# Patient Record
Sex: Female | Born: 1935 | Race: Black or African American | Hispanic: No | Smoking: Never smoker
Health system: Southern US, Community
[De-identification: ages and names within clinical notes are randomized; demographics above are authoritative.]

## PROBLEM LIST (undated history)

## (undated) DIAGNOSIS — N183 Chronic kidney disease, stage 3 unspecified: Secondary | ICD-10-CM

## (undated) DIAGNOSIS — D649 Anemia, unspecified: Secondary | ICD-10-CM

## (undated) DIAGNOSIS — I447 Left bundle-branch block, unspecified: Secondary | ICD-10-CM

## (undated) DIAGNOSIS — I38 Endocarditis, valve unspecified: Secondary | ICD-10-CM

## (undated) DIAGNOSIS — I5032 Chronic diastolic (congestive) heart failure: Secondary | ICD-10-CM

## (undated) DIAGNOSIS — L299 Pruritus, unspecified: Secondary | ICD-10-CM

## (undated) DIAGNOSIS — I10 Essential (primary) hypertension: Secondary | ICD-10-CM

## (undated) DIAGNOSIS — K219 Gastro-esophageal reflux disease without esophagitis: Secondary | ICD-10-CM

## (undated) DIAGNOSIS — E785 Hyperlipidemia, unspecified: Secondary | ICD-10-CM

## (undated) DIAGNOSIS — I639 Cerebral infarction, unspecified: Secondary | ICD-10-CM

---

## 2004-10-17 ENCOUNTER — Emergency Department: Payer: Self-pay | Admitting: General Practice

## 2005-06-06 ENCOUNTER — Ambulatory Visit: Payer: Self-pay | Admitting: Internal Medicine

## 2005-07-19 ENCOUNTER — Ambulatory Visit: Payer: Self-pay | Admitting: Internal Medicine

## 2006-04-09 ENCOUNTER — Emergency Department: Payer: Self-pay | Admitting: Unknown Physician Specialty

## 2011-04-10 ENCOUNTER — Ambulatory Visit: Payer: Self-pay | Admitting: Internal Medicine

## 2016-04-19 ENCOUNTER — Ambulatory Visit
Admission: RE | Admit: 2016-04-19 | Discharge: 2016-04-19 | Disposition: A | Discharge status: Home / Self Care | Payer: Medicare Other | Source: Ambulatory Visit | Attending: Internal Medicine | Admitting: Internal Medicine

## 2016-04-19 ENCOUNTER — Other Ambulatory Visit: Payer: Self-pay | Admitting: Internal Medicine

## 2016-04-19 DIAGNOSIS — R600 Localized edema: Secondary | ICD-10-CM | POA: Insufficient documentation

## 2019-10-29 ENCOUNTER — Emergency Department (HOSPITAL_COMMUNITY): Payer: Medicare Other

## 2019-10-29 ENCOUNTER — Encounter (HOSPITAL_COMMUNITY): Payer: Self-pay | Admitting: Emergency Medicine

## 2019-10-29 ENCOUNTER — Emergency Department (HOSPITAL_COMMUNITY)
Admission: EM | Admit: 2019-10-29 | Discharge: 2019-10-29 | Disposition: A | Discharge status: Other Acute Inpatient Hospital | Payer: Medicare Other | Attending: Emergency Medicine | Admitting: Emergency Medicine

## 2019-10-29 DIAGNOSIS — I639 Cerebral infarction, unspecified: Secondary | ICD-10-CM

## 2019-10-29 DIAGNOSIS — R29726 NIHSS score 26: Secondary | ICD-10-CM | POA: Diagnosis not present

## 2019-10-29 DIAGNOSIS — I10 Essential (primary) hypertension: Secondary | ICD-10-CM | POA: Insufficient documentation

## 2019-10-29 DIAGNOSIS — Z20822 Contact with and (suspected) exposure to covid-19: Secondary | ICD-10-CM | POA: Diagnosis not present

## 2019-10-29 DIAGNOSIS — R531 Weakness: Secondary | ICD-10-CM | POA: Diagnosis present

## 2019-10-29 DIAGNOSIS — R479 Unspecified speech disturbances: Secondary | ICD-10-CM | POA: Insufficient documentation

## 2019-10-29 LAB — CBG MONITORING, ED: Glucose-Capillary: 121 mg/dL — ABNORMAL HIGH (ref 70–99)

## 2019-10-29 LAB — DIFFERENTIAL
Abs Immature Granulocytes: 0.03 10*3/uL (ref 0.00–0.07)
Basophils Absolute: 0.1 10*3/uL (ref 0.0–0.1)
Basophils Relative: 1 %
Eosinophils Absolute: 0.2 10*3/uL (ref 0.0–0.5)
Eosinophils Relative: 2 %
Immature Granulocytes: 0 %
Lymphocytes Relative: 48 %
Lymphs Abs: 4.6 10*3/uL — ABNORMAL HIGH (ref 0.7–4.0)
Monocytes Absolute: 0.7 10*3/uL (ref 0.1–1.0)
Monocytes Relative: 7 %
Neutro Abs: 3.9 10*3/uL (ref 1.7–7.7)
Neutrophils Relative %: 42 %

## 2019-10-29 LAB — CBC
HCT: 36.8 % (ref 36.0–46.0)
Hemoglobin: 11.7 g/dL — ABNORMAL LOW (ref 12.0–15.0)
MCH: 29 pg (ref 26.0–34.0)
MCHC: 31.8 g/dL (ref 30.0–36.0)
MCV: 91.3 fL (ref 80.0–100.0)
Platelets: 337 10*3/uL (ref 150–400)
RBC: 4.03 MIL/uL (ref 3.87–5.11)
RDW: 13.1 % (ref 11.5–15.5)
WBC: 9.4 10*3/uL (ref 4.0–10.5)
nRBC: 0 % (ref 0.0–0.2)

## 2019-10-29 LAB — COMPREHENSIVE METABOLIC PANEL
ALT: 18 U/L (ref 0–44)
AST: 31 U/L (ref 15–41)
Albumin: 4 g/dL (ref 3.5–5.0)
Alkaline Phosphatase: 49 U/L (ref 38–126)
Anion gap: 14 (ref 5–15)
BUN: 25 mg/dL — ABNORMAL HIGH (ref 8–23)
CO2: 22 mmol/L (ref 22–32)
Calcium: 9.3 mg/dL (ref 8.9–10.3)
Chloride: 94 mmol/L — ABNORMAL LOW (ref 98–111)
Creatinine, Ser: 1.37 mg/dL — ABNORMAL HIGH (ref 0.44–1.00)
GFR calc Af Amer: 38 mL/min — ABNORMAL LOW (ref >60)
GFR calc non Af Amer: 33 mL/min — ABNORMAL LOW (ref >60)
Glucose, Bld: 140 mg/dL — ABNORMAL HIGH (ref 70–99)
Potassium: 4.8 mmol/L (ref 3.5–5.1)
Sodium: 130 mmol/L — ABNORMAL LOW (ref 135–145)
Total Bilirubin: 1.8 mg/dL — ABNORMAL HIGH (ref 0.3–1.2)
Total Protein: 7.1 g/dL (ref 6.5–8.1)

## 2019-10-29 LAB — PROTIME-INR
INR: 1.1 (ref 0.8–1.2)
Prothrombin Time: 13.8 seconds (ref 11.4–15.2)

## 2019-10-29 LAB — I-STAT CHEM 8, ED
BUN: 26 mg/dL — ABNORMAL HIGH (ref 8–23)
Calcium, Ion: 1.13 mmol/L — ABNORMAL LOW (ref 1.15–1.40)
Chloride: 98 mmol/L (ref 98–111)
Creatinine, Ser: 1.4 mg/dL — ABNORMAL HIGH (ref 0.44–1.00)
Glucose, Bld: 136 mg/dL — ABNORMAL HIGH (ref 70–99)
HCT: 37 % (ref 36.0–46.0)
Hemoglobin: 12.6 g/dL (ref 12.0–15.0)
Potassium: 4.6 mmol/L (ref 3.5–5.1)
Sodium: 132 mmol/L — ABNORMAL LOW (ref 135–145)
TCO2: 28 mmol/L (ref 22–32)

## 2019-10-29 LAB — APTT: aPTT: 28 seconds (ref 24–36)

## 2019-10-29 LAB — RESPIRATORY PANEL BY RT PCR (FLU A&B, COVID)
Influenza A by PCR: NEGATIVE
Influenza B by PCR: NEGATIVE
SARS Coronavirus 2 by RT PCR: NEGATIVE

## 2019-10-29 LAB — ETHANOL: Alcohol, Ethyl (B): <10 mg/dL (ref <10)

## 2019-10-29 MED ORDER — SODIUM CHLORIDE 0.9 % IV SOLN
50.0000 mL | Freq: Once | INTRAVENOUS | Status: AC
  Administered 2019-10-29: 50 mL via INTRAVENOUS

## 2019-10-29 MED ORDER — IOHEXOL 350 MG/ML SOLN
100.0000 mL | Freq: Once | INTRAVENOUS | Status: AC | PRN
  Administered 2019-10-29: 100 mL via INTRAVENOUS

## 2019-10-29 MED ORDER — ALTEPLASE (STROKE) FULL DOSE INFUSION
0.9000 mg/kg | Freq: Once | INTRAVENOUS | Status: AC
  Administered 2019-10-29: 44.5 mg via INTRAVENOUS
  Filled 2019-10-29: qty 100

## 2019-10-29 NOTE — ED Provider Notes (Addendum)
[facility] EMERGENCY DEPARTMENT Provider Note   CSN: [id number] Arrival date & time: 10/29/19  1017     History stroke  Sarah Delacruz is a 86 y.o. adult.  HPI    Patient presented to the ED for evaluation of acute stroke.  According to the EMS report the patient woke up normally this morning.  She had gotten up and was eating breakfast.  Patient had sudden onset of left-sided weakness as well as speech disturbance.  EMS activated a code stroke.  There was also concern of possible STEMI.  I personally reviewed the EKG this does not appear to be a STEMI.  PMHX . Anemia  . Cardiomyopathy, secondary (CMS-HCC)  . GERD (gastroesophageal reflux disease)  . Heart murmur, unspecified  . Hyperlipidemia  . Hypertension  . Left bundle branch block  . Valvular heart disease   Home meds: Current Outpatient Medications  Medication Sig Dispense Refill  . clotrimazole-betamethasone (LOTRISONE) 1-0.05 % cream Apply topically 2 (two) times daily 15 g 3  . cyanocobalamin (VITAMIN B12) 500 MCG tablet TAKE 1 TABLET BY MOUTH DAILY 30 tablet 5  . hydroCHLOROthiazide (MICROZIDE) 12.5 mg capsule TAKE 1 CAPSULE BY MOUTH DAILY 30 capsule 5  . hydrocortisone 2.5 % ointment Apply topically as needed.   . lovastatin (MEVACOR) 20 MG tablet TAKE 2 TABLETS BY MOUTH EVERY DAY WITH DINNER 60 tablet 5  . metoprolol succinate (TOPROL-XL) 50 MG XL tablet TAKE 1 TABLET BY MOUTH DAILY 90 tablet 1  . omeprazole (PRILOSEC) 20 MG DR capsule TAKE 1 CAPSULE BY MOUTH DAILY 90 capsule 1  . polyethylene glycol (MIRALAX) powder Take 17 g by mouth once daily Mix in 4-8ounces of fluid prior to taking.   Social and Family History  Social History reports that she has never smoked. She has never used smokeless tobacco. She reports that she does not drink alcohol.  Family History Family History  Problem Relation Age of Onset  . Cancer Mother  . Coronary Artery Disease (Blocked arteries around heart) Sister        OB History   No obstetric history on file.     History reviewed. No pertinent family history.  Social History   Tobacco Use  . Smoking status: Not on file  Substance Use Topics  . Alcohol use: Not on file  . Drug use: Not on file    Home Medications Prior to Admission medications   Not on File    Allergies    sulfa  Review of Systems   Review of Systems  All other systems reviewed and are negative.   Physical Exam Updated Vital Signs Wt 55 kg   Physical Exam Vitals and nursing note reviewed.  Constitutional:      Appearance: He is well-developed. He is ill-appearing.  HENT:     Head: Normocephalic and atraumatic.     Right Ear: External ear normal.     Left Ear: External ear normal.     Nose: No congestion or rhinorrhea.  Eyes:     General: No scleral icterus.       Right eye: No discharge.        Left eye: No discharge.     Conjunctiva/sclera: Conjunctivae normal.  Neck:     Trachea: No tracheal deviation.  Cardiovascular:     Rate and Rhythm: Normal rate and regular rhythm.  Pulmonary:     Effort: Pulmonary effort is normal. No respiratory distress.  Abdominal:     General: There  is no distension.     Tenderness: There is no abdominal tenderness.  Musculoskeletal:        General: No signs of injury.     Cervical back: No rigidity.  Skin:    General: Skin is warm and dry.     Findings: No rash.  Neurological:     Mental Status: He is alert.     Cranial Nerves: No cranial nerve deficit ( slurred speech ).     Sensory: Sensory deficit present.     Motor: No abnormal muscle tone or seizure activity.     Coordination: Coordination abnormal.     Comments: Left Sided hemiparesis, neglect, slurred speech     ED Results / Procedures / Treatments   Labs (all labs ordered are listed, but only abnormal results are displayed) Labs Reviewed  CBC - Abnormal; Notable for the following components:      Result Value   Hemoglobin 11.7 (*)    All  other components within normal limits  DIFFERENTIAL - Abnormal; Notable for the following components:   Lymphs Abs 4.6 (*)    All other components within normal limits  I-STAT CHEM 8, ED - Abnormal; Notable for the following components:   Sodium 132 (*)    BUN 26 (*)    Creatinine, Ser 1.40 (*)    Glucose, Bld 136 (*)    Calcium, Ion 1.13 (*)    All other components within normal limits  CBG MONITORING, ED - Abnormal; Notable for the following components:   Glucose-Capillary 121 (*)    All other components within normal limits  ETHANOL  PROTIME-INR  APTT  COMPREHENSIVE METABOLIC PANEL  RAPID URINE DRUG SCREEN, HOSP PERFORMED  URINALYSIS, ROUTINE W REFLEX MICROSCOPIC    EKG EKG Interpretation  Date/Time:  Wednesday October 29 2019 10:51:36 EST Ventricular Rate:  87 PR Interval:    QRS Duration: 130 QT Interval:  421 QTC Calculation: 507 R Axis:   -46 Text Interpretation: Age not entered, assumed to be  86 years old for purpose of ECG interpretation Sinus rhythm Atrial premature complexes Left bundle branch block increased t wave amplitude  since prior tracing Confirmed by Dorie Rank [phone]) on 10/29/2019 10:55:40 AM   Radiology No results found.  Procedures Procedures (including critical care time)  Medications Ordered in ED Medications  alteplase (ACTIVASE) 1 mg/mL infusion 49.5 mg (has no administration in time range)    Followed by  0.9 %  sodium chloride infusion (has no administration in time range)    ED Course  I have reviewed the triage vital signs and the nursing notes.  Pertinent labs & imaging results that were available during my care of the patient were reviewed by me and considered in my medical decision making (see chart for details).  Clinical Course as of Oct 28 1041  Wed Oct 29, 2019  1043 Code STEMI was deactivated.  Discussed with Dr. Burt Knack   [JK]    Clinical Course User Index [JK] Dorie Rank, MD   MDM Rules/Calculators/A&P                       Patient presented to the ED as a code stroke.  She was immediately met by the stroke team, with Dr. Lorraine Lax.  Plan is for IR intervention.  IR team here is caring for another patient.  Dr Aroor has arranged for transfer to [place] [place] for emergent treatment intervention.   Final Clinical Impression(s) / ED  Diagnoses Final diagnoses:  Cerebrovascular accident (CVA), unspecified mechanism ([place])      Dorie Rank, MD 10/29/19 1043      Dorie Rank, MD 10/29/19 1106

## 2019-10-29 NOTE — Consult Note (Addendum)
Requesting Physician: Dr. Lynelle Doctor    Chief Complaint: Left-sided weakness  History obtained from: EMS and Chart    HPI:                                                                                                                                       Sarah Delacruz is a 86 y.o. adult with past medical history of hyperlipidemia, hypertension, unspecified heart murmur, cardiomyopathy, valvular heart disease presents to the emergency department for sudden onset left-sided weakness.  Last known normal was 9 AM this morning.  According to her daughter, she was having her breakfast at 9 AM.  Shortly after when she went to check on her, she seemed to be not acting right and called EMS.  When EMS arrived they noted patient had left-sided weakness, right-sided gaze deviation and brought her to [facility] [address] as a code stroke.   ED course  On arrival.  NIH stroke scale was 24.  Blood pressure was 160 systolic.EKG reviewed by EDP, was abnormal but not concerning for STEMI.  CT head showed aspects of 8.  Patient was given IV TPA after discussion with 2 daughters over the phone.  CT angiogram was subsequently performed showed occlusion of both superior and inferior division M2  right MCA. CT perfusion showed a 14 mm core of penumbra of 79 mL.  Due to in availability of IR due to another case on the table, case was discussed with Dr. Lanna Poche at [facility] and patient was transferred for emergent mechanical thrombectomy.  The patient has baseline, according to family-walks with a cane due to arthritis, cooks, normal conversationd   Date last known well: 10/29/2019 Time last known well: 9 AM tPA Given: Yes NIHSS: 24 Baseline MRS 2   Past medical history -Above  Family history: cancer in mother Social history: Non-smoker does not drink alcohol  Allergies: Not on File  Medications:                                                                                                                         I reviewed home medications   ROS:  14 systems reviewed and negative except above    Examination:                                                                                                      General: Appears well-developed . Psych: Affect appropriate to situation Eyes: No scleral injection HENT: No OP obstrucion Head: Normocephalic.  Cardiovascular: Normal rate and regular rhythm. Respiratory: Effort normal and breath sounds normal to anterior ascultation GI: Soft.  No distension. There is no tenderness.  Skin: WDI    Neurological Examination Mental Status: Alert, not oriented to age or month.  Oriented to person.  Follows simple commands.  Dysarthric. Cranial Nerves: II: Visual fields: Left homonymous hemianopsia III,IV, VI: ptosis not present, right-sided gaze, pupils equal, round, reactive to light and accommodation V,VII: Left facial droop, XII: midline tongue extension Motor: Right : Upper extremity   4/5    Left:     Upper extremity   0/5  Lower extremity   3/5     Lower extremity   0/5 Tone and bulk:normal tone throughout; no atrophy noted Sensory: Withdraws to noxious stimulus on the right side, neglects left side Plantars: Right: downgoing   Left: downgoing Cerebellar: No obvious ataxia on the right side Gait: Unable to walk     Lab Results: Basic Metabolic Panel: Recent Labs  Lab 10/29/19 1023 10/29/19 1027  NA 130* 132*  K 4.8 4.6  CL 94* 98  CO2 22  --   GLUCOSE 140* 136*  BUN 25* 26*  CREATININE 1.37* 1.40*  CALCIUM 9.3  --     CBC: Recent Labs  Lab 10/29/19 1023 10/29/19 1027  WBC 9.4  --   NEUTROABS 3.9  --   HGB 11.7* 12.6  HCT 36.8 37.0  MCV 91.3  --   PLT 337  --     Coagulation Studies: Recent Labs    10/29/19 1023  LABPROT 13.8  INR 1.1    Imaging: CT HEAD CODE STROKE WO  CONTRAST  Result Date: 10/29/2019 CLINICAL DATA:  Code stroke. Left-sided weakness. Last known normal 9am. EXAM: CT HEAD WITHOUT CONTRAST TECHNIQUE: Contiguous axial images were obtained from the base of the skull through the vertex without intravenous contrast. COMPARISON:  No pertinent prior studies available for comparison. FINDINGS: Brain: There is cortical/subcortical hypodensity within the posterosuperior right temporal lobe consistent with acute/early subacute infarction. Small chronic appearing cortically based infarct within the mid right frontal lobe (series 3, image 23). Additional small cortically based infarct within the left parietal lobe (series 3, image 21). Chronic lacunar infarct within the right cerebellum. There is no evidence of acute intracranial hemorrhage. There is no evidence of intracranial mass. No midline shift or extra-axial fluid collection. Moderate/advanced ill-defined hypoattenuation within the cerebral white matter is nonspecific, but consistent with chronic small vessel ischemic disease. Moderate generalized parenchymal atrophy. Vascular: No hyperdense vessel.  Atherosclerotic calcifications. Skull: Normal. Negative for fracture or focal lesion. Sinuses/Orbits: Rightward gaze. Mild paranasal sinus mucosal thickening. No significant mastoid effusion ASPECTS [facility] Stroke Program Early CT Score) - Ganglionic level infarction (caudate,  lentiform nuclei, internal capsule, insula, M1-M3 cortex): 5 (points deducted for involvement of the M2 and M3 regions) - Supraganglionic infarction (M4-M6 cortex): 3 Total score (0-10 with 10 being normal): 8 These results were called by telephone at the time of interpretation on 10/29/2019 at 10:52 am to provider Dr. Laurence Slate, who verbally acknowledged these results. IMPRESSION: Acute/early subacute ischemic infarction within the right MCA vascular territory involving the posterosuperior temporal lobe. ASPECTS 8. Small chronic cortically based infarcts  within the mid right frontal lobe and left parietal lobe. Moderate to advanced chronic small vessel ischemic disease. Chronic appearing lacunar infarct within the right cerebellum. Electronically Signed   By: Jackey Loge DO   On: 10/29/2019 10:52     ASSESSMENT AND PLAN  86 y.o. adult with past medical history of hyperlipidemia, hypertension, unspecified heart murmur, cardiomyopathy, valvular heart disease presents to the emergency department for sudden onset left-sided weakness secondary to right MCA stroke due to large vessel occlusion-CT angiogram demonstrating both superior and inferior division branches of MCA occluded.   Plan Patient received IV TPA.  Due to another case on the table, patient was transferred to [facility] for emergent mechanical thrombectomy.    This patient is neurologically critically ill due right MCA stroke.  She is at risk for significant risk of neurological worsening from cerebral edema,  death from brain herniation, heart failure, hemorrhagic conversion, infection, respiratory failure and seizure. This patient's care requires constant monitoring of vital signs, hemodynamics, respiratory and cardiac monitoring, review of multiple databases, neurological assessment, discussion with family, other specialists and medical decision making of high complexity.  I spent 40  minutes of neurocritical time in the care of this patient.     Sushanth Aroor [facility] Pager Number [phone]

## 2019-10-29 NOTE — Code Documentation (Addendum)
86 yo female coming from home where she was with her family this morning. Pt was reported drinking coffee when she had a sudden onset of left sided weakness and right gaze. EMS called and activated a Code Stroke.   Stroke Team met patient upon arrival to the ED. Labs drawn and EDP cleared. CT Head completed and showed no hemorrhage. Initial NIHSS 26 - See Stroke Timeline for details. Verbal consent received by Aroor over the phone. 2nd IV started and tPA started at 1036. BP prior to tPA: 161/63. See MAR.    CTA/CTP completed and showed superior and inferior Right MCA M2. Pt needed to be transferred to [facility]. MD [facility] called at 1038. Approved transfer. [facility] called at 1042. Reported no trucks available at this time, but one would arrive at 1100. [employer] administrator called at [employer].   [facility] arrived at 28. Assessments and VS completed per protocol with bedside handoff. Post-tPA flush hung and patient transported to [facility]. EMTALA, Facesheet, and ED Record given to [facility]. [facility] left with patient at 1114.   Report given to Hildred Alamin, Therapist, sports at [employer].

## 2019-10-29 NOTE — Progress Notes (Signed)
Pharmacist Code Stroke Response  Notified to mix tPA at 1030 by Dr. Laurence Slate Delivered tPA to RN at 1033  tPA dose = 5mg  bolus over 1 minute followed by 44.5mg  for a total dose of 49.5mg  over 1 hour  Issues/delays encountered (if applicable): none   , PharmD, BCPS Please check AMION for all [facility] contact numbers Clinical Pharmacist 10/29/2019 10:40 AM

## 2019-11-04 ENCOUNTER — Encounter: Payer: Self-pay | Admitting: *Deleted

## 2019-11-04 MED ORDER — PRAVASTATIN SODIUM 40 MG PO TABS
40.00 | ORAL_TABLET | ORAL | Status: DC
Start: 2019-11-04 — End: 2019-11-04

## 2019-11-04 MED ORDER — DOCUSATE SODIUM 150 MG/15ML PO LIQD
100.00 | ORAL | Status: DC
Start: 2019-11-04 — End: 2019-11-04

## 2019-11-04 MED ORDER — ASPIRIN 325 MG PO TABS
325.00 | ORAL_TABLET | ORAL | Status: DC
Start: 2019-11-05 — End: 2019-11-04

## 2019-11-04 MED ORDER — SODIUM CHLORIDE 0.9 % IV SOLN
10.00 | INTRAVENOUS | Status: DC
Start: ? — End: 2019-11-04

## 2019-11-04 MED ORDER — LABETALOL HCL 5 MG/ML IV SOLN
10.00 | INTRAVENOUS | Status: DC
Start: ? — End: 2019-11-04

## 2019-11-04 MED ORDER — HYDRALAZINE HCL 10 MG PO TABS
10.00 | ORAL_TABLET | ORAL | Status: DC
Start: 2019-11-04 — End: 2019-11-04

## 2019-11-04 MED ORDER — METOPROLOL TARTRATE 25 MG PO TABS
12.50 | ORAL_TABLET | ORAL | Status: DC
Start: 2019-11-04 — End: 2019-11-04

## 2019-11-04 MED ORDER — ONDANSETRON HCL 4 MG/2ML IJ SOLN
4.00 | INTRAMUSCULAR | Status: DC
Start: ? — End: 2019-11-04

## 2019-11-04 MED ORDER — SENNOSIDES 8.8 MG/5ML PO SYRP
5.00 | ORAL_SOLUTION | ORAL | Status: DC
Start: 2019-11-04 — End: 2019-11-04

## 2019-11-04 MED ORDER — HYDRALAZINE HCL 20 MG/ML IJ SOLN
10.00 | INTRAMUSCULAR | Status: DC
Start: ? — End: 2019-11-04

## 2019-11-04 MED ORDER — SODIUM CHLORIDE 0.9 % IV SOLN
500.00 | INTRAVENOUS | Status: DC
Start: ? — End: 2019-11-04

## 2019-11-04 MED ORDER — GENERIC EXTERNAL MEDICATION
62.50 | Status: DC
Start: 2019-11-06 — End: 2019-11-04

## 2019-11-04 MED ORDER — POLYETHYLENE GLYCOL 3350 17 GM/SCOOP PO POWD
17.00 | ORAL | Status: DC
Start: 2019-11-05 — End: 2019-11-04

## 2019-11-04 MED ORDER — HEPARIN SODIUM (PORCINE) 5000 UNIT/ML IJ SOLN
5000.00 | INTRAMUSCULAR | Status: DC
Start: 2019-11-05 — End: 2019-11-04

## 2019-11-04 MED ORDER — PAROXETINE HCL 20 MG PO TABS
20.00 | ORAL_TABLET | ORAL | Status: DC
Start: 2019-11-05 — End: 2019-11-04

## 2019-11-04 MED ORDER — SODIUM CHLORIDE 0.9 % IV SOLN
50.00 | INTRAVENOUS | Status: DC
Start: ? — End: 2019-11-04

## 2019-11-04 MED ORDER — GENERIC EXTERNAL MEDICATION
Status: DC
Start: ? — End: 2019-11-04

## 2019-11-04 NOTE — PMR Pre-admission (Signed)
PMR Admission Coordinator Pre-Admission Assessment  Patient: Sarah Delacruz is an 86 y.o., adult MRN: [id number] DOB: [birth year] Height: 5\' 6"  (1.676 m) Weight: 55 kg  Insurance Information HMO:     PPO:      PCP:      IPA:      80/20:      OTHER:  PRIMARY: Medicare a and b      Policy#: [id number]      Subscriber: pt Benefits:  Phone #: passport one online     Name: 3/9 Eff. Date: 05/29/1991     Deduct: $1484      Out of Pocket Max: none      Life Max: none CIR: 100%      SNF: 20 full days Outpatient: 80%     Co-Pay: 20% Home Health: 100%      Co-Pay: none DME: 80%     Co-Pay: 20% Providers: pt choice  SECONDARY: Medicaid [place] Access      Policy#: 07/29/1991 L      Subscriber: pt Benefits:  Phone #: passport one online     Name: 3/9 Eff. Date: active MAAQN       Medicaid Application Date:       Case Manager:  Disability Application Date:       Case Worker:   The "Data Collection Information Summary" for patients in Inpatient Rehabilitation Facilities with attached "Privacy Act Statement-Health Care Records" was provided and verbally reviewed with: Patient and Family  Emergency Contact Information 5/9 " Diane daughter    Home [phone]   Cell [phone]  [phone]    Granddaughter   Cell [phone]  Current Medical History  Patient Admitting Diagnosis: CVA  History of Present Illness: 86 year old female admitted to [facility] ED 10/29/2019 with a right MCA CVA, evaluated by neurologist in ED with TPA given. That morning ate breakfast and about 9 am developed symptoms. In ED after TPA bolus, CT angio demonstrated a distal right M1 occlusion occluding both M2 branches proximally. Transferred to [facility] ED for intervention.  S/p Thrombectomy with Dr. [facility] on 3/3. 24 hr post TPA head CT without hemorrhage. MRI brain showed R MCA inferior division infarct without hemorrhage. Aspirin started 325 mg daily and transition to 81 mg at discharge. Increased Paxil 20 mg  daily.  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 Echo good EF with severe MR and AR. LDL 74 TG 49. Started Pravachol ( on Lovastatin at home).   Dysphagia with tube feeds via NG tube initially for failed MBS on 3/5. Repeat MBS on 3/9. Moderate oropharyngeal dysphagia with overall improvement since MBS 3/5. Recommended pureed diet with honey thick liquids. Meds crushed with puree.   Chronic iron panel Iron 23.8, Iron sat 11 % and Ferrlecit 62.5 mg IV for 5 doses  VTE prophylaxis with sequential compression device and heparin sq.   Patient's medical record from [facility] has been reviewed by the rehabilitation admission coordinator and physician.  Past Medical History  Hypertension Hyperlipidemia anemia  Family History   family history is not on file.  Prior Rehab/Hospitalizations Has the patient had prior rehab or hospitalizations prior to admission? Yes  Has the patient had major surgery during 100 days prior to admission? Yes   Current Medications Asa  Colace heparin sq Mupirocin Paroxetine Pravastatin sodium  Patients Current Diet:   MBS repeated 3/9 pureed diet with honey thick liquids and meds crushed with  puree. NGT removed on 3/9  Precautions / Restrictions Fall and swallow precautions  Has the patient had 2 or more falls or a fall with injury in the past year? No  Prior Activity Level Mod I with cane; Independent adls; did not drive  Prior Functional Level Self Care: Did the patient need help bathing, dressing, using the toilet or eating? Independent  Indoor Mobility: Did the patient need assistance with walking from room to room (with or without device)? Independent  Stairs: Did the patient need assistance with internal or external stairs (with or without device)? Independent  Functional Cognition: Did the patient need help planning regular tasks such as shopping or remembering to take  medications? Independent  Home Assistive Devices / Equipment    Prior Device Use: Indicate devices/aids used by the patient prior to current illness, exacerbation or injury? cane   Prior Functional Level Current Functional Level  Bed Mobility   Independent Right gaze deviation Supine to sit with max assist, extra time, cues for BLE sequence, max assist with trunk elevation. Sit to supine with +2 max assist with +2 dependent   Transfers   Independent  Sit to stand 2 trials with verbal /manual cues for right hand and feet placements.    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 Left neglect/inattention; unable to take steps  Upper Body Dressing   Independent  max   Lower Body Dressing   Independent  total   Grooming   Independent  mod   Eating/Drinking   Independent  mod   Toilet Transfer   Independent  max   Bladder Continence   continent continent   Bowel Management  continent  Continent LBM 3/9   Stair Climbing   Independent  Not attempted  Communication   Independent   Dysarthria   Memory   Intact; I with med management 75 % intelligible with max verbal cues. Independently oriented to person and place.   Driving   no     Special needs/care consideration BiPAP/CPAP  CPM  Continuous Drip IV  Dialysis         Life Vest  Oxygen  Special Bed  Trach Size  Wound Vac  Skin                               Bowel mgmt: continent LBM 3/9 Bladder mgmt: continent Diabetic mgmt: Hgb A1c 5.9 Behavioral consideration  Chemo/radiation  Designated visitor is Cecethia " Diane"   Previous Home Environment  She and daughter Cecethia "Diane" live together. 2 level home with patient's bed and bath downstairs. Back door entry is 3 step with rail on the right. Front entry 2 steps without rail. Diane and her two sisters can provide 24/7 min assist level. Granddaughter, Floydene Flock assists in coordinating care with her 3 aunts for her grandmother. Anjail has applied for PCS services on the waiting list and has spoken with [facility]. Anjail has Home Health back ground.  Discharge Living Setting Same as pta  Social/Family/Support Systems Patient and her daughter Ronney Asters "Diane" live together. Other daughter  Peter Congo and Vicente Males also involved. Granddaughter, Floydene Flock with [place] [facility] and assist in planning. Patient was Mod I with Cane pta and active  Goals/Additional Needs Min PT and OT, supervision SLP ELOS 2 to 3 weeks  Decrease burden of  Care through IP rehab admission: n/a  Possible need for SNF placement upon discharge: not anticipated  Patient Condition: I have reviewed medical records from [facility] , spoken with CM, and patient, daughter and family member. I discussed via phone for inpatient rehabilitation assessment.  Patient will benefit from ongoing PT, OT and SLP, can actively participate in 3 hours of therapy a day 5 days of the week, and can make measurable gains during the admission.  Patient will also benefit from the coordinated team approach during an Inpatient Acute Rehabilitation admission.  The patient will receive intensive therapy as well as Rehabilitation physician, nursing, social worker, and care management interventions.  Due to bladder management, bowel management, safety, skin/wound care, disease management, medication administration, pain management and patient education the patient requires 24 hour a day rehabilitation nursing.  The patient is currently max assist with mobility and basic ADLs.  Discharge setting and therapy post discharge at home with home health is anticipated.  Patient has agreed to participate in the Acute Inpatient Rehabilitation Program and will admit today.  Preadmission Screen Completed By:  Clois Dupes, 11/05/2019 10:04  AM ______________________________________________________________________   Discussed status with Dr. Allena Katz  on  11/05/2019 at  1000 and received approval for admission today.  Admission Coordinator:  Clois Dupes, RN, time  1000 Date  11/05/2019   Assessment/Plan: Diagnosis: Right MCA CVA  1. Does the need for close, 24 hr/day Medical supervision in concert with the patient's rehab needs make it unreasonable for this patient to be served in a less intensive setting? Yes  2. Co-Morbidities requiring supervision/potential complications: HTN (monitor and provide prns in accordance with increased physical exertion and pain), Hyperlipidemia, anemia (repeat labs, consider transfusion if necessary to ensure appropriate perfusion for increased activity tolerance 3. Due to bladder management, bowel management, safety, disease management and patient education, does the patient require 24 hr/day rehab nursing? Yes 4. Does the patient require coordinated care of a physician, rehab nurse, PT, OT, and SLP to address physical and functional deficits in the context of the above medical diagnosis(es)? Yes Addressing deficits in the following areas: balance, endurance, locomotion, strength, transferring, bathing, dressing, toileting, speech, swallowing and psychosocial support 5. Can the patient actively participate in an intensive therapy program of at least 3 hrs of therapy 5 days a week? Yes 6. The potential for patient to make measurable gains while on inpatient rehab is excellent 7. Anticipated functional outcomes upon discharge from inpatient rehab: min assist PT, min assist and mod assist OT, min assist SLP 8. Estimated rehab length of stay to reach the above functional goals is: 20-24 days. 9. Anticipated discharge destination: Home 10. Overall Rehab/Functional Prognosis: good  MD Signature Maryla Morrow, MD, ABPMR

## 2019-11-05 ENCOUNTER — Inpatient Hospital Stay (HOSPITAL_COMMUNITY)
Admission: RE | Admit: 2019-11-05 | Discharge: 2019-12-05 | DRG: 057 | Disposition: A | Discharge status: Home Health | Payer: Medicare Other | Source: Other Acute Inpatient Hospital | Attending: Physical Medicine & Rehabilitation | Admitting: Physical Medicine & Rehabilitation

## 2019-11-05 ENCOUNTER — Other Ambulatory Visit: Payer: Self-pay

## 2019-11-05 ENCOUNTER — Encounter (HOSPITAL_COMMUNITY): Payer: Self-pay | Admitting: Physical Medicine & Rehabilitation

## 2019-11-05 DIAGNOSIS — I5032 Chronic diastolic (congestive) heart failure: Secondary | ICD-10-CM | POA: Diagnosis not present

## 2019-11-05 DIAGNOSIS — R011 Cardiac murmur, unspecified: Secondary | ICD-10-CM | POA: Diagnosis present

## 2019-11-05 DIAGNOSIS — I639 Cerebral infarction, unspecified: Secondary | ICD-10-CM

## 2019-11-05 DIAGNOSIS — R0989 Other specified symptoms and signs involving the circulatory and respiratory systems: Secondary | ICD-10-CM

## 2019-11-05 DIAGNOSIS — Z882 Allergy status to sulfonamides status: Secondary | ICD-10-CM | POA: Diagnosis not present

## 2019-11-05 DIAGNOSIS — I69354 Hemiplegia and hemiparesis following cerebral infarction affecting left non-dominant side: Principal | ICD-10-CM

## 2019-11-05 DIAGNOSIS — R1312 Dysphagia, oropharyngeal phase: Secondary | ICD-10-CM | POA: Diagnosis present

## 2019-11-05 DIAGNOSIS — I63511 Cerebral infarction due to unspecified occlusion or stenosis of right middle cerebral artery: Secondary | ICD-10-CM | POA: Diagnosis present

## 2019-11-05 DIAGNOSIS — I69391 Dysphagia following cerebral infarction: Secondary | ICD-10-CM | POA: Diagnosis not present

## 2019-11-05 DIAGNOSIS — D631 Anemia in chronic kidney disease: Secondary | ICD-10-CM | POA: Diagnosis present

## 2019-11-05 DIAGNOSIS — I447 Left bundle-branch block, unspecified: Secondary | ICD-10-CM | POA: Diagnosis present

## 2019-11-05 DIAGNOSIS — N183 Chronic kidney disease, stage 3 unspecified: Secondary | ICD-10-CM | POA: Diagnosis present

## 2019-11-05 DIAGNOSIS — I130 Hypertensive heart and chronic kidney disease with heart failure and stage 1 through stage 4 chronic kidney disease, or unspecified chronic kidney disease: Secondary | ICD-10-CM | POA: Diagnosis present

## 2019-11-05 DIAGNOSIS — R339 Retention of urine, unspecified: Secondary | ICD-10-CM | POA: Diagnosis present

## 2019-11-05 DIAGNOSIS — I10 Essential (primary) hypertension: Secondary | ICD-10-CM

## 2019-11-05 DIAGNOSIS — Z8249 Family history of ischemic heart disease and other diseases of the circulatory system: Secondary | ICD-10-CM

## 2019-11-05 DIAGNOSIS — Z7982 Long term (current) use of aspirin: Secondary | ICD-10-CM

## 2019-11-05 DIAGNOSIS — I959 Hypotension, unspecified: Secondary | ICD-10-CM | POA: Diagnosis not present

## 2019-11-05 DIAGNOSIS — G8194 Hemiplegia, unspecified affecting left nondominant side: Secondary | ICD-10-CM

## 2019-11-05 DIAGNOSIS — E785 Hyperlipidemia, unspecified: Secondary | ICD-10-CM | POA: Diagnosis present

## 2019-11-05 DIAGNOSIS — I69322 Dysarthria following cerebral infarction: Secondary | ICD-10-CM | POA: Diagnosis not present

## 2019-11-05 DIAGNOSIS — Z809 Family history of malignant neoplasm, unspecified: Secondary | ICD-10-CM | POA: Diagnosis not present

## 2019-11-05 DIAGNOSIS — H518 Other specified disorders of binocular movement: Secondary | ICD-10-CM | POA: Diagnosis present

## 2019-11-05 DIAGNOSIS — N1831 Chronic kidney disease, stage 3a: Secondary | ICD-10-CM | POA: Diagnosis not present

## 2019-11-05 DIAGNOSIS — K5909 Other constipation: Secondary | ICD-10-CM | POA: Diagnosis not present

## 2019-11-05 DIAGNOSIS — K219 Gastro-esophageal reflux disease without esophagitis: Secondary | ICD-10-CM | POA: Diagnosis present

## 2019-11-05 HISTORY — DX: Chronic diastolic (congestive) heart failure: I50.32

## 2019-11-05 HISTORY — DX: Left bundle-branch block, unspecified: I44.7

## 2019-11-05 HISTORY — DX: Hyperlipidemia, unspecified: E78.5

## 2019-11-05 HISTORY — DX: Anemia, unspecified: D64.9

## 2019-11-05 HISTORY — DX: Gastro-esophageal reflux disease without esophagitis: K21.9

## 2019-11-05 HISTORY — DX: Essential (primary) hypertension: I10

## 2019-11-05 HISTORY — DX: Pruritus, unspecified: L29.9

## 2019-11-05 HISTORY — DX: Chronic kidney disease, stage 3 unspecified: N18.30

## 2019-11-05 HISTORY — DX: Endocarditis, valve unspecified: I38

## 2019-11-05 MED ORDER — POLYETHYLENE GLYCOL 3350 17 G PO PACK
17.0000 g | PACK | Freq: Every day | ORAL | Status: DC | PRN
  Administered 2019-11-29 – 2019-12-03 (×2): 17 g via ORAL
  Filled 2019-11-05 (×2): qty 1

## 2019-11-05 MED ORDER — METOPROLOL TARTRATE 12.5 MG HALF TABLET
12.5000 mg | ORAL_TABLET | Freq: Two times a day (BID) | ORAL | Status: DC
  Administered 2019-11-05 – 2019-11-18 (×26): 12.5 mg via ORAL
  Filled 2019-11-05 (×26): qty 1

## 2019-11-05 MED ORDER — DIPHENHYDRAMINE HCL 12.5 MG/5ML PO ELIX: 12.5000 mg | ORAL_SOLUTION | Freq: Four times a day (QID) | ORAL | Status: DC | PRN

## 2019-11-05 MED ORDER — PROCHLORPERAZINE 25 MG RE SUPP: 12.5000 mg | Freq: Four times a day (QID) | RECTAL | Status: DC | PRN

## 2019-11-05 MED ORDER — PROCHLORPERAZINE MALEATE 5 MG PO TABS
5.0000 mg | ORAL_TABLET | Freq: Four times a day (QID) | ORAL | Status: DC | PRN
  Administered 2019-11-21: 5 mg via ORAL
  Filled 2019-11-05: qty 1

## 2019-11-05 MED ORDER — LIDOCAINE HCL URETHRAL/MUCOSAL 2 % EX GEL: CUTANEOUS | Status: DC | PRN

## 2019-11-05 MED ORDER — DOCUSATE SODIUM 150 MG/15ML PO LIQD
100.00 | ORAL | Status: DC
Start: 2019-11-05 — End: 2019-11-05

## 2019-11-05 MED ORDER — SENNOSIDES 8.8 MG/5ML PO SYRP
5.00 | ORAL_SOLUTION | ORAL | Status: DC
Start: 2019-11-05 — End: 2019-11-05

## 2019-11-05 MED ORDER — GENERIC EXTERNAL MEDICATION
Status: DC
Start: ? — End: 2019-11-05

## 2019-11-05 MED ORDER — CHOLECALCIFEROL 25 MCG (1000 UT) PO TABS
1000.00 | ORAL_TABLET | ORAL | Status: DC
Start: 2019-11-06 — End: 2019-11-05

## 2019-11-05 MED ORDER — BISACODYL 10 MG RE SUPP
10.0000 mg | Freq: Every day | RECTAL | Status: DC | PRN
  Administered 2019-11-17 – 2019-11-29 (×2): 10 mg via RECTAL
  Filled 2019-11-05 (×2): qty 1

## 2019-11-05 MED ORDER — TRAZODONE HCL 50 MG PO TABS: 25.0000 mg | ORAL_TABLET | Freq: Every evening | ORAL | Status: DC | PRN

## 2019-11-05 MED ORDER — GERHARDT'S BUTT CREAM
1.0000 "application " | TOPICAL_CREAM | Freq: Two times a day (BID) | CUTANEOUS | Status: DC
  Administered 2019-11-05 – 2019-12-05 (×57): 1 via TOPICAL
  Filled 2019-11-05 (×3): qty 1

## 2019-11-05 MED ORDER — ENOXAPARIN SODIUM 40 MG/0.4ML ~~LOC~~ SOLN: 40.0000 mg | SUBCUTANEOUS | Status: DC

## 2019-11-05 MED ORDER — ASPIRIN 81 MG PO CHEW
324.0000 mg | CHEWABLE_TABLET | Freq: Every day | ORAL | Status: DC
  Administered 2019-11-06 – 2019-12-05 (×30): 324 mg via ORAL
  Filled 2019-11-05 (×31): qty 4

## 2019-11-05 MED ORDER — PROCHLORPERAZINE EDISYLATE 10 MG/2ML IJ SOLN: 5.0000 mg | Freq: Four times a day (QID) | INTRAMUSCULAR | Status: DC | PRN

## 2019-11-05 MED ORDER — FLEET ENEMA 7-19 GM/118ML RE ENEM
1.0000 | ENEMA | Freq: Once | RECTAL | Status: AC | PRN
  Administered 2019-11-21: 1 via RECTAL
  Filled 2019-11-05: qty 1

## 2019-11-05 MED ORDER — ENOXAPARIN SODIUM 30 MG/0.3ML ~~LOC~~ SOLN
30.0000 mg | SUBCUTANEOUS | Status: DC
  Administered 2019-11-05 – 2019-12-04 (×30): 30 mg via SUBCUTANEOUS
  Filled 2019-11-05 (×30): qty 0.3

## 2019-11-05 MED ORDER — HYDRALAZINE HCL 10 MG PO TABS
10.0000 mg | ORAL_TABLET | Freq: Three times a day (TID) | ORAL | Status: DC
  Administered 2019-11-05 – 2019-11-10 (×14): 10 mg via ORAL
  Filled 2019-11-05 (×14): qty 1

## 2019-11-05 MED ORDER — PAROXETINE HCL 20 MG PO TABS
30.00 | ORAL_TABLET | ORAL | Status: DC
Start: 2019-11-06 — End: 2019-11-05

## 2019-11-05 MED ORDER — MEGESTROL ACETATE 40 MG/ML PO SUSP
400.00 | ORAL | Status: DC
Start: 2019-11-05 — End: 2019-11-05

## 2019-11-05 MED ORDER — GUAIFENESIN-DM 100-10 MG/5ML PO SYRP
5.0000 mL | ORAL_SOLUTION | Freq: Four times a day (QID) | ORAL | Status: DC | PRN
  Administered 2019-11-15: 10 mL via ORAL
  Filled 2019-11-05: qty 10

## 2019-11-05 MED ORDER — ASPIRIN 325 MG PO TABS
325.00 | ORAL_TABLET | ORAL | Status: DC
Start: 2019-11-06 — End: 2019-11-05

## 2019-11-05 MED ORDER — PRAVASTATIN SODIUM 40 MG PO TABS
40.0000 mg | ORAL_TABLET | Freq: Every day | ORAL | Status: DC
  Administered 2019-11-05 – 2019-12-04 (×30): 40 mg via ORAL
  Filled 2019-11-05 (×30): qty 1

## 2019-11-05 MED ORDER — PRAVASTATIN SODIUM 40 MG PO TABS
40.00 | ORAL_TABLET | ORAL | Status: DC
Start: 2019-11-05 — End: 2019-11-05

## 2019-11-05 MED ORDER — PAROXETINE HCL 20 MG PO TABS
20.0000 mg | ORAL_TABLET | Freq: Every day | ORAL | Status: DC
  Administered 2019-11-06 – 2019-12-05 (×30): 20 mg via ORAL
  Filled 2019-11-05 (×31): qty 1

## 2019-11-05 MED ORDER — POLYETHYLENE GLYCOL 3350 17 GM/SCOOP PO POWD
17.00 | ORAL | Status: DC
Start: 2019-11-06 — End: 2019-11-05

## 2019-11-05 MED ORDER — HYDROCHLOROTHIAZIDE 12.5 MG PO TABS
12.50 | ORAL_TABLET | ORAL | Status: DC
Start: 2019-11-06 — End: 2019-11-05

## 2019-11-05 MED ORDER — ALUM & MAG HYDROXIDE-SIMETH 200-200-20 MG/5ML PO SUSP: 30.0000 mL | ORAL | Status: DC | PRN

## 2019-11-05 MED ORDER — HYDRALAZINE HCL 25 MG PO TABS
25.00 | ORAL_TABLET | ORAL | Status: DC
Start: 2019-11-05 — End: 2019-11-05

## 2019-11-05 MED ORDER — METOPROLOL TARTRATE 25 MG PO TABS
25.00 | ORAL_TABLET | ORAL | Status: DC
Start: 2019-11-05 — End: 2019-11-05

## 2019-11-05 MED ORDER — SENNA 8.6 MG PO TABS
2.0000 | ORAL_TABLET | Freq: Every day | ORAL | Status: DC
  Administered 2019-11-05 – 2019-12-04 (×27): 17.2 mg via ORAL
  Filled 2019-11-05 (×28): qty 2

## 2019-11-05 MED ORDER — ACETAMINOPHEN 325 MG PO TABS
325.0000 mg | ORAL_TABLET | ORAL | Status: DC | PRN
  Administered 2019-11-12 – 2019-11-19 (×5): 650 mg via ORAL
  Filled 2019-11-05 (×5): qty 2

## 2019-11-05 NOTE — Progress Notes (Signed)
Standley Brooking, RN  Rehab Admission Coordinator  Physical Medicine and Rehabilitation  PMR Pre-admission  Signed  Encounter Date:  11/04/2019      Related encounter: Documentation from 11/04/2019 in [facility] MEDICINE AND REHABILITATION      Signed        Show:Clear all [x] Manual[x] Template[] Copied  Added by: [x] , RN[x] , MD  [] Hover for details PMR Admission Coordinator Pre-Admission Assessment   Patient: Sarah Delacruz is an 86 y.o., adult MRN: DOB: [birth year] Height: 5\' 6"  (1.676 m) Weight: 55 kg   Insurance Information HMO:     PPO:      PCP:      IPA:      80/20:      OTHER:  PRIMARY: Medicare a and b      Policy#: [id number]      Subscriber: pt Benefits:  Phone #: passport one online     Name: 3/9 Eff. Date: 05/29/1991     Deduct: $1484      Out of Pocket Max: none      Life Max: none CIR: 100%      SNF: 20 full days Outpatient: 80%     Co-Pay: 20% Home Health: 100%      Co-Pay: none DME: 80%     Co-Pay: 20% Providers: pt choice  SECONDARY: Medicaid [place] Access      Policy#: L      Subscriber: pt Benefits:  Phone #: passport one online     Name: 3/9 Eff. Date: active MAAQN        Medicaid Application Date:       Case Manager:  Disability Application Date:       Case Worker:    The "Data Collection Information Summary" for patients in Inpatient Rehabilitation Facilities with attached "Privacy Act Statement-Health Care Records" was provided and verbally reviewed with: Patient and Family   Emergency Contact Information 5/9 " Diane daughter    Home [phone]   Cell [phone]   5/9    Granddaughter   Cell [phone]   Current Medical History  Patient Admitting Diagnosis: CVA   History of Present Illness: 86 year old female admitted to [facility] ED 10/29/2019 with a right MCA CVA, evaluated by neurologist in ED with TPA given. That morning ate breakfast and about 9 am developed  symptoms. In ED after TPA bolus, CT angio demonstrated a distal right M1 occlusion occluding both M2 branches proximally. Transferred to [facility] ED for intervention.   S/p Thrombectomy with Dr. 84 on 3/3. 24 hr post TPA head CT without hemorrhage. MRI brain showed R MCA inferior division infarct without hemorrhage. Aspirin started 325 mg daily and transition to 81 mg at discharge. Increased Paxil 20 mg daily.   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 Echo good EF with severe MR and AR. LDL 74 TG 49. Started Pravachol ( on Lovastatin at home).    Dysphagia with tube feeds via NG tube initially for failed MBS on 3/5. Repeat MBS on 3/9. Moderate oropharyngeal dysphagia with overall improvement since MBS 3/5. Recommended pureed diet with honey thick liquids. Meds crushed with puree.    Chronic iron panel Iron 23.8, Iron sat 11 % and Ferrlecit 62.5 mg IV for 5 doses   VTE prophylaxis with sequential compression device and heparin sq.     Patient's medical record from [facility] has been reviewed by the rehabilitation admission  coordinator and physician.   Past Medical History  Hypertension Hyperlipidemia anemia   Family History   family history is not on file.   Prior Rehab/Hospitalizations Has the patient had prior rehab or hospitalizations prior to admission? Yes   Has the patient had major surgery during 100 days prior to admission? Yes              Current Medications Asa  Colace heparin sq Mupirocin Paroxetine Pravastatin sodium   Patients Current Diet:    MBS repeated 3/9 pureed diet with honey thick liquids and meds crushed with puree. NGT removed on 3/9   Precautions / Restrictions Fall and swallow precautions   Has the patient had 2 or more falls or a fall with injury in the past year? No   Prior Activity Level Mod I with cane; Independent adls; did not drive   Prior Functional Level Self Care: Did the  patient need help bathing, dressing, using the toilet or eating? Independent   Indoor Mobility: Did the patient need assistance with walking from room to room (with or without device)? Independent   Stairs: Did the patient need assistance with internal or external stairs (with or without device)? Independent   Functional Cognition: Did the patient need help planning regular tasks such as shopping or remembering to take medications? Independent   Home Assistive Devices / Equipment   Prior Device Use: Indicate devices/aids used by the patient prior to current illness, exacerbation or injury? cane     Prior Functional Level Current Functional Level  Bed Mobility    Independent Right gaze deviation Supine to sit with max assist, extra time, cues for BLE sequence, max assist with trunk elevation. Sit to supine with +2 max assist with +2 dependent    Transfers    Independent   Sit to stand 2 trials with verbal /manual cues for right hand and feet placements.     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 Left neglect/inattention; unable to take steps  Upper Body Dressing    Independent   max    Lower Body Dressing    Independent   total    Grooming    Independent   mod    Eating/Drinking    Independent   mod    Toilet Transfer    Independent   max    Bladder Continence    continent continent    Bowel Management   continent   Continent LBM 3/9    Stair Climbing    Independent   Not attempted  Communication    Independent    Dysarthria    Memory    Intact; I with med management 75 % intelligible with max verbal cues. Independently oriented to person and place.    Driving    no        Special needs/care consideration BiPAP/CPAP  CPM  Continuous Drip IV  Dialysis         Life Vest  Oxygen  Special Bed  Trach Size  Wound Vac  Skin                                 Bowel mgmt: continent LBM 3/9 Bladder mgmt: continent Diabetic mgmt: Hgb A1c 5.9 Behavioral consideration  Chemo/radiation  Designated visitor is Cecethia " Diane"  Previous Home Environment  She and daughter Lamount Cohen "Diane" live together. 2 level home with patient's bed and bath downstairs. Back door entry is 3 step with rail on the right. Front entry 2 steps without rail. Diane and her two sisters can provide 24/7 min assist level. Granddaughter, Georgiana Spinner assists in coordinating care with her 3 aunts for her grandmother. Anjail has applied for PCS services on the waiting list and has spoken with [facility]. Anjail has Home Health back ground.   Discharge Living Setting Same as pta   Social/Family/Support Systems Patient and her daughter Leigh Aurora "Diane" live together. Other daughter  Malachi Bonds and Tobi Bastos also involved. Granddaughter, Georgiana Spinner with Home Health Background and assist in planning. Patient was Mod I with Cane pta and active   Goals/Additional Needs Min PT and OT, supervision SLP ELOS 2 to 3 weeks   Decrease burden of Care through IP rehab admission: n/a   Possible need for SNF placement upon discharge: not anticipated   Patient Condition: I have reviewed medical records from [facility] , spoken with CM, and patient, daughter and family member. I discussed via phone for inpatient rehabilitation assessment.  Patient will benefit from ongoing PT, OT and SLP, can actively participate in 3 hours of therapy a day 5 days of the week, and can make measurable gains during the admission.  Patient will also benefit from the coordinated team approach during an Inpatient Acute Rehabilitation admission.  The patient will receive intensive therapy as well as Rehabilitation physician, nursing, social worker, and care management interventions.  Due to bladder management, bowel management, safety, skin/wound care, disease management, medication administration, pain  management and patient education the patient requires 24 hour a day rehabilitation nursing.  The patient is currently max assist with mobility and basic ADLs.  Discharge setting and therapy post discharge at home with home health is anticipated.  Patient has agreed to participate in the Acute Inpatient Rehabilitation Program and will admit today.   Preadmission Screen Completed By:  Clois Dupes, 11/05/2019 10:04 AM ______________________________________________________________________   Discussed status with Dr. Allena Katz  on  11/05/2019 at  1000 and received approval for admission today.   Admission Coordinator:  Clois Dupes, RN, time  1000 Date  11/05/2019    Assessment/Plan: Diagnosis: Right MCA CVA   1. Does the need for close, 24 hr/day Medical supervision in concert with the patient's rehab needs make it unreasonable for this patient to be served in a less intensive setting? Yes  2. Co-Morbidities requiring supervision/potential complications: HTN (monitor and provide prns in accordance with increased physical exertion and pain), Hyperlipidemia, anemia (repeat labs, consider transfusion if necessary to ensure appropriate perfusion for increased activity tolerance 3. Due to bladder management, bowel management, safety, disease management and patient education, does the patient require 24 hr/day rehab nursing? Yes 4. Does the patient require coordinated care of a physician, rehab nurse, PT, OT, and SLP to address physical and functional deficits in the context of the above medical diagnosis(es)? Yes Addressing deficits in the following areas: balance, endurance, locomotion, strength, transferring, bathing, dressing, toileting, speech, swallowing and psychosocial support 5. Can the patient actively participate in an intensive therapy program of at least 3 hrs of therapy 5 days a week? Yes 6. The potential for patient to make measurable gains while on inpatient rehab is  excellent 7. Anticipated functional outcomes upon discharge from inpatient rehab: min assist PT, min assist and mod assist OT, min assist SLP 8. Estimated  rehab length of stay to reach the above functional goals is: 20-24 days. 9. Anticipated discharge destination: Home 10. Overall Rehab/Functional Prognosis: good   MD Signature Maryla Morrow, MD, ABPMR        Cosigned by: Marcello Fennel, MD at 11/05/2019 10:52 AM  Revision History

## 2019-11-05 NOTE — H&P (Addendum)
Physical Medicine and Rehabilitation Admission H&P    OX:BDZHGD with functional deficits   HPI: Sarah Delacruz is a 86 year old female with history of HTN, anemia, heart murmur who was seen in ED on 10/29/2019 with left hemiparesis, right gaze deviation.  CT head showed acute/early subacute infarct in R-MCA territory involving posterosuperior temporal lobe.  History taken from chart review and daughter due to cognition/dysarthria.  She received tPA and CTA head/neck showed 14 ml core infarct with perfusion deficits in R-MCA territory and occlusion of proximal superior and inferior division of right M2 MCA branches. She was transferred to [facility] for emergent thrombectomy  due to availability of IR there. She underwent thrombectomy by Dr. Lanna Poche and follow up MRI brain showed R-MCA inferior division infarct without hemorrhage. Cardiac echo with ejection fraction of 60 to 65% with moderate to severe MVR and moderate AVR.   She was started on ASA for secondary stroke prevention and NGT placed for nutritional  support as failed MBS. Repeat MBS done 03/09 revealing moderate oral residue and vallecular stasis with penetration during and after swallow--placed on purees with honey liquids. Paxil added for neuroactivation.  Hospital course further complicated by dysphagia. Patient with resultant left sided weakness with sensory deficits, L-HH with right inattention, pusher tendencies to the left/back, severe dysarthria.  Patient was transferred to [facility] for CIR.  Please see preadmission assessment from earlier today as well.   Review of Systems  Constitutional: Negative for chills and fever.  HENT: Negative for hearing loss and tinnitus.   Eyes: Double vision: question double vision.  Respiratory: Negative for cough and shortness of breath.   Cardiovascular: Negative for chest pain and palpitations.  Gastrointestinal: Negative for heartburn and nausea.  Genitourinary: Negative for dysuria.   Musculoskeletal: Negative for back pain and myalgias.  Skin: Negative for rash.  Neurological: Positive for sensory change, speech change, focal weakness and weakness.  Psychiatric/Behavioral: The patient is not nervous/anxious and does not have insomnia.       Past Medical History:  Diagnosis Date  . Anemia   . Bundle branch block, left   . Chronic diastolic CHF (congestive heart failure) (HCC)   . CKD (chronic kidney disease), stage III   . GERD (gastroesophageal reflux disease)   . Hyperlipidemia   . Hypertension   . Pruritus   . Valvular heart disease      History reviewed. No pertinent surgical history.    Family History  Problem Relation Age of Onset  . Cancer Mother   . Heart disease Sister      Social History:  Lives in [place] lives with her. 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  She was inependent with cane PTA.  She does not use any tobacco, smokeless tobacco, alcohol or illicit drugs.     Allergies  Allergen Reactions  . Sulfa Antibiotics      Medications Prior to Admission  Medication Sig Dispense Refill  . acetaminophen (TYLENOL) 325 MG tablet Take 650 mg by mouth every 6 (six) hours as needed for fever.    Marland Kitchen aspirin 325 MG tablet Take 325 mg by mouth daily.    . calcium-vitamin D (OSCAL WITH D) 500-200 MG-UNIT tablet Take 1 tablet by mouth.    . clotrimazole-betamethasone (LOTRISONE) cream Apply 1 application topically 2 (two) times daily.    Marland Kitchen docusate (COLACE) 50 MG/5ML liquid Take 100 mg by mouth 2 (two) times daily.    . ferrous sulfate 325 (65 FE) MG tablet Take 325  mg by mouth 2 (two) times daily with a meal.    . hydrALAZINE (APRESOLINE) 25 MG tablet Take 25 mg by mouth 3 (three) times daily.    . hydrochlorothiazide (MICROZIDE) 12.5 MG capsule Take 12.5 mg by mouth daily.    Marland Kitchen lovastatin (MEVACOR) 20 MG tablet Take 40 mg by mouth daily.    . megestrol (MEGACE) 400 MG/10ML suspension Take 400 mg by mouth 2 (two) times daily.    . metoprolol  tartrate (LOPRESSOR) 25 MG tablet Take 25 mg by mouth 2 (two) times daily.    Marland Kitchen omeprazole (PRILOSEC) 20 MG capsule Take 20 mg by mouth daily.    Marland Kitchen PARoxetine (PAXIL) 30 MG tablet Take 30 mg by mouth daily.    . polyethylene glycol (MIRALAX / GLYCOLAX) 17 g packet Take 17 g by mouth daily.    Marland Kitchen senna (SENOKOT) 176 MG/5ML SYRP Take 5 mLs by mouth at bedtime as needed for mild constipation.    . vitamin B-12 (CYANOCOBALAMIN) 500 MCG tablet Take 500 mcg by mouth daily.    . metoprolol succinate (TOPROL-XL) 50 MG 24 hr tablet Take 50 mg by mouth daily.      Drug Regimen Review  Drug regimen was reviewed and remains appropriate with no significant issues identified  Home: Lives in multilevel home with 3 STE--lives on main level   Functional History: Independent with cane PTA.  Sponge bathes.   Functional Status:  Mobility:          ADL:    Cognition:      Physical Exam: Blood pressure (!) 166/75, pulse 79, temperature 98.3 F (36.8 C), temperature source Axillary, resp. rate (!) 21. Physical Exam  Nursing note and vitals reviewed. Constitutional: She is oriented to person, place, and time. She appears well-developed and well-nourished.  Frail  HENT:  Head: Normocephalic and atraumatic.  Eyes: EOM are normal. Right eye exhibits no discharge. Left eye exhibits no discharge.  Neck: No tracheal deviation present. No thyromegaly present.  Cardiovascular: Normal rate.  Occasional extrasystoles are present.  Murmur heard. Respiratory: Effort normal. No respiratory distress.  GI: She exhibits no distension.  Musculoskeletal:     Comments: No edema or tenderness in extremities  Neurological: She is alert and oriented to person, place, and time.  Left facial weakness  Dysarthria Right gaze preference Left inattention Left visual field deficits--question diplopia.  RUE/RLE: 4 -/5 proximal distal LUE: 0/5 LLE: 3+-4 -/5 proximal to distal  Skin: Skin is warm and dry. No rash  noted. No erythema.  Psychiatric: Her speech is slurred. She is slowed.   No results found for this or any previous visit (from the past 48 hour(s)). No results found.     Medical Problem List and Plan: 1.  Left hemiparesis with sensory deficits, L-HH with right inattention, pusher tendencies to the left/back, severe dysarthria secondary to right MCA infarct.  -patient may shower  -ELOS/Goals: 18-23 days/min/mod A.  Admit to CIR 2.  Antithrombotics: -DVT/anticoagulation:  Pharmaceutical: Lovenox  -antiplatelet therapy: ASA daily.  3. Pain Management: Tylenol prn.  4. Mood: LCSW to follow for evaluation and support.   -antipsychotic agents: N/A 5. Neuropsych: This patient is capable of making decisions on her own behalf. 6. Skin/Wound Care: Routine pressure relief measure.  7. Fluids/Electrolytes/Nutrition: Monitor I/O.  CMP ordered for tomorrow a.m.--may need IVF at nights for hydration.  8. HTN: Monitor BP tid--continue hydralazine and BB  Monitor with increased mobility 9. Chronic diastolic CHF/Valvular disease: Monitor weights daily  with I/O. Continue hydralazine, metoprolol, pravastatin and ASA. No ARB/ACE due to CKD.   Daily weights 10. Dyslipidemia: On pravastatin.  11. GERD: resume PPI.  12. Chronic constipation: Continue Senna.  Adjust bowel meds as necessary. 13. CKD III: Monitor renal status with serial checks.  CMP ordered. 14. GU: Has been in since admission at [facility] per family.  Will DC foley in am and start bladder training.  Will order UA/Ucx. 15.  Post stroke dysphagia  Pure with honey thick liquids, advance diet as tolerated  Bary Leriche, PA-C 11/05/2019  I have personally performed a face to face diagnostic evaluation, including, but not limited to relevant history and physical exam findings, of this patient and developed relevant assessment and plan.  Additionally, I have reviewed and concur with the physician assistant's documentation above.  Delice Lesch, MD, ABPMR

## 2019-11-05 NOTE — Progress Notes (Signed)
Patient ID: Sarah Delacruz, female   DOB: Apr 10, 1924, 86 y.o.   MRN: [id number] Patient was admitted to 4W12B via stretcher, escorted by transport staff.  Patient verbalized understanding of rehab process including fall prevention policy, personal belongings policy, and visitation policy.  Patient appears to be in no immediate distress at this time.  Small area to coccyx appears to be from a shearing injury.  Also has some dry keratotic lesions to bilateral lower extremities (ankles).    Dani Gobble, RN

## 2019-11-06 ENCOUNTER — Inpatient Hospital Stay (HOSPITAL_COMMUNITY): Payer: Medicare Other | Admitting: Speech Pathology

## 2019-11-06 ENCOUNTER — Inpatient Hospital Stay (HOSPITAL_COMMUNITY): Payer: Medicare Other | Admitting: Physical Therapy

## 2019-11-06 ENCOUNTER — Inpatient Hospital Stay (HOSPITAL_COMMUNITY): Payer: Medicare Other | Admitting: Occupational Therapy

## 2019-11-06 DIAGNOSIS — I63511 Cerebral infarction due to unspecified occlusion or stenosis of right middle cerebral artery: Secondary | ICD-10-CM

## 2019-11-06 LAB — URINALYSIS, ROUTINE W REFLEX MICROSCOPIC
Bilirubin Urine: NEGATIVE
Glucose, UA: NEGATIVE mg/dL
Hgb urine dipstick: NEGATIVE
Ketones, ur: NEGATIVE mg/dL
Nitrite: NEGATIVE
Protein, ur: 30 mg/dL — AB
Specific Gravity, Urine: 1.018 (ref 1.005–1.030)
WBC, UA: >50 WBC/hpf — ABNORMAL HIGH (ref 0–5)
pH: 6 (ref 5.0–8.0)

## 2019-11-06 LAB — CBC WITH DIFFERENTIAL/PLATELET
Abs Immature Granulocytes: 0.45 10*3/uL — ABNORMAL HIGH (ref 0.00–0.07)
Basophils Absolute: 0.1 10*3/uL (ref 0.0–0.1)
Basophils Relative: 1 %
Eosinophils Absolute: 0.3 10*3/uL (ref 0.0–0.5)
Eosinophils Relative: 3 %
HCT: 35.9 % — ABNORMAL LOW (ref 36.0–46.0)
Hemoglobin: 11.4 g/dL — ABNORMAL LOW (ref 12.0–15.0)
Immature Granulocytes: 4 %
Lymphocytes Relative: 31 %
Lymphs Abs: 3.3 10*3/uL (ref 0.7–4.0)
MCH: 29.1 pg (ref 26.0–34.0)
MCHC: 31.8 g/dL (ref 30.0–36.0)
MCV: 91.6 fL (ref 80.0–100.0)
Monocytes Absolute: 1.1 10*3/uL — ABNORMAL HIGH (ref 0.1–1.0)
Monocytes Relative: 11 %
Neutro Abs: 5.4 10*3/uL (ref 1.7–7.7)
Neutrophils Relative %: 50 %
Platelets: 451 10*3/uL — ABNORMAL HIGH (ref 150–400)
RBC: 3.92 MIL/uL (ref 3.87–5.11)
RDW: 13.6 % (ref 11.5–15.5)
WBC: 10.6 10*3/uL — ABNORMAL HIGH (ref 4.0–10.5)
nRBC: 0 % (ref 0.0–0.2)

## 2019-11-06 LAB — COMPREHENSIVE METABOLIC PANEL
ALT: 45 U/L — ABNORMAL HIGH (ref 0–44)
AST: 42 U/L — ABNORMAL HIGH (ref 15–41)
Albumin: 2.9 g/dL — ABNORMAL LOW (ref 3.5–5.0)
Alkaline Phosphatase: 54 U/L (ref 38–126)
Anion gap: 12 (ref 5–15)
BUN: 36 mg/dL — ABNORMAL HIGH (ref 8–23)
CO2: 24 mmol/L (ref 22–32)
Calcium: 9 mg/dL (ref 8.9–10.3)
Chloride: 102 mmol/L (ref 98–111)
Creatinine, Ser: 1.31 mg/dL — ABNORMAL HIGH (ref 0.44–1.00)
GFR calc Af Amer: 41 mL/min — ABNORMAL LOW (ref >60)
GFR calc non Af Amer: 35 mL/min — ABNORMAL LOW (ref >60)
Glucose, Bld: 105 mg/dL — ABNORMAL HIGH (ref 70–99)
Potassium: 4.8 mmol/L (ref 3.5–5.1)
Sodium: 138 mmol/L (ref 135–145)
Total Bilirubin: 0.8 mg/dL (ref 0.3–1.2)
Total Protein: 6.7 g/dL (ref 6.5–8.1)

## 2019-11-06 LAB — GLUCOSE, CAPILLARY: Glucose-Capillary: 100 mg/dL — ABNORMAL HIGH (ref 70–99)

## 2019-11-06 MED ORDER — SODIUM CHLORIDE 0.9 % IV SOLN: INTRAVENOUS | Status: DC

## 2019-11-06 MED ORDER — GENERIC EXTERNAL MEDICATION
Status: DC
Start: ? — End: 2019-11-06

## 2019-11-06 MED ORDER — SODIUM CHLORIDE 0.9 % IV SOLN
INTRAVENOUS | Status: DC
  Filled 2019-11-06: qty 1000

## 2019-11-06 NOTE — Evaluation (Addendum)
Occupational Therapy Assessment and Plan  Patient Details  Name: Sarah Delacruz MRN: [id number] Date of Birth: February 14, 1924  OT Diagnosis: acute pain, ataxia, cognitive deficits, disturbance of vision, flaccid hemiplegia and hemiparesis, hemiplegia affecting non-dominant side and muscle weakness (generalized) Rehab Potential: Rehab Potential (ACUTE ONLY): Fair ELOS: 19-21 days   Today's Date: 11/06/2019 OT Individual Time: 1009-1110 OT Individual Time Calculation (min): 61 min     Problem List:  Patient Active Problem List   Diagnosis Date Noted  . Acute right MCA stroke ([place]) 11/05/2019  . Acute cerebral infarction ([place])   . Stage 3 chronic kidney disease   . Chronic constipation   . Chronic diastolic congestive heart failure ([place])   . Essential hypertension   . Dysphagia, post-stroke     Past Medical History:  Past Medical History:  Diagnosis Date  . Anemia   . Bundle branch block, left   . Chronic diastolic CHF (congestive heart failure) ([place])   . CKD (chronic kidney disease), stage III   . GERD (gastroesophageal reflux disease)   . Hyperlipidemia   . Hypertension   . Pruritus   . Valvular heart disease    Past Surgical History: History reviewed. No pertinent surgical history.  Assessment & Plan Clinical Impression: HPI: Sarah Delacruz is a 86 year old female with history of HTN, anemia, heart murmur who was seen in ED on 10/29/2019 with left hemiparesis, right gaze deviation.  CT head showed acute/early subacute infarct in R-MCA territory involving posterosuperior temporal lobe.  History taken from chart review and daughter due to cognition/dysarthria.  She received tPA and CTA head/neck showed 14 ml core infarct with perfusion deficits in R-MCA territory and occlusion of proximal superior and inferior division of right M2 MCA branches. She was transferred to [facility] for emergent thrombectomy  due to availability of IR there. She underwent thrombectomy by Dr. Arizona Constable and  follow up MRI brain showed R-MCA inferior division infarct without hemorrhage. Cardiac echo with ejection fraction of 60 to 65% with moderate to severe MVR and moderate AVR.    She was started on ASA for secondary stroke prevention and NGT placed for nutritional  support as failed MBS. Repeat MBS done 03/09 revealing moderate oral residue and vallecular stasis with penetration during and after swallow--placed on purees with honey liquids. Paxil added for neuroactivation.  Hospital course further complicated by dysphagia. Patient with resultant left sided weakness with sensory deficits, L-HH with right inattention, pusher tendencies to the left/back, severe dysarthria.  Patient was transferred to [facility] for CIR.  Please see preadmission assessment from earlier today as well.  Patient transferred to CIR on 11/05/2019.    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 Prior to hospitalization, patient could complete all ADLs, functional transfers, and mobility with Mod I and use of SPC.   Patient will benefit from skilled intervention to decrease level of assist with basic self-care skills and increase independence with basic self-care skills prior to discharge home with care partner.  Anticipate patient will require 24 hour supervision and follow up home health.  OT - End of Session Activity Tolerance: Tolerates 10 - 20 min activity with multiple  rests Endurance Deficit: Yes OT Assessment Rehab Potential (ACUTE ONLY): Fair OT Patient demonstrates  impairments in the following area(s): Balance;Cognition;Endurance;Motor;Perception;Safety;Vision;Pain OT Basic ADL's Functional Problem(s): Eating;Grooming;Bathing;Dressing;Toileting OT Transfers Functional Problem(s): Toilet;Tub/Shower OT Additional Impairment(s): Fuctional Use of Upper Extremity OT Plan OT Intensity: Minimum of 1-2 x/day, 45 to 90 minutes OT Frequency: 5 out of 7 days OT Duration/Estimated Length of Stay: 19-21 days OT Treatment/Interventions: Balance/vestibular training;Cognitive remediation/compensation;Community reintegration;Discharge planning;DME/adaptive equipment instruction;Functional electrical stimulation;Functional mobility training;Neuromuscular re-education;Pain management;Patient/family education;Self Care/advanced ADL retraining;Splinting/orthotics;Therapeutic Activities;Therapeutic Exercise;UE/LE Strength taining/ROM;UE/LE Coordination activities;Visual/perceptual remediation/compensation;Wheelchair propulsion/positioning OT Self Feeding Anticipated Outcome(s): Set-up OT Basic Self-Care Anticipated Outcome(s): Mod A OT Toileting Anticipated Outcome(s): Mod A OT Bathroom Transfers Anticipated Outcome(s): Mod A OT Recommendation Recommendations for Other Services: Speech consult;Therapeutic Recreation consult Patient destination: Home Follow Up Recommendations: Home health OT Equipment Details: TBD   Skilled Therapeutic Intervention Patient met lying supine in bed in agreement with OT eval/treatment session. Session focused on self-care retraining, functional use of LUE, improved sit<>stand, attention to L visual field, and initiation of ADL tasks. Patient able to follow 1-step verbal commands and answer orientation questions although difficult to understand secondary to dysarthria. Patient demonstrates poor static/dynamic sitting balance during UB/LB bathing/dressing seated EOB demonstrating pusher syndrome, requiring +2 assist for LB to wash perineum in  standing and Max A for UB with cueing for attention/sequencing. Total A required for threading BLE through pants. Sit <> stand from EOB with +2 assist. Total A for seated scoot toward [facility] with Bobath technique and total assist to return to supine. +2 assist required for rolling R and L in supine. Pt left lying supine in bed with bed alarm on, call bell in reach and needs met.   OT Evaluation Precautions/Restrictions  Precautions Precautions: Fall;Other (comment)(Swallow precautions.) Precaution Comments: L neglect/inattention, likely HH, impulsive, L hemi, pusher Restrictions Weight Bearing Restrictions: No General Chart Reviewed: Yes Family/Caregiver Present: No Vital Signs Therapy Vitals Temp: 98.2 F (36.8 C) Temp Source: Oral Pulse Rate: 89 Resp: 17 BP: (!) 125/52 Patient Position (if appropriate): Lying Oxygen Therapy SpO2: 98 % O2 Device: Room Air Pain Pain Assessment Pain Scale: Faces Pain Score: 0-No pain Faces Pain Scale: Hurts little more Pain Type: Acute pain;Chronic pain Pain Location: Knee Pain Orientation: Left Pain Descriptors / Indicators: Other (Comment)(Patient unable to verbalize.) Pain Onset: Unable to tell Pain Intervention(s): Repositioned;Emotional support;Elevated extremity;Rest Home Living/Prior Functioning Home Living Family/patient expects to be discharged to:: Private residence Living Arrangements: Children Available Help at Discharge: Family, Available 24 hours/day Type of Home: House Home Access: Stairs to enter [employer] of Steps: 3 steps with rail on right at back and  2 steps in front without rail Entrance Stairs-Rails: Right Home Layout: Two level, Full bath on main level, Able to live on main level with bedroom/bathroom Bathroom Shower/Tub: Tub/shower unit, Curtain(Usually sponge bathes. Could stand in shower.) Bathroom Toilet: Standard Bathroom Accessibility: Yes  Lives With: Daughter IADL History Mode of  Transportation: Family Occupation: Retired Prior Function Level of Independence: Requires assistive device for independence, Independent with basic ADLs  Able to [place]?: Yes Driving: No Vocation: Retired ADL ADL Grooming: Dependent(Honey thick liquids) Upper Body Bathing: Maximal cueing, Maximal assistance(Multimodal cueing for focused attention and sustained attention.) Where Assessed-Upper Body Bathing: Edge of bed Lower Body Bathing: Maximal cueing, Dependent(+2 helpers) Where Assessed-Lower Body Bathing: Edge of bed Upper Body Dressing: Maximal cueing, Maximal assistance Where Assessed-Upper Body Dressing: Edge of bed Lower Body Dressing: Maximal cueing, Dependent(+2 helpers) Where Assessed-Lower Body Dressing: Edge of bed  Toileting: Dependent(Incontinent brief) Toilet Transfer: Unable to assess Toilet Transfer Method: Unable to assess Tub/Shower Transfer: Unable to assess ADL Comments: Patient with poor static/dynamic sitting balance, L inattention/pusher syndrome, and need for maximal cueing for focused and sustained attention. Vision Baseline Vision/History: No visual deficits Patient Visual Report: No change from baseline(L visual field cut in RUQ and RLQ. Patient able to correctly identify number of therapists fingers presented in central vision.) Vision Assessment?: Vision impaired- to be further tested in functional context Perception  Perception: Impaired Inattention/Neglect: Does not attend to left visual field;Impaired-to be further tested in functional context Praxis Praxis: Impaired Praxis Impairment Details: Motor planning;Perseveration;Initiation Cognition Overall Cognitive Status: Impaired/Different from baseline Arousal/Alertness: Awake/alert Orientation Level: Person;Place;Situation Person: Oriented Place: Oriented Situation: Oriented Year: 2021 Month: March Day of Week: Incorrect Memory: Impaired Memory Impairment: Decreased recall of new  information;Decreased short term memory Decreased Short Term Memory: Verbal basic;Functional basic Immediate Memory Recall: Sock;Blue;Bed Memory Recall Sock: Without Cue Memory Recall Blue: Without Cue Memory Recall Bed: Without Cue Attention: Focused;Sustained Focused Attention: Impaired Sustained Attention: Impaired Sustained Attention Impairment: Verbal basic;Functional basic Selective Attention: Impaired Selective Attention Impairment: Verbal basic;Functional basic Awareness: Impaired Awareness Impairment: Intellectual impairment Problem Solving: Impaired Problem Solving Impairment: Functional basic;Verbal basic Executive Function: Sequencing;Initiating Sequencing: Impaired Sequencing Impairment: Verbal basic;Functional basic Initiating: Impaired Initiating Impairment: Verbal basic;Functional basic Behaviors: Impulsive Safety/Judgment: Impaired Sensation Sensation Light Touch: Appears Intact Coordination Gross Motor Movements are Fluid and Coordinated: No Fine Motor Movements are Fluid and Coordinated: No Coordination and Movement Description: LUE hemiparesis Finger Nose Finger Test: L UE hemiplegia, unable to follow cues for test R UE Heel Shin Test: unable to follow cues for testing Motor  Motor Motor: Hemiplegia;Abnormal tone;Abnormal postural alignment and control Motor - Skilled Clinical Observations: Increased tone in LUE at elbow. Mobility  Bed Mobility Bed Mobility: Rolling Right;Rolling Left;Supine to Sit;Sit to Supine Rolling Right: Total Assistance - Patient < 25% Rolling Left: Total Assistance - Patient < 25% Supine to Sit: Total Assistance - Patient < 25% Sit to Supine: 2 Helpers  Trunk/Postural Assessment  Cervical Assessment Cervical Assessment: Exceptions to WFL(Forward head.) Thoracic Assessment Thoracic Assessment: (Kyphotic) Lumbar Assessment Lumbar Assessment: Exceptions to WFL(Posterior pelvic tilt.) Postural Control Postural Control:  Deficits on evaluation Trunk Control: Patient with poor static/dynamic sitting balance requiring multimodal cueing as detailed in trunk/postural control section.  Balance Balance Balance Assessed: Yes Static Sitting Balance Static Sitting - Balance Support: Feet supported Static Sitting - Level of Assistance: 2: Max assist Static Sitting - Comment/# of Minutes: For completion of bathing/dressing seated EOB. Dynamic Sitting Balance Dynamic Sitting - Balance Support: Feet supported;During functional activity Dynamic Sitting - Level of Assistance: 2: Max assist Static Standing Balance Static Standing - Balance Support: During functional activity Static Standing - Level of Assistance: 1: +2 Total assist Dynamic Standing Balance Dynamic Standing - Level of Assistance: 1: +2 Total assist Extremity/Trunk Assessment RUE Assessment RUE Assessment: Within Functional Limits Passive Range of Motion (PROM) Comments: WFL Active Range of Motion (AROM) Comments: WFL at shoulder, elbow, wrist and digtis. General Strength Comments: Patient unable to follow verbal commands necessary for formal assessment but able to maintain position of RUE in space against gravity. LUE Assessment LUE Assessment: Exceptions to [facility] Passive Range of Motion (PROM) Comments: WFL; 0-90 degrees shoulder flexion with increased tone in L elbow and arthritic hands evident by Bouchard's nodes. Active Range of Motion (AROM) Comments: Not tested secondary to L hemiparesis. No active movement at shoulder, elbow, wrist, or digits.  Refer to Care Plan for Long Term Goals  Recommendations for other services: None    Discharge Criteria: Patient will be discharged from OT if patient refuses treatment 3 consecutive times without medical reason, if treatment goals not met, if there is a change in medical status, if patient makes no progress towards goals or if patient is discharged from hospital.  The above assessment, treatment  plan, treatment alternatives and goals were discussed and mutually agreed upon: by patient  Debany Vantol R Howerton-Davis 11/06/2019, 5:35 PM

## 2019-11-06 NOTE — Care Management (Signed)
[facility] Individual Statement of Services  Patient Name:  Sarah Delacruz  Date:  11/06/2019  Welcome to the [facility]patient [facility].  Our goal is to provide you with an individualized program based on your diagnosis and situation, designed to meet your specific needs.  With this comprehensive rehabilitation program, you will be expected to participate in at least 3 hours of rehabilitation therapies Monday-Friday, with modified therapy programming on the weekends.  Your rehabilitation program will include the following services:  Physical Therapy (PT), Occupational Therapy (OT), Speech Therapy (ST), 24 hour per day rehabilitation nursing, Therapeutic Recreaction (TR), Psychology, Neuropsychology, Case Management (Social Worker), Rehabilitation Medicine, Nutrition Services and Pharmacy Services  Weekly team conferences will be held on Wednesdays to discuss your progress.  Your Social Automotive engineer will talk with you frequently to get your input and to update you on team discussions.  Team conferences with you and your family in attendance may also be held.  Expected length of stay: 2-3 weeks Overall anticipated outcome: Minimal assistance  Depending on your progress and recovery, your program may change. Your Social Automotive engineer will coordinate services and will keep you informed of any changes. Your Social Worker's/Care Manager's name and contact numbers are listed  below.  The following services may also be recommended but are not provided by the Inpatient [facility]:    Home Health Rehabiltiation Services  Outpatient Rehabilitation Services   Arrangements will be made to provide these services after discharge if needed.  Arrangements include referral to agencies that provide these services.  Your insurance has been verified to be:  Medicare /Medicaid Your primary doctor is:  Barbette Reichmann, MD  Pertinent information will be shared  with your doctor and your insurance company.  Care Manager: Chana Bode, RN  [phone] or (C) [phone] Social Worker:  Cecile Sheerer, SW [phone] or [phone]  Information discussed with and copy given to patient by: Pamelia Hoit, 11/06/2019, 4:29 PM

## 2019-11-06 NOTE — Progress Notes (Signed)
Physical Therapy Session Note  Patient Details  Name: Sarah Delacruz MRN: [id number] Date of Birth: [birth year]  Today's Date: 11/06/2019 PT Individual Time: 5053-9767 PT Individual Time Calculation (min): 30 min   Short Term Goals: Week 1:  PT Short Term Goal 1 (Week 1): Will be able to perform bed mobility with ModA and HOB elevated PT Short Term Goal 2 (Week 1): Will be able to maintain dynamic sittting balance with min guard for 7 minutes PT Short Term Goal 3 (Week 1): Will be able to transfer with ModAx2  Skilled Therapeutic Interventions/Progress Updates:  Ambulation/gait training;DME/adaptive equipment instruction;Psychosocial support;UE/LE Strength taining/ROM;Balance/vestibular training;Functional electrical stimulation;Skin care/wound management;UE/LE Coordination activities;Cognitive remediation/compensation;Functional mobility training;Splinting/orthotics;Visual/perceptual remediation/compensation;Community reintegration;Stair training;Neuromuscular re-education;Wheelchair propulsion/positioning;Discharge planning;Pain management;Therapeutic Activities;Disease management/prevention;Patient/family education;Therapeutic Exercise  Pt received supine in bed, asleep but easily aroused and agreeable to therapy session. Pt reports she needs to use bathroom. Assisted with B LE management to place in hooklying and pt able to bridge and lift hips minimally to allow therapist to doff pants total assist. Pt noted to have been incontinent of bowels already. Rolling L in bed with mod assist and max multimodal cuing for sequencing and use of bedrails to assist. Therapist performed total assist brief management and peri-care requiring increased time for cleanliness. Again assisted pt with BLE management to place in hooklying and able to bridge sightly but not enough to pull pants up therefore rolled L with mod assist and R with max/total assist with max multimodal cuing for sequencing to motor plan. R  sidelying>sititng EOB with max assist for B LE management and trunk upright. Pt tolerated sitting EOB ~57minutes with initially mod assist due to posterior lean progressed to close supervision and intermittent min assist with therapist facilitating anterior trunk weight shift by having pt place her hand forward onto chair. Then progressed to dynamic sitting balance of cross body reaching (minimal challenge) with R UE while therapist facilitated L UE weightbearing and min assist for balance. Pt could have tolerated sitting EOB longer but due to time constraints returned to supine via reverse logroll technique for increased pt independence with mod assist for trunk descent and B LE management. Pt left supine in bed, HOB elevated, with needs in reach and bed alarm on. Therapist reviewed with pt how to use call bell for assistance.  Therapy Documentation Precautions:  Precautions Precautions: Fall, Other (comment)(Swallow precautions.) Precaution Comments: L neglect/inattention, likely HH, impulsive, L hemi, pusher Restrictions Weight Bearing Restrictions: No  Pain: No indications of pain throughout session.  Therapy/Group: Individual Therapy  Ginny Forth, PT, DPT 11/06/2019, 3:59 PM

## 2019-11-06 NOTE — Progress Notes (Signed)
Inpatient Rehabilitation  Patient information reviewed and entered into eRehab system by Travious Vanover M. Bransen Fassnacht, M.A., CCC/SLP, PPS Coordinator.  Information including medical coding, functional ability and quality indicators will be reviewed and updated through discharge.    

## 2019-11-06 NOTE — Evaluation (Signed)
Speech Language Pathology Assessment and Plan  Patient Details  Name: Sarah Delacruz MRN: [id number] Date of Birth: 04-30-1924  SLP Diagnosis: Dysarthria;Cognitive Impairments;Dysphagia  Rehab Potential: Lorin Picket:      Today's Date: 11/06/2019 SLP Individual Time: 6160-7371 SLP Individual Time Calculation (min): 60 min   Problem List:  Patient Active Problem List   Diagnosis Date Noted  . Acute right MCA stroke ([place]) 11/05/2019  . Acute cerebral infarction ([place])   . Stage 3 chronic kidney disease   . Chronic constipation   . Chronic diastolic congestive heart failure ([place])   . Essential hypertension   . Dysphagia, post-stroke    Past Medical History:  Past Medical History:  Diagnosis Date  . Anemia   . Bundle branch block, left   . Chronic diastolic CHF (congestive heart failure) ([place])   . CKD (chronic kidney disease), stage III   . GERD (gastroesophageal reflux disease)   . Hyperlipidemia   . Hypertension   . Pruritus   . Valvular heart disease    Past Surgical History: History reviewed. No pertinent surgical history.  Assessment / Plan / Recommendation Clinical Impression   HPI: Sarah Delacruz is a 86 year old female with history of HTN, anemia, heart murmur who was seen in ED on 10/29/2019 with left hemiparesis, right gaze deviation.  CT head showed acute/early subacute infarct in R-MCA territory involving posterosuperior temporal lobe.  History taken from chart review and daughter due to cognition/dysarthria.  She received tPA and CTA head/neck showed 14 ml core infarct with perfusion deficits in R-MCA territory and occlusion of proximal superior and inferior division of right M2 MCA branches. She was transferred to [facility] for emergent thrombectomy  due to availability of IR there. She underwent thrombectomy by Dr. Arizona Constable and follow up MRI brain showed R-MCA inferior division infarct without hemorrhage. Cardiac echo with ejection fraction of 60 to 65% with moderate to  severe MVR and moderate AVR.  She was started on ASA for secondary stroke prevention and NGT placed for nutritional  support as failed MBS. Repeat MBS done 03/09 revealing moderate oral residue and vallecular stasis with penetration during and after swallow--placed on purees with honey liquids. Paxil added for neuroactivation.  Hospital course further complicated by dysphagia. Patient with resultant left sided weakness with sensory deficits, L-HH with right inattention, pusher tendencies to the left/back, severe dysarthria.  Patient was transferred to [facility] for CIR - admitted 11/05/19 and SLP evaluations were completed 11/06/19 with results as follows:  Bedside swallow evaluation:  Pt presents with moderately severe oropharyngeal dysphagia. Overall very weak oral manipulation and delayed transit of both pureed solids and honey thick liquid boluses noted. Left anterior loss of liquids and solids. Moderate left buccal pocketing of puree required Max A cueing from SLP to clear. Pt also exhibited intermittent throat clearing following cup sips of honey thick liquids. Given restults of MBSS 3/9, no thin liquids were trialed at bedside today due to aspiration risk. Recommend continue current Dys 1 (puree), honey thick liquid diet with full supervision to assist with self feeding as needed and ensure use of swallow strategies.   Cognitive-Linguistic Evaluation: Pt presents with moderately severe cognitive-linguistic impairments. Pt's dysarthria is characterized by imprecise consonants and incomplete contact of articulators during speech tasks. Speech intelligibility is reduced to ~50% at the phrase level. Pt's expressive and receptive language appears [facility], although delayed process and initiation noted. Pt's cognitive deficits are marked primarly by lack of intellectual awareness (unable to identify any  cognitive deficits and unaware of left sided weakness), decreased problem solving, selective attention, and  short term memory. Pt's scores on the associated subtests of the Cognistat fell within the moderate to severe range. Functional problem solving deficits also noted during breakfast consumption. Pt required Max A for visual scanning and locating items on left side of body and tray.   Recommend pt receive skilled ST services while inpatient to address dysarthria, cognition, and dysphagia as described above in order to ensure diet safety and efficiency and maximize dunctional communication and functional independence at discharge. Anticipate pt will need 24/7 supervision and care and follow up ST at next venue of care.    Skilled Therapeutic Interventions          Bedside swallow and cognitive-linguistic evaluations were administered (See above for details) and results were shared with pt.    SLP Assessment  Patient will need skilled Speech Lanaguage Pathology Services during CIR admission    Recommendations  SLP Diet Recommendations: Dysphagia 1 (Puree);Honey Liquid Administration via: Cup Medication Administration: Crushed with puree Supervision: Staff to assist with self feeding;Full supervision/cueing for compensatory strategies Compensations: Minimize environmental distractions;Slow rate;Small sips/bites;Monitor for anterior loss;Lingual sweep for clearance of pocketing(may required suctioning or swab to help clear pocketing left side) Postural Changes and/or Swallow Maneuvers: Seated upright 90 degrees;Upright 30-60 min after meal Oral Care Recommendations: Oral care BID Patient destination: Home Follow up Recommendations: Home Health SLP;24 hour supervision/assistance Equipment Recommended: None recommended by SLP    SLP Frequency 3 to 5 out of 7 days   SLP Duration  SLP Intensity  SLP Treatment/Interventions 3-4 weeks  Minumum of 1-2 x/day, 30 to 90 minutes  Cueing hierarchy;Cognitive remediation/compensation;Environmental controls;Internal/external aids;Speech/Language  facilitation;Functional tasks;Dysphagia/aspiration precaution training;Patient/family education;Therapeutic Activities    Pain Pain Assessment Pain Scale: 0-10 Pain Score: 0-No pain      SLP Evaluation Cognition Overall Cognitive Status: Impaired/Different from baseline Orientation Level: Oriented to place;Oriented to person;Oriented to situation;Disoriented to time Attention: Sustained Sustained Attention: Impaired Sustained Attention Impairment: Verbal basic;Functional basic Memory: Impaired Memory Impairment: Decreased recall of new information;Decreased short term memory Decreased Short Term Memory: Verbal basic;Functional basic Awareness: Impaired Awareness Impairment: Intellectual impairment Problem Solving: Impaired Problem Solving Impairment: Functional basic;Verbal basic Executive Function: (all impaired due to lower level deficits) Safety/Judgment: Impaired  Comprehension Auditory Comprehension Overall Auditory Comprehension: Appears within functional limits for tasks assessed Yes/No Questions: Within Functional Limits Commands: Within Functional Limits Conversation: Simple Visual Recognition/Discrimination Discrimination: Not tested Reading Comprehension Reading Status: Not tested Expression Expression Primary Mode of Expression: Verbal Verbal Expression Overall Verbal Expression: Appears within functional limits for tasks assessed Initiation: No impairment Repetition: No impairment Naming: No impairment Pragmatics: No impairment Non-Verbal Means of Communication: Not applicable Written Expression Written Expression: Not tested Oral Motor Oral Motor/Sensory Function Overall Oral Motor/Sensory Function: Severe impairment Facial Symmetry: Abnormal symmetry left Facial Strength: Reduced left Facial Sensation: Within Functional Limits Lingual ROM: Within Functional Limits Lingual Symmetry: Abnormal symmetry left Velum: Within Functional Limits Mandible:  Within Functional Limits Motor Speech Overall Motor Speech: Impaired Respiration: Within functional limits Phonation: Normal Resonance: Within functional limits Articulation: Impaired Level of Impairment: Word Intelligibility: Intelligibility reduced Word: 25-49% accurate Phrase: 25-49% accurate Sentence: 0-24% accurate Conversation: 0-24% accurate Motor Planning: Witnin functional limits Motor Speech Errors: Not applicable   Intelligibility: Intelligibility reduced Word: 25-49% accurate Phrase: 25-49% accurate Sentence: 0-24% accurate Conversation: 0-24% accurate  Bedside Swallowing Assessment General Previous Swallow Assessment: 11/04/19 MBS Diet Prior to this Study: Dysphagia 1 (puree);Honey-thick liquids Temperature  Spikes Noted: N/A Respiratory Status: Room air History of Recent Intubation: No Behavior/Cognition: Cooperative;Alert Oral Cavity - Dentition: Adequate natural dentition Self-Feeding Abilities: Needs assist Patient Positioning: Upright in bed Baseline Vocal Quality: Normal Volitional Cough: Strong Volitional Swallow: Able to elicit  Oral Care Assessment Does patient have any of the following "high(er) risk" factors?: None of the above Does patient have any of the following "at risk" factors?: Diet - patient on thickened liquids Patient is AT RISK: Order set for Adult Oral Care Protocol initiated -  "At Risk Patients" option selected (see row information) Ice Chips Ice chips: Not tested Thin Liquid Thin Liquid: Not tested Nectar Thick Nectar Thick Liquid: Not tested Honey Thick Honey Thick Liquid: Impaired Presentation: Cup Oral Phase Impairments: Reduced labial seal Oral Phase Functional Implications: Left anterior spillage;Prolonged oral transit Pharyngeal Phase Impairments: Throat Clearing - Immediate Puree Puree: Impaired Presentation: Spoon Oral Phase Impairments: Reduced labial seal;Reduced lingual movement/coordination Oral Phase Functional  Implications: Left anterior spillage;Left lateral sulci pocketing Solid Solid: Not tested BSE Assessment Risk for Aspiration Impact on safety and function: Moderate aspiration risk Other Related Risk Factors: Cognitive impairment  Short Term Goals: Week 1: SLP Short Term Goal 1 (Week 1): Pt will consume current diet minimal overt s/sx aspiration and efficient mastication and oral clearance of solids with Max A multimodal cues for use of swallow strategies. SLP Short Term Goal 2 (Week 1): Pt will sustain attention to tasks for 15 minute intervals with Mod A cues for redirection. SLP Short Term Goal 3 (Week 1): Pt will demonstrate ability to recall new and daily information with Max A multimodal cues for compensatory strategies. SLP Short Term Goal 4 (Week 1): Pt will demonstrate ability to problem solve during basic familiar tasks with Max A verbal/visual cues. SLP Short Term Goal 5 (Week 1): Pt will scan left visual field and locate items on left side of environment in 50% opportunities with Max A multimodal cues. SLP Short Term Goal 6 (Week 1): Pt will communicate at the word and phrase level with Max A cues for use of speech intelligibility strategies to achieve ~60% intelligibility.  Refer to Care Plan for Long Term Goals  Recommendations for other services: None   Discharge Criteria: Patient will be discharged from SLP if patient refuses treatment 3 consecutive times without medical reason, if treatment goals not met, if there is a change in medical status, if patient makes no progress towards goals or if patient is discharged from hospital.  The above assessment, treatment plan, treatment alternatives and goals were discussed and mutually agreed upon: No family available/patient unable  Arbutus Leas 11/06/2019, 12:30 PM

## 2019-11-06 NOTE — Progress Notes (Signed)
PHYSICAL MEDICINE & REHABILITATION PROGRESS NOTE   Subjective/Complaints:  No issues overnite , working with SLP   ROS- neg CP, SOB, N/V/D  Objective:   No results found. Recent Labs    11/06/19 0537  WBC 10.6*  HGB 11.4*  HCT 35.9*  PLT 451*   Recent Labs    11/06/19 0537  NA 138  K 4.8  CL 102  CO2 24  GLUCOSE 105*  BUN 36*  CREATININE 1.31*  CALCIUM 9.0    Intake/Output Summary (Last 24 hours) at 11/06/2019 0827 Last data filed at 11/06/2019 0500 Gross per 24 hour  Intake 50 ml  Output 1000 ml  Net -950 ml     Physical Exam: Vital Signs Blood pressure (!) 122/53, pulse 85, temperature 98.9 F (37.2 C), resp. rate (!) 21, height 5\' 6"  (1.676 m), weight 55 kg, SpO2 99 %.   General: No acute distress Mood and affect are appropriate Heart: Regular rate and rhythm no rubs murmurs or extra sounds Lungs: Clear to auscultation, breathing unlabored, no rales or wheezes Abdomen: Positive bowel sounds, soft nontender to palpation, nondistended Extremities: No clubbing, cyanosis, or edema Skin: No evidence of breakdown, no evidence of rash Neurologic: Cranial nerves II through XII intact, motor strength is 4/5 in RIght 0/5 left  deltoid, bicep, tricep, grip, 4/5 RIght , 3- Left hip flexor, knee extensors,4/5 RIght and 0/5 left  ankle dorsiflexor and plantar flexor Sensory exam normal sensation to light touch and proprioception in bilateral upper and lower extremities Cerebellar exam normal finger to nose to finger as well as heel to shin in bilateral upper and lower extremities Musculoskeletal: Full range of motion in all 4 extremities. No joint swelling    Assessment/Plan: 1. Functional deficits secondary to Right MCA infarct  which require 3+ hours per day of interdisciplinary therapy in a comprehensive inpatient rehab setting.  Physiatrist is providing close team supervision and 24 hour management of active medical problems listed  below.  Physiatrist and rehab team continue to assess barriers to discharge/monitor patient progress toward functional and medical goals  Care Tool:  Bathing              Bathing assist       Upper Body Dressing/Undressing Upper body dressing        Upper body assist      Lower Body Dressing/Undressing Lower body dressing            Lower body assist       Toileting Toileting    Toileting assist       Transfers Chair/bed transfer  Transfers assist           Locomotion Ambulation   Ambulation assist              Walk 10 feet activity   Assist           Walk 50 feet activity   Assist           Walk 150 feet activity   Assist           Walk 10 feet on uneven surface  activity   Assist           Wheelchair     Assist               Wheelchair 50 feet with 2 turns activity    Assist            Wheelchair 150 feet activity  Assist          Blood pressure (!) 122/53, pulse 85, temperature 98.9 F (37.2 C), resp. rate (!) 21, height 5\' 6"  (1.676 m), weight 55 kg, SpO2 99 %.   Medical Problem List and Plan: 1.  Left hemiparesis with sensory deficits, L-HH with right inattention, pusher tendencies to the left/back, severe dysarthria secondary to right MCA infarct.             -patient may shower             -ELOS/Goals: 18-23 days/min/mod A.             CIR evals PT, OT  2.  Antithrombotics: -DVT/anticoagulation:  Pharmaceutical: Lovenox             -antiplatelet therapy: ASA daily.  3. Pain Management: Tylenol prn.  4. Mood: LCSW to follow for evaluation and support.              -antipsychotic agents: N/A 5. Neuropsych: This patient is capable of making decisions on her own behalf. 6. Skin/Wound Care: Routine pressure relief measure.  7. Fluids/Electrolytes/Nutrition: Monitor I/O.  CMP ordered for tomorrow a.m.--may need IVF at nights for hydration.  8. HTN: Monitor BP  tid--continue hydralazine and BB             Monitor with increased mobility 9. Chronic diastolic CHF/Valvular disease: Monitor weights daily with I/O. Continue hydralazine, metoprolol, pravastatin and ASA. No ARB/ACE due to CKD.              Daily weights 10. Dyslipidemia: On pravastatin.  11. GERD: resume PPI.  12. Chronic constipation: Continue Senna.  Adjust bowel meds as necessary. 13. CKD III: Monitor renal status with serial checks.  CMP ordered. 14. GU: urinary retention  DC foley in am and start bladder training.  Will order UA/Ucx. 15.  Post stroke dysphagia- given  degree of dysarthria expect severe oral phase issues as well              Pure with honey thick liquids, advance diet as tolerated    LOS: 1 days A FACE TO FACE EVALUATION WAS PERFORMED  11/06/2019, 8:27 AM

## 2019-11-06 NOTE — Evaluation (Signed)
Physical Therapy Assessment and Plan  Patient Details  Name: Carlinda Ohlson MRN: [id number] Date of Birth: 1924-02-04  PT Diagnosis: Abnormal posture, Abnormality of gait, Cognitive deficits, Coordination disorder, Difficulty walking, Hemiplegia non-dominant, Impaired cognition and Muscle weakness Rehab Potential: Fair ELOS: 21-24 days   Today's Date: 11/06/2019 PT Individual Time: 1300-1400 PT Individual Time Calculation (min): 60 min    Problem List:  Patient Active Problem List   Diagnosis Date Noted  . Acute right MCA stroke ([place]) 11/05/2019  . Acute cerebral infarction ([place])   . Stage 3 chronic kidney disease   . Chronic constipation   . Chronic diastolic congestive heart failure ([place])   . Essential hypertension   . Dysphagia, post-stroke     Past Medical History:  Past Medical History:  Diagnosis Date  . Anemia   . Bundle branch block, left   . Chronic diastolic CHF (congestive heart failure) ([place])   . CKD (chronic kidney disease), stage III   . GERD (gastroesophageal reflux disease)   . Hyperlipidemia   . Hypertension   . Pruritus   . Valvular heart disease    Past Surgical History: History reviewed. No pertinent surgical history.  Assessment & Plan Clinical Impression: Babetta Paterson is a 86 year old female with history of HTN, anemia, heart murmur who was seen in ED on 10/29/2019 with left hemiparesis, right gaze deviation.  CT head showed acute/early subacute infarct in R-MCA territory involving posterosuperior temporal lobe.  History taken from chart review and daughter due to cognition/dysarthria.  She received tPA and CTA head/neck showed 14 ml core infarct with perfusion deficits in R-MCA territory and occlusion of proximal superior and inferior division of right M2 MCA branches. She was transferred to [facility] for emergent thrombectomy  due to availability of IR there. She underwent thrombectomy by Dr. Arizona Constable and follow up MRI brain showed R-MCA inferior  division infarct without hemorrhage. Cardiac echo with ejection fraction of 60 to 65% with moderate to severe MVR and moderate AVR.   She was started on ASA for secondary stroke prevention and NGT placed for nutritional  support as failed MBS. Repeat MBS done 03/09 revealing moderate oral residue and vallecular stasis with penetration during and after swallow--placed on purees with honey liquids. Paxil added for neuroactivation.  Hospital course further complicated by dysphagia. Patient with resultant left sided weakness with sensory deficits, L-HH with right inattention, pusher tendencies to the left/back, severe dysarthria.  Patient was transferred to [facility] for CIR.  Please see preadmission assessment from earlier today as well. Patient transferred to CIR on 11/05/2019 .   Patient currently requires total with mobility secondary to muscle weakness, abnormal tone, decreased coordination and decreased motor planning, decreased visual perceptual skills, field cut and hemianopsia, decreased midline orientation, decreased attention to left, left side neglect and decreased motor planning, decreased initiation, decreased attention, decreased awareness, decreased problem solving, decreased safety awareness, decreased memory and delayed processing and decreased sitting balance, decreased standing balance, decreased postural control, hemiplegia and decreased balance strategies.  Prior to hospitalization, patient was modified independent  with mobility and lived with Daughter in a House home.  Home access is 3 steps with rail on right at back and  2 steps in front without railStairs to enter.  Patient will benefit from skilled PT intervention to maximize safe functional mobility, minimize fall risk and decrease caregiver burden for planned discharge home with 24 hour assist.  Anticipate patient will benefit from follow up [facility] at discharge.  PT -  End of Session Activity Tolerance: Decreased this  session Endurance Deficit: Yes PT Assessment Rehab Potential (ACUTE/IP ONLY): Fair PT Barriers to Discharge: Decreased caregiver support;Home environment access/layout;Behavior PT Barriers to Discharge Comments: per notes, family can provide care at [facility] level, goals currently set for [place]; STE at her home; impulsive and with processing and attention deficits PT Patient demonstrates impairments in the following area(s): Balance;Behavior;Endurance;Motor;Perception;Safety;Sensory PT Transfers Functional Problem(s): Bed Mobility;Bed to Chair;Car;Furniture;Floor PT Locomotion Functional Problem(s): Ambulation;Wheelchair Mobility;Stairs PT Plan PT Intensity: Minimum of 1-2 x/day ,45 to 90 minutes PT Frequency: 5 out of 7 days PT Duration Estimated Length of Stay: 21-24 days PT Treatment/Interventions: Ambulation/gait training;DME/adaptive equipment instruction;Psychosocial support;UE/LE Strength taining/ROM;Balance/vestibular training;Functional electrical stimulation;Skin care/wound management;UE/LE Coordination activities;Cognitive remediation/compensation;Functional mobility training;Splinting/orthotics;Visual/perceptual remediation/compensation;Community reintegration;Stair training;Neuromuscular re-education;Wheelchair propulsion/positioning;Discharge planning;Pain management;Therapeutic Activities;Disease management/prevention;Patient/family education;Therapeutic Exercise PT Transfers Anticipated Outcome(s): Mod assist, LRAD PT Locomotion Anticipated Outcome(s): Mod assist household distances, primarily mobility possibly in [facility] PT Recommendation Follow Up Recommendations: Home health PT;24 hour supervision/assistance Patient destination: Home Equipment Recommended: 3 in 1 bedside comode;Tub/shower seat;To be determined;Wheelchair (measurements);Wheelchair cushion (measurements) Equipment Details: further equipment needs TBD  Skilled Therapeutic Intervention   Patient received in bed, very  pleasant and willing to work with therapy, but perseverating on which side of the bed she is getting up on. She requires heavy levels of physical assistance including totalAx1 for rolling to supine to sit; needed MinA and max cues just to maintain sitting static balance and to reduce impulsivity. Note definite L inattention bordering on full on L neglect as well as poor orientation to midline, also moderate extensor tone noted L LE. Unable to follow cues for accurate assessment of vision, sensation, RAM, or MMT at EOB. Attempted squat-pivot with totalAx1, unable and needed MaxAx2 for safe squat-pivot to WC today. From there attempted [facility] navigation in the hallway, able to move WC approximately 5-51f with R UE/LE however with definite neglect of L side and ended up almost driving straight into the wall. Spent the rest of the session working on visually attending to her left side, and needed Max cues for head turning and visual scanning today. May need upgrade to tilt in space due to extensor tone possibly putting her at risk of sliding out of current chair, will continue to evaluate and update if necessary moving forward. Returned to bed with Max-totalAx2, left in bed with all needs met and bed alarm active.   PT Evaluation Precautions/Restrictions Precautions Precautions: Fall;Other (comment)(Swallow precautions.) Precaution Comments: L neglect/inattention, likely HH, impulsive, L hemi, pusher Restrictions Weight Bearing Restrictions: No General Chart Reviewed: Yes Family/Caregiver Present: No Vital SignsTherapy Vitals Temp: 98.2 F (36.8 C) Temp Source: Oral Pulse Rate: 89 Resp: 17 BP: (!) 125/52 Patient Position (if appropriate): Lying Oxygen Therapy SpO2: 98 % O2 Device: Room Air Pain Pain Assessment Pain Scale: Faces Pain Score: 0-No pain Faces Pain Scale: Hurts little more Pain Type: Acute pain;Chronic pain Pain Location: Knee Pain Orientation: Left Pain Descriptors / Indicators:  Other (Comment)(Patient unable to verbalize.) Pain Onset: Unable to tell Pain Intervention(s): Repositioned;Emotional support;Elevated extremity;Rest Home Living/Prior Functioning Home Living Available Help at Discharge: Family;Available 24 hours/day Type of Home: House Home Access: Stairs to enter [employer] Steps: 3 steps with rail on right at back and  2 steps in front without rail Entrance Stairs-Rails: Right Home Layout: Two level;Full bath on main level;Able to live on main level with bedroom/bathroom Bathroom Shower/Tub: Tub/shower unit;Curtain(Usually sponge bathes. Could stand in shower.) Bathroom Toilet: Standard Bathroom Accessibility: Yes  Lives  With: Daughter Prior Function Level of Independence: Requires assistive device for independence;Independent with basic ADLs  Able to Take Stairs?: Yes Driving: No Vocation: Retired Art gallery manager: Impaired Inattention/Neglect: Does not attend to left visual field;Does not attend to left side of body Praxis Praxis: Impaired Praxis Impairment Details: Motor planning;Perseveration;Initiation  Cognition Overall Cognitive Status: Impaired/Different from baseline Arousal/Alertness: Awake/alert Orientation Level: Oriented to place;Oriented to person;Oriented to situation;Disoriented to time Attention: Sustained Sustained Attention: Impaired Sustained Attention Impairment: Verbal basic;Functional basic Selective Attention: Impaired Selective Attention Impairment: Verbal basic;Functional basic Memory: Impaired Memory Impairment: Decreased recall of new information;Decreased short term memory Decreased Short Term Memory: Verbal basic;Functional basic Awareness: Impaired Awareness Impairment: Intellectual impairment Problem Solving: Impaired Problem Solving Impairment: Functional basic;Verbal basic Executive Function: (all impaired due to lower level deficits) Safety/Judgment:  Impaired Sensation Sensation Light Touch: Not tested(unable to assess- unable to follow cues) Coordination Gross Motor Movements are Fluid and Coordinated: Not tested(unable to follow cues for task) Fine Motor Movements are Fluid and Coordinated: Not tested(unable to follow cues for task) Finger Nose Finger Test: L UE hemiplegia, unable to follow cues for test R UE Heel Shin Test: unable to follow cues for testing Motor  Motor Motor: Hemiplegia;Abnormal tone;Abnormal postural alignment and control Motor - Skilled Clinical Observations: definite tone L LE- possible quad extensor tone; hemiparetic L LE and hemiplegic L UE  Mobility Bed Mobility Bed Mobility: Rolling Right;Rolling Left;Supine to Sit;Sit to Supine Rolling Right: Total Assistance - Patient < 25% Rolling Left: Total Assistance - Patient < 25% Supine to Sit: Total Assistance - Patient < 25% Sit to Supine: 2 Helpers Transfers Transfers: Corporate treasurer Transfers: 2 Helpers Transfer (Assistive device): 2 person hand held assist Locomotion  Gait Ambulation: No Gait Gait: No Stairs / Additional Locomotion Stairs: No Wheelchair Mobility Wheelchair Mobility: Yes Wheelchair Assistance: Total Assistance - Patient <25% Wheelchair Propulsion: Right lower extremity;Right upper extremity Wheelchair Parts Management: Needs assistance Distance: 37f  Trunk/Postural Assessment  Cervical Assessment Cervical Assessment: Exceptions to WFL(forward head) Thoracic Assessment Thoracic Assessment: (kyphotic) Lumbar Assessment Lumbar Assessment: Within Functional Limits Postural Control Postural Control: Deficits on evaluation  Balance Balance Balance Assessed: Yes Static Sitting Balance Static Sitting - Level of Assistance: 4: Min assist Dynamic Sitting Balance Dynamic Sitting - Level of Assistance: 2: Max assist Static Standing Balance Static Standing - Level of Assistance: 1: +2 Total assist Dynamic  Standing Balance Dynamic Standing - Level of Assistance: 1: +2 Total assist Extremity Assessment  RUE Assessment RUE Assessment: Not tested LUE Assessment LUE Assessment: Not tested RLE Assessment Active Range of Motion (AROM) Comments: appears WNL functionally, did not follow cues consistently enough for formal assessment General Strength Comments: did not follow cues consistently enough for formal assessment- estimate 4/5 grossly LLE Assessment LLE Assessment: Exceptions to WE[facility] CenterActive Range of Motion (AROM) Comments: limited by extensor tone, difficulty with functional knee flexion due to tone General Strength Comments: did not follow cues consistently enough to assess- estimate 3+/5 to 4-/5    Refer to Care Plan for Long Term Goals  Recommendations for other services: None   Discharge Criteria: Patient will be discharged from PT if patient refuses treatment 3 consecutive times without medical reason, if treatment goals not met, if there is a change in medical status, if patient makes no progress towards goals or if patient is discharged from hospital.  The above assessment, treatment plan, treatment alternatives and goals were discussed and mutually agreed upon: No family available/patient unable  KJordan  Atha Starks, DPT, PN1   Supplemental Physical Therapist [place]    Pager [phone] Acute Rehab Office [phone]

## 2019-11-07 ENCOUNTER — Inpatient Hospital Stay (HOSPITAL_COMMUNITY): Payer: Medicare Other

## 2019-11-07 ENCOUNTER — Inpatient Hospital Stay (HOSPITAL_COMMUNITY): Payer: Medicare Other | Admitting: Physical Therapy

## 2019-11-07 ENCOUNTER — Inpatient Hospital Stay (HOSPITAL_COMMUNITY): Payer: Medicare Other | Admitting: Speech Pathology

## 2019-11-07 MED ORDER — GENERIC EXTERNAL MEDICATION
Status: DC
Start: ? — End: 2019-11-07

## 2019-11-07 MED ORDER — CEPHALEXIN 250 MG PO CAPS
250.0000 mg | ORAL_CAPSULE | Freq: Three times a day (TID) | ORAL | Status: DC
  Administered 2019-11-07 – 2019-11-08 (×4): 250 mg via ORAL
  Filled 2019-11-07 (×4): qty 1

## 2019-11-07 NOTE — Progress Notes (Signed)
Physical Therapy Session Note  Patient Details  Name: Sarah Delacruz MRN: [id number] Date of Birth: [birth year]  Today's Date: 11/07/2019 PT Individual Time: 7096-2836 PT Individual Time Calculation (min): 32 min   Short Term Goals: Week 1:  PT Short Term Goal 1 (Week 1): Will be able to perform bed mobility with ModA and HOB elevated PT Short Term Goal 2 (Week 1): Will be able to maintain dynamic sittting balance with min guard for 7 minutes PT Short Term Goal 3 (Week 1): Will be able to transfer with ModAx2  Skilled Therapeutic Interventions/Progress Updates:   Pt received supine in bed with her daughter, Sedalia Muta, present and Gavin Pound, SW, exiting upon therapist's arrival. Pt agreeable to therapy session requesting to use bathroom. Therapist facilitating B LEs into hooklying positioning and pt able to lift hips enough for therapist to pull pants down total assist. Pt noted to be incontinent of bowels. Rolling L with min assist and max cuing for sequencing and use of RUE support on bedrail - therapist completed total assist peri-care and placed pt on bedpan as she reports needing to urinate. Despite increased time on bedpan pt unable to void. Performed total assist peri-care to ensure cleanliness. Performed rolling L with min assist and R with total assist while therapist pulled pants over hips total assist. Pt left supine in bed with needs in reach and bed alarm on.  Therapy Documentation Precautions:  Precautions Precautions: Fall, Other (comment)(Swallow precautions.) Precaution Comments: L neglect/inattention, likely HH, impulsive, L hemi, pusher Restrictions Weight Bearing Restrictions: No  Pain: No indications of pain during session.   Therapy/Group: Individual Therapy  Ginny Forth, PT, DPT 11/07/2019, 4:05 PM

## 2019-11-07 NOTE — Progress Notes (Signed)
Occupational Therapy Session Note  Patient Details  Name: Sarah Delacruz MRN: [id number] Date of Birth: [birth year]  Today's Date: 11/07/2019 OT Individual Time: 0904-1000 OT Individual Time Calculation (min): 56 min    Short Term Goals: Week 1:  OT Short Term Goal 1 (Week 1): Patient will complete UB dressing with Mod A in supported sitting with no more than Mod multimodal cues. OT Short Term Goal 2 (Week 1): Patient will demonstrate dynamic sitting balance unsupported with Mod A in prep for UB bathing/dressing tasks. OT Short Term Goal 3 (Week 1): Patient will perform bed mobility with 1 person assist in prep for ADL tasks. OT Short Term Goal 4 (Week 1): Patient will complete sit > stand transfers with Mod A +2 helpers.  Skilled Therapeutic Interventions/Progress Updates:  Patient met lying supine in bed and  in agreement with OT treatment session. 0/10 pain reported at rest and with activity. Patient able to follow verbal/tactile commands for supine to EOB transfer with increased time for sequencing and L attention. Patient able to advance RLE toward EOB with assist required for LLE. With [facility] elevated and use of bedrail, patient able to assist therapist with elevating trunk to seated position. UB bath in unsupported sitting with +2 helpers. Patient able to attend to L side with multimodal cueing. LB bathing/dressing attempted seated/standing at EOB but patient became incontinent of bowel. Hygiene for toileting in supine with +2 helpers with patient able to roll R <> L with Max A +2. Once seated EOB, patient completed sit > stand with Max A, hand over hand for placement of LUE on [place], and vc's for midline orientation of head/trunk. Patient left lying supine in bed with call bell within reach, bed alarm activated and all needs met.   Therapy Documentation Precautions:  Precautions Precautions: Fall, Other (comment)(Swallow precautions.) Precaution Comments: L neglect/inattention, likely HH,  impulsive, L hemi, pusher Restrictions Weight Bearing Restrictions: No  Therapy/Group: Individual Therapy  [name] 11/07/2019, 7:39 AM

## 2019-11-07 NOTE — Progress Notes (Signed)
Speech Language Pathology Daily Session Note  Patient Details  Name: Zahlia Deshazer MRN: [id number] Date of Birth: 29-Apr-1924  Today's Date: 11/07/2019 SLP Individual Time: 0175-1025 SLP Individual Time Calculation (min): 30 min  Short Term Goals: Week 1: SLP Short Term Goal 1 (Week 1): Pt will consume current diet minimal overt s/sx aspiration and efficient mastication and oral clearance of solids with Max A multimodal cues for use of swallow strategies. SLP Short Term Goal 2 (Week 1): Pt will sustain attention to tasks for 15 minute intervals with Mod A cues for redirection. SLP Short Term Goal 3 (Week 1): Pt will demonstrate ability to recall new and daily information with Max A multimodal cues for compensatory strategies. SLP Short Term Goal 4 (Week 1): Pt will demonstrate ability to problem solve during basic familiar tasks with Max A verbal/visual cues. SLP Short Term Goal 5 (Week 1): Pt will scan left visual field and locate items on left side of environment in 50% opportunities with Max A multimodal cues. SLP Short Term Goal 6 (Week 1): Pt will communicate at the word and phrase level with Max A cues for use of speech intelligibility strategies to achieve ~60% intelligibility.  Skilled Therapeutic Interventions:  Skilled treatment session targeted cognition goals. Pt was asleep when SLP entered room and pt required Max multimodal cues and more than a reasonable amount of time to arouse. SLP further facilitated session by providing Max A cues to sustain attention to basic task in 3 minute intervals, Max A multimodal cues to scan to left x1 to locate basic object and Max A cues to locate information on basic calendar. Pt missed 15 minutes of skilled ST d/t fatigue and inability to stay awake.      Pain Pain Assessment Pain Scale: Faces Pain Score: 0-No pain Faces Pain Scale: No hurt  Therapy/Group: Individual Therapy  Jeris Roser 11/07/2019, 4:23 PM

## 2019-11-07 NOTE — Progress Notes (Signed)
[facility] PHYSICAL MEDICINE & REHABILITATION PROGRESS NOTE   Subjective/Complaints:  Appears brighter today, voice is stronger   ROS- neg CP, SOB, N/V/D  Objective:   No results found. Recent Labs    11/06/19 0537  WBC 10.6*  HGB 11.4*  HCT 35.9*  PLT 451*   Recent Labs    11/06/19 0537  NA 138  K 4.8  CL 102  CO2 24  GLUCOSE 105*  BUN 36*  CREATININE 1.31*  CALCIUM 9.0    Intake/Output Summary (Last 24 hours) at 11/07/2019 0838 Last data filed at 11/07/2019 0553 Gross per 24 hour  Intake 1242.07 ml  Output 902 ml  Net 340.07 ml     Physical Exam: Vital Signs Blood pressure (!) 128/50, pulse 78, temperature 98.2 F (36.8 C), temperature source Oral, resp. rate 17, height 5\' 6"  (1.676 m), weight 55 kg, SpO2 99 %.   General: No acute distress Mood and affect are appropriate Heart: Regular rate and rhythm no rubs murmurs or extra sounds Lungs: Clear to auscultation, breathing unlabored, no rales or wheezes Abdomen: Positive bowel sounds, soft nontender to palpation, nondistended Extremities: No clubbing, cyanosis, or edema Skin: No evidence of breakdown, no evidence of rash Neurologic: Cranial nerves II through XII intact, motor strength is 4/5 in RIght 0/5 left  deltoid, bicep, tricep, grip, 4/5 RIght , 3- Left hip flexor, knee extensors,4/5 RIght and 0/5 left  ankle dorsiflexor and plantar flexor Sensory exam normal sensation to light touch and proprioception in bilateral upper and lower extremities Cerebellar exam normal finger to nose to finger as well as heel to shin in bilateral upper and lower extremities Musculoskeletal: Full range of motion in all 4 extremities. No joint swelling    Assessment/Plan: 1. Functional deficits secondary to Right MCA infarct  which require 3+ hours per day of interdisciplinary therapy in a comprehensive inpatient rehab setting.  Physiatrist is providing close team supervision and 24 hour management of active medical  problems listed below.  Physiatrist and rehab team continue to assess barriers to discharge/monitor patient progress toward functional and medical goals  Care Tool:  Bathing              Bathing assist       Upper Body Dressing/Undressing Upper body dressing        Upper body assist      Lower Body Dressing/Undressing Lower body dressing            Lower body assist       Toileting Toileting    Toileting assist Assist for toileting: Dependent - Patient 0%     Transfers Chair/bed transfer  Transfers assist     Chair/bed transfer assist level: 2 Helpers     Locomotion Ambulation   Ambulation assist   Ambulation activity did not occur: Safety/medical concerns(poor balance/high fall risk)          Walk 10 feet activity   Assist  Walk 10 feet activity did not occur: Safety/medical concerns(poor balance/high fall risk)        Walk 50 feet activity   Assist Walk 50 feet with 2 turns activity did not occur: Safety/medical concerns(poor balance/high fall risk)         Walk 150 feet activity   Assist Walk 150 feet activity did not occur: Safety/medical concerns(poor balance/high fall risk)         Walk 10 feet on uneven surface  activity   Assist Walk 10 feet on uneven surfaces activity did  not occur: Safety/medical concerns(poor balance/high fall risk)         Wheelchair     Assist Will patient use wheelchair at discharge?: Yes Type of Wheelchair: Manual    Wheelchair assist level: Total Assistance - Patient < 25% Max wheelchair distance: 8ft    Wheelchair 50 feet with 2 turns activity    Assist        Assist Level: Total Assistance - Patient < 25%   Wheelchair 150 feet activity     Assist      Assist Level: Total Assistance - Patient < 25%   Blood pressure (!) 128/50, pulse 78, temperature 98.2 F (36.8 C), temperature source Oral, resp. rate 17, height 5\' 6"  (1.676 m), weight 55 kg, SpO2 99  %.   Medical Problem List and Plan: 1.  Left hemiparesis with sensory deficits, L-HH with right inattention, pusher tendencies to the left/back, severe dysarthria secondary to right MCA infarct.             -patient may shower             -ELOS/Goals: 18-23 days/min/mod A.             CIR evals PT, OT . SLP 2.  Antithrombotics: -DVT/anticoagulation:  Pharmaceutical: Lovenox             -antiplatelet therapy: ASA daily.  3. Pain Management: Tylenol prn.  4. Mood: LCSW to follow for evaluation and support.              -antipsychotic agents: N/A 5. Neuropsych: This patient is capable of making decisions on her own behalf. 6. Skin/Wound Care: Routine pressure relief measure.  7. Fluids/Electrolytes/Nutrition: Monitor I/O.  CMP ordered for tomorrow a.m.--may need IVF at nights for hydration.  8. HTN: Monitor BP tid--continue hydralazine and BB             Vitals:   11/06/19 1507 11/07/19 0443  BP: (!) 125/52 (!) 128/50  Pulse: 89 78  Resp: 17   Temp: 98.2 F (36.8 C) 98.2 F (36.8 C)  SpO2: 98% 99%   9. Chronic diastolic CHF/Valvular disease: Monitor weights daily with I/O. Continue hydralazine, metoprolol, pravastatin and ASA. No ARB/ACE due to CKD.              Daily weights 10. Dyslipidemia: On pravastatin.  11. GERD: resume PPI.  12. Chronic constipation: Continue Senna.  Adjust bowel meds as necessary. 13. CKD III: Monitor renal status with serial checks.  CMP ordered. 14. GU: urinary retention  DC foley in am and start bladder training.   UA +, few bacteria, nitrite neg, afeb but Ucx 100K GNR, start keflex (allergy to sulfa)  15.  Post stroke dysphagia- given  degree of dysarthria expect severe oral phase issues as well              Pure with honey thick liquids, advance diet as tolerated    LOS: 2 days A FACE TO FACE EVALUATION WAS PERFORMED  01/07/20 11/07/2019, 8:38 AM

## 2019-11-07 NOTE — Progress Notes (Signed)
Physical Therapy Session Note  Patient Details  Name: Sarah Delacruz MRN: [id number] Date of Birth: 22-Jan-1924  Today's Date: 11/07/2019 PT Individual Time: 6190-1222 PT Individual Time Calculation (min): 58 min   Short Term Goals: Week 1:  PT Short Term Goal 1 (Week 1): Will be able to perform bed mobility with ModA and HOB elevated PT Short Term Goal 2 (Week 1): Will be able to maintain dynamic sittting balance with min guard for 7 minutes PT Short Term Goal 3 (Week 1): Will be able to transfer with ModAx2  Skilled Therapeutic Interventions/Progress Updates:    Patient received in bed, Sarah Delacruz present and observed part of session today. Needed MaxAx1 for all bed mobility today, however continues to require maxAx2 assist for squat pivot transfers to chair. Continued working on attending to L side, continues to need Max cues for head turns and bringing eyes to appropriate point to attend to and interact with objects on the left. Tolerated standing in standing frame for approximately 5-7 minutes with max cues to interact and attend to stimulus on left side. Followed this with gait in the hallway with R hand on railing and MaxAx1/close WC follow by second person, needed max cues and max facilitation to take steps but able to perform ambulation 1f then 52fat railing today! Very fatigued after gait. Returned to bed with 2 person maxA squat pivot, then worked on static sitting balance at EOB with Mod visual and verbal cues to find and keep midline static sitting position. She stated "I am tired, I need to lay down" and we did so with maxAx1. Finished session working on tone reduction techniques in bed. She was left in bed with Sarah Delacruz present, all needs otherwise met and bed alarm active this afternoon.   Therapy Documentation Precautions:  Precautions Precautions: Fall, Other (comment)(Swallow precautions.) Precaution Comments: L neglect/inattention, likely HH, impulsive, L hemi,  pusher Restrictions Weight Bearing Restrictions: No Pain: Pain Assessment Pain Scale: Faces Pain Score: 0-No pain Faces Pain Scale: No hurt    Therapy/Group: Individual Therapy   KrWindell NorfolkDPT, PN1   Supplemental Physical Therapist [place]  Pager [phone] Office [phone]  11/07/2019, 3:50 PM

## 2019-11-07 NOTE — IPOC Note (Signed)
Overall Plan of Care [facility]) Patient Details Name: Sarah Delacruz MRN: [id number] DOB: 04-27-1924  Admitting Diagnosis: R MCA infarct  Hospital Problems: Active Problems:   Acute cerebral infarction [facility])   Stage 3 chronic kidney disease   Chronic constipation   Chronic diastolic congestive heart failure (HCC)   Essential hypertension   Acute right MCA stroke (HCC)   Dysphagia, post-stroke     Functional Problem List: Nursing Bladder, Bowel, Endurance, Medication Management, Motor, Skin Integrity, Safety, Nutrition  PT Balance, Behavior, Endurance, Motor, Perception, Safety, Sensory  OT Balance, Cognition, Endurance, Motor, Perception, Safety, Vision, Pain  SLP Cognition, Linguistic, Nutrition  TR         Basic ADL's: OT Eating, Grooming, Bathing, Dressing, Toileting     Advanced  ADL's: OT       Transfers: PT Bed Mobility, Bed to Chair, Car, Furniture, Civil Service fast streamer, Research scientist (life sciences): PT Ambulation, Psychologist, prison and probation services, Stairs     Additional Impairments: OT Fuctional Use of Upper Extremity  SLP Swallowing, Communication, Social Cognition expression Problem Solving, Memory, Awareness, Attention  TR      Anticipated Outcomes Item Anticipated Outcome  Self Feeding Set-up  Swallowing  Min A   Basic self-care  Mod A  Toileting  Mod A   Bathroom Transfers Mod A  Bowel/Bladder  Min/mod assist  Transfers  Mod assist, LRAD  Locomotion  Mod assist household distances, primarily mobility possibly in [employer]  Communication  Min A  Cognition  Min A  Pain  < 3  Safety/Judgment  Supervision   Therapy Plan: PT Intensity: Minimum of 1-2 x/day ,45 to 90 minutes PT Frequency: 5 out of 7 days PT Duration Estimated Length of Stay: 21-24 days OT Intensity: Minimum of 1-2 x/day, 45 to 90 minutes OT Frequency: 5 out of 7 days OT Duration/Estimated Length of Stay: 19-21 days SLP Intensity: Minumum of 1-2 x/day, 30 to 90 minutes SLP Frequency: 3 to 5 out of  7 days SLP Duration/Estimated Length of Stay: 3-4 weeks   Due to the current state of emergency, patients may not be receiving their 3-hours of Medicare-mandated therapy.   Team Interventions: Nursing Interventions Patient/Family Education, Bladder Management, Bowel Management, Medication Management, Disease Management/Prevention, Skin Care/Wound Management, Discharge Planning, Dysphagia/Aspiration Precaution Training, Psychosocial Support  PT interventions Ambulation/gait training, DME/adaptive equipment instruction, Psychosocial support, UE/LE Strength taining/ROM, Balance/vestibular training, Functional electrical stimulation, Skin care/wound management, UE/LE Coordination activities, Cognitive remediation/compensation, Functional mobility training, Splinting/orthotics, Visual/perceptual remediation/compensation, Community reintegration, Museum/gallery curator, Chief of Staff, Wheelchair propulsion/positioning, Discharge planning, Pain management, Therapeutic Activities, Disease management/prevention, Equities trader education, Therapeutic Exercise  OT Interventions Warden/ranger, Cognitive remediation/compensation, Community reintegration, Discharge planning, DME/adaptive equipment instruction, Functional electrical stimulation, Functional mobility training, Neuromuscular re-education, Pain management, Patient/family education, Self Care/advanced ADL retraining, Splinting/orthotics, Therapeutic Activities, Therapeutic Exercise, UE/LE Strength taining/ROM, UE/LE Coordination activities, Visual/perceptual remediation/compensation, Wheelchair propulsion/positioning  SLP Interventions Cueing hierarchy, Cognitive remediation/compensation, Environmental controls, Internal/external aids, Speech/Language facilitation, Functional tasks, Dysphagia/aspiration precaution training, Patient/family education, Therapeutic Activities  TR Interventions    SW/CM Interventions Discharge Planning, Disease  Management/Prevention, Psychosocial Support, Patient/Family Education   Barriers to Discharge MD  Medical stability  Nursing      PT Decreased caregiver support, Home environment access/layout, Behavior per notes, family can provide care at [facility] level, goals currently set for ModA; STE at her home; impulsive and with processing and attention deficits  OT      SLP      SW       Team Discharge  Planning: Destination: PT-Home ,OT- Home , SLP-Home Projected Follow-up: PT-Home health PT, 24 hour supervision/assistance, OT-  Home health OT, SLP-Home Health SLP, 24 hour supervision/assistance Projected Equipment Needs: PT-3 in 1 bedside comode, Tub/shower seat, To be determined, Wheelchair (measurements), Wheelchair cushion (measurements), OT-  , SLP-None recommended by SLP Equipment Details: PT-further equipment needs TBD, OT-TBD Patient/family involved in discharge planning: PT- Patient unable/family or caregiver not available,  OT-Patient, SLP-Patient unable/family or caregive not available  MD ELOS: 18-21d Medical Rehab Prognosis:  Fair Assessment:  86 year old female with history of HTN, anemia, heart murmur who was seen in ED on 10/29/2019 with left hemiparesis, right gaze deviation.  CT head showed acute/early subacute infarct in R-MCA territory involving posterosuperior temporal lobe.  History taken from chart review and daughter due to cognition/dysarthria.  She received tPA and CTA head/neck showed 14 ml core infarct with perfusion deficits in R-MCA territory and occlusion of proximal superior and inferior division of right M2 MCA branches. She was transferred to [facility] for emergent thrombectomy  due to availability of IR there. She underwent thrombectomy by Dr. Arizona Constable and follow up MRI brain showed R-MCA inferior division infarct without hemorrhage. Cardiac echo with ejection fraction of 60 to 65% with moderate to severe MVR and moderate AVR.   She was started on ASA for secondary  stroke prevention and NGT placed for nutritional  support as failed MBS. Repeat MBS done 03/09 revealing moderate oral residue and vallecular stasis with penetration during and after swallow--placed on purees with honey liquids. Paxil added for neuroactivation.  Hospital course further complicated by dysphagia. Patient with resultant left sided weakness with sensory deficits, L-HH with right inattention, pusher tendencies to the left/back, severe dysarthria   See Team Conference Notes for weekly updates to the plan of care  Now requiring 24/7 Rehab RN,MD, as well as CIR level PT, OT and SLP.  Treatment team will focus on ADLs and mobility with goals set at [facility] A

## 2019-11-08 ENCOUNTER — Inpatient Hospital Stay (HOSPITAL_COMMUNITY): Payer: Medicare Other

## 2019-11-08 ENCOUNTER — Inpatient Hospital Stay (HOSPITAL_COMMUNITY): Payer: Medicare Other | Admitting: Physical Therapy

## 2019-11-08 ENCOUNTER — Inpatient Hospital Stay (HOSPITAL_COMMUNITY): Payer: Medicare Other | Admitting: Speech Pathology

## 2019-11-08 DIAGNOSIS — N1831 Chronic kidney disease, stage 3a: Secondary | ICD-10-CM

## 2019-11-08 LAB — URINE CULTURE: Culture: >=100000 — AB

## 2019-11-08 MED ORDER — SODIUM CHLORIDE 0.9 % IV SOLN
3.0000 g | Freq: Two times a day (BID) | INTRAVENOUS | Status: DC
  Administered 2019-11-08 – 2019-11-12 (×8): 3 g via INTRAVENOUS
  Filled 2019-11-08: qty 3
  Filled 2019-11-08: qty 8
  Filled 2019-11-08 (×2): qty 3
  Filled 2019-11-08 (×2): qty 8
  Filled 2019-11-08: qty 3
  Filled 2019-11-08: qty 8
  Filled 2019-11-08: qty 3
  Filled 2019-11-08: qty 8

## 2019-11-08 MED ORDER — GENERIC EXTERNAL MEDICATION
Status: DC
Start: ? — End: 2019-11-08

## 2019-11-08 NOTE — Progress Notes (Signed)
Occupational Therapy Session Note  Patient Details  Name: Sarah Delacruz MRN: [id number] Date of Birth: 1923-11-02  Today's Date: 11/08/2019 OT Individual Time: 1100-1157 OT Individual Time Calculation (min): 57 min    Short Term Goals: Week 1:  OT Short Term Goal 1 (Week 1): Patient will complete UB dressing with Mod A in supported sitting with no more than Mod multimodal cues. OT Short Term Goal 2 (Week 1): Patient will demonstrate dynamic sitting balance unsupported with Mod A in prep for UB bathing/dressing tasks. OT Short Term Goal 3 (Week 1): Patient will perform bed mobility with 1 person assist in prep for ADL tasks. OT Short Term Goal 4 (Week 1): Patient will complete sit > stand transfers with Mod A +2 helpers.  Skilled Therapeutic Interventions/Progress Updates:    1:1. Pt received in bed agreeable to OT with no pain reported. Pt completes supine>sitting EOB with MAX A and A to bring LLE to EOB. Pt with retropulsion seated EOB when attempting to reciprocally scoot to EOB requiring max faciliation to upright. Pt requries MAX +2 to transfer to weak L side and MAX A to R with bed rail at end of session. Pt requires ultimately total A to don/doff nightgown>pullover shirt/jacket with VC for L attention and hemi dressing techniques. Pt completes WB activity in standing frame reaching L for WB through elbow on table with mod VC for L attention and throws bean bag to target on table. Pt able to tolerate 7-10 min in standing frame. Pt completes 2x27min dynavision up to 17 seconds and mod VC for locating stimuli on L and 5 seconds on R demoing poor initiation and L attention. Exited sesion with pt seated in bed, exit alarm onand call light tin reach  Therapy Documentation Precautions:  Precautions Precautions: Fall, Other (comment)(Swallow precautions.) Precaution Comments: L neglect/inattention, likely HH, impulsive, L hemi, pusher Restrictions Weight Bearing Restrictions: No General:    Vital Signs:  Pain: Pain Assessment Pain Scale: 0-10 Pain Score: 0-No pain ADL: ADL Grooming: Dependent(Honey thick liquids) Upper Body Bathing: Maximal cueing, Maximal assistance(Multimodal cueing for focused attention and sustained attention.) Where Assessed-Upper Body Bathing: Edge of bed Lower Body Bathing: Maximal cueing, Dependent(+2 helpers) Where Assessed-Lower Body Bathing: Edge of bed Upper Body Dressing: Maximal cueing, Maximal assistance Where Assessed-Upper Body Dressing: Edge of bed Lower Body Dressing: Maximal cueing, Dependent(+2 helpers) Where Assessed-Lower Body Dressing: Edge of bed Toileting: Dependent(Incontinent brief) Toilet Transfer: Unable to assess Toilet Transfer Method: Unable to assess Tub/Shower Transfer: Unable to assess ADL Comments: Patient with poor static/dynamic sitting balance, L inattention/pusher syndrome, and need for maximal cueing for focused and sustained attention. Vision   Perception    Praxis   Exercises:   Other Treatments:     Therapy/Group: Individual Therapy  Shon Hale 11/08/2019, 12:07 PM

## 2019-11-08 NOTE — Progress Notes (Signed)
[place] PHYSICAL MEDICINE & REHABILITATION PROGRESS NOTE   Subjective/Complaints:  Pt reports she needs me to put socks on her feet- they are cold- also dysarthric, however reported she's worried might "poop" in bed if she eats-  Also wanted shoes on, but since staying in bed- only helped put on covers and socks.    ROS-  Pt denies SOB, abd pain, CP, N/V/C/D, and vision changes   Objective:   No results found. Recent Labs    11/06/19 0537  WBC 10.6*  HGB 11.4*  HCT 35.9*  PLT 451*   Recent Labs    11/06/19 0537  NA 138  K 4.8  CL 102  CO2 24  GLUCOSE 105*  BUN 36*  CREATININE 1.31*  CALCIUM 9.0    Intake/Output Summary (Last 24 hours) at 11/08/2019 1546 Last data filed at 11/08/2019 1300 Gross per 24 hour  Intake 460 ml  Output 1375 ml  Net -915 ml     Physical Exam: Vital Signs Blood pressure (!) 152/63, pulse 74, temperature 98.2 F (36.8 C), resp. rate 16, height 5\' 6"  (1.676 m), weight 55 kg, SpO2 98 %.   General: No acute distress; sitting up in bed; shivering a little, nursing at bedside, NAD- assisted her to get yellow socks in place Psychiatric- appropriate, but hard to understand due to dysarthria Heart: RRR Lungs: CTA B/L- good air movement Abdomen: Psoft, NT, ND, (+)BS hypoactive Extremities: No clubbing, cyanosis, or edema Skin: No evidence of breakdown, no evidence of rash Neurologic: severe L facial droop- makes her dysarthric , motor strength is 4/5 in RIght 0/5 left  deltoid, bicep, tricep, grip, 4/5 RIght , 3- Left hip flexor, knee extensors,4/5 RIght and 0/5 left  ankle dorsiflexor and plantar flexor Sensory exam normal sensation to light touch and proprioception in bilateral upper and lower extremities Cerebellar exam normal finger to nose to finger as well as heel to shin in bilateral upper and lower extremities Musculoskeletal: Full range of motion in all 4 extremities. No joint swelling    Assessment/Plan: 1. Functional  deficits secondary to Right MCA infarct  which require 3+ hours per day of interdisciplinary therapy in a comprehensive inpatient rehab setting.  Physiatrist is providing close team supervision and 24 hour management of active medical problems listed below.  Physiatrist and rehab team continue to assess barriers to discharge/monitor patient progress toward functional and medical goals  Care Tool:  Bathing    Body parts bathed by patient: Left arm, Chest, Abdomen, Right upper leg, Left upper leg, Face   Body parts bathed by helper: Right arm, Front perineal area, Buttocks, Right lower leg, Left lower leg     Bathing assist Assist Level: 2 Helpers     Upper Body Dressing/Undressing Upper body dressing   What is the patient wearing?: (Zip up jacket)    Upper body assist Assist Level: Total Assistance - Patient < 25%    Lower Body Dressing/Undressing Lower body dressing      What is the patient wearing?: Incontinence brief     Lower body assist Assist for lower body dressing: 2 Helpers     Toileting Toileting    Toileting assist Assist for toileting: Dependent - Patient 0%     Transfers Chair/bed transfer  Transfers assist     Chair/bed transfer assist level: 2 Helpers     Locomotion Ambulation   Ambulation assist   Ambulation activity did not occur: Safety/medical concerns(poor balance/high fall risk)  Assist level: 2 helpers Assistive  device: Other (comment)(railing in the hallway) Max distance: 63ft, 24ft   Walk 10 feet activity   Assist  Walk 10 feet activity did not occur: Safety/medical concerns(poor balance/high fall risk)        Walk 50 feet activity   Assist Walk 50 feet with 2 turns activity did not occur: Safety/medical concerns(poor balance/high fall risk)         Walk 150 feet activity   Assist Walk 150 feet activity did not occur: Safety/medical concerns(poor balance/high fall risk)         Walk 10 feet on uneven surface   activity   Assist Walk 10 feet on uneven surfaces activity did not occur: Safety/medical concerns(poor balance/high fall risk)         Wheelchair     Assist Will patient use wheelchair at discharge?: Yes Type of Wheelchair: Manual    Wheelchair assist level: Total Assistance - Patient < 25% Max wheelchair distance: 74ft    Wheelchair 50 feet with 2 turns activity    Assist        Assist Level: Total Assistance - Patient < 25%   Wheelchair 150 feet activity     Assist      Assist Level: Total Assistance - Patient < 25%   Blood pressure (!) 152/63, pulse 74, temperature 98.2 F (36.8 C), resp. rate 16, height 5\' 6"  (1.676 m), weight 55 kg, SpO2 98 %.   Medical Problem List and Plan: 1.  Left hemiparesis with sensory deficits, L-HH with right inattention, pusher tendencies to the left/back, severe dysarthria secondary to right MCA infarct.             -patient may shower             -ELOS/Goals: 18-23 days/min/mod A.             CIR evals PT, OT . SLP 2.  Antithrombotics: -DVT/anticoagulation:  Pharmaceutical: Lovenox             -antiplatelet therapy: ASA daily.  3. Pain Management: Tylenol prn.  4. Mood: LCSW to follow for evaluation and support.              -antipsychotic agents: N/A 5. Neuropsych: This patient is? capable of making decisions on her own behalf. 6. Skin/Wound Care: Routine pressure relief measure.  7. Fluids/Electrolytes/Nutrition: Monitor I/O.  CMP ordered for tomorrow a.m.--may need IVF at nights for hydration.  8. HTN: Monitor BP tid--continue hydralazine and BB  3/13- BP 150s/50s-60s- is getting better controlled- con't BP meds             Vitals:   11/08/19 0432 11/08/19 0747  BP: (!) 152/52 (!) 152/63  Pulse: 78 74  Resp: 18 16  Temp: 98.2 F (36.8 C)   SpO2: 99% 98%   9. Chronic diastolic CHF/Valvular disease: Monitor weights daily with I/O. Continue hydralazine, metoprolol, pravastatin and ASA. No ARB/ACE due to CKD.               Daily weights Filed Weights   11/05/19 2111  Weight: 55 kg    3/13- no weight since 3/10- will verify it's daily weights 10. Dyslipidemia: On pravastatin.  11. GERD: resume PPI.  12. Chronic constipation: Continue Senna.  Adjust bowel meds as necessary. 13. CKD III: Monitor renal status with serial checks.  CMP ordered. 14. GU: urinary retention  DC foley in am and start bladder training.   UA +, few bacteria, nitrite neg, afeb but Ucx 100K GNR,  start keflex (allergy to sulfa)   3/13- Pharmacy called- resistant to Keflex- changed ot Unasyn since is sensitive- for an additional 5 days since hasn't been treated so far.  15.  Post stroke dysphagia- given  degree of dysarthria expect severe oral phase issues as well              Pure with honey thick liquids, advance diet as tolerated   3/13- at risk for dehydration even though on IVFs 12 hours/day- will recheck BMP. Esp since on IV ABX.   LOS: 3 days A FACE TO FACE EVALUATION WAS PERFORMED  Chaise Passarella 11/08/2019, 3:46 PM

## 2019-11-08 NOTE — Plan of Care (Signed)
  Problem: Consults Goal: RH STROKE PATIENT EDUCATION Description: See Patient Education module for education specifics  Outcome: Progressing   Problem: RH BOWEL ELIMINATION Goal: RH STG MANAGE BOWEL WITH ASSISTANCE Description: STG Manage Bowel with min Assistance. Outcome: Progressing Goal: RH STG MANAGE BOWEL W/MEDICATION W/ASSISTANCE Description: STG Manage Bowel with Medication with min Assistance. Outcome: Progressing   Problem: RH BLADDER ELIMINATION Goal: RH STG MANAGE BLADDER WITH ASSISTANCE Description: STG Manage Bladder With min Assistance Outcome: Progressing   Problem: RH SKIN INTEGRITY Goal: RH STG SKIN FREE OF INFECTION/BREAKDOWN Description: Patients skin will remain free from further infection or breakdown with min assist. Outcome: Progressing Goal: RH STG MAINTAIN SKIN INTEGRITY WITH ASSISTANCE Description: STG Maintain Skin Integrity With min Assistance. Outcome: Progressing Goal: RH STG ABLE TO PERFORM INCISION/WOUND CARE W/ASSISTANCE Description: STG Able To Perform Incision/Wound Care With min Assistance. Outcome: Progressing   Problem: RH SAFETY Goal: RH STG ADHERE TO SAFETY PRECAUTIONS W/ASSISTANCE/DEVICE Description: STG Adhere to Safety Precautions With supervision Assistance/Device. Outcome: Progressing   Problem: RH KNOWLEDGE DEFICIT Goal: RH STG INCREASE KNOWLEDGE OF HYPERTENSION Description: Patient/caregiver will verbalize understanding of management of HTN including diet, exercise, medications, monitoring, and follow up care with min assist. Outcome: Progressing Goal: RH STG INCREASE KNOWLEDGE OF DYSPHAGIA/FLUID INTAKE Description: Patient/caregiver will verbalize understanding of management of dysphagia as guided by SLP. Outcome: Progressing Goal: RH STG INCREASE KNOWLEGDE OF HYPERLIPIDEMIA Description: Patient/caregiver will verbalize understanding of management of HLD including diet, exercise, medications, monitoring, and follow up care  with min assist. Outcome: Progressing   

## 2019-11-08 NOTE — Progress Notes (Signed)
Speech Language Pathology Daily Session Note  Patient Details  Name: Sarah Delacruz MRN: [id number] Date of Birth: Feb 14, 1924  Today's Date: 11/08/2019 SLP Individual Time: 5035-4656 SLP Individual Time Calculation (min): 56 min  Short Term Goals: Week 1: SLP Short Term Goal 1 (Week 1): Pt will consume current diet minimal overt s/sx aspiration and efficient mastication and oral clearance of solids with Max A multimodal cues for use of swallow strategies. SLP Short Term Goal 2 (Week 1): Pt will sustain attention to tasks for 15 minute intervals with Mod A cues for redirection. SLP Short Term Goal 3 (Week 1): Pt will demonstrate ability to recall new and daily information with Max A multimodal cues for compensatory strategies. SLP Short Term Goal 4 (Week 1): Pt will demonstrate ability to problem solve during basic familiar tasks with Max A verbal/visual cues. SLP Short Term Goal 5 (Week 1): Pt will scan left visual field and locate items on left side of environment in 50% opportunities with Max A multimodal cues. SLP Short Term Goal 6 (Week 1): Pt will communicate at the word and phrase level with Max A cues for use of speech intelligibility strategies to achieve ~60% intelligibility.  Skilled Therapeutic Interventions: Patient received skilled SLP services targeting speech and swallowing goals. Patient completed pharyngeal strengthening exercises including: effortful swallow x30, base of tongue retraction x30, and lingual resistance x30 following max verbal and tactile cues. Patient did require frequent rest breaks between exercises due to fatigue. SLP facilitated functional communication at the word level with patient identifying names of her family members and how she is related to each family member with 50% intelligibility requiring max verbal cues to over-articulate. At the phrase level when describing each family member patient was 25% intelligible requiring max verbal cues to over-articulate  and slow rate of speech to increase listener understanding. At the end of therapy session patient was upright in bed, bed alarm activated, and all needs within reach.  Pain Pain Assessment Pain Scale: 0-10 Pain Score: 0-No pain  Therapy/Group: Individual Therapy  Arnette Schaumann 11/08/2019, 10:02 AM

## 2019-11-08 NOTE — Progress Notes (Signed)
Physical Therapy Session Note  Patient Details  Name: Sarah Delacruz MRN: [id number] Date of Birth: 1923/10/12  Today's Date: 11/08/2019 PT Individual Time: 4081-4481 PT Individual Time Calculation (min): 72 min   Short Term Goals: Week 1:  PT Short Term Goal 1 (Week 1): Will be able to perform bed mobility with ModA and HOB elevated PT Short Term Goal 2 (Week 1): Will be able to maintain dynamic sittting balance with min guard for 7 minutes PT Short Term Goal 3 (Week 1): Will be able to transfer with ModAx2  Skilled Therapeutic Interventions/Progress Updates:   Pt received supine in bed with NT present reporting pt needs to use bathroom - pt agreeable to therapy session and therapist assumed care of patient. Therapist facilitated placing B LEs into hooklying position and pt able to clear hips minimally for therapist to pull pants down total assist. Rolled L with min assist and cuing for sequencing while therapist doffed brief total assist and placed bedpan - pt noted to have small BM smear in brief. Pt continent of small BM and required total assist for peri-care and LB clothing management rolling L with min assist using bedrails and rolling R with max/total assist due to poor motor planning. Supine>sitting R EOB via logroll technique to increase pt independence. L squat pivot transfer with +2 max assist due to pt leaning posterior with minimal anterior trunk flexion.  Transported to/from gym in w/c. Sit>stand at R hallway rail with heavy max assist for lifting into standing due strong left posterolateral lean progressing to mod assist with max multimodal cuing for improved upright trunk posture (significant forward trunk flexion) and increased anterior weight shift. Gait training at R hallway rail 71ft then 91ft (seated break between) with R UE support on rail and L UE over therapist's shoulders with mod assist for balance and L LE management - pt able to initiate L LE swing phase requiring  intermittent assist for increased step length - demonstrates decreased bilateral knee flexion during swing, significant forward trunk flexion requiring cuing for improvement. While walking pt stated "Kara Mead is pooping." Transported back to room in w/c. Therapist retrieved L hand RW orthotic. Sit>stand w/c>RW with heavy max assist for lifting into standing due to strong left posterolateral lean - transitioned to R hand on bedrail for increased support - maintained standing balance with mod assist while +2 assist placed BSC behind pt and performed total assist LB clothing management and pericare - pt continent of bowels and bladder on BSC. Sit>stand BSC>RW/bedrail with heavy max assist for lifting into standing due to continued significant left posterior lean - stand pivot to sit EOB with max assist. Sit<>stand EOB<>RW with continued heavy max assist and +2 for safety/assist as needed while in standing provided multimodal cuing for increased upright trunk posture and R lateral weight shift. Sit>supine with mod assist for trunk descent and B LE management into bed with multimodal cuing for sequencing reverse logroll to increase pt independence. Pt left supine in bed with needs in reach and bed alarm on.   Therapy Documentation Precautions:  Precautions Precautions: Fall, Other (comment)(Swallow precautions.) Precaution Comments: L neglect/inattention, likely HH, impulsive, L hemi, pusher Restrictions Weight Bearing Restrictions: No  Pain:   Denies pain during session.   Therapy/Group: Individual Therapy  Ginny Forth, PT, DPT 11/08/2019, 2:11 PM

## 2019-11-09 ENCOUNTER — Inpatient Hospital Stay (HOSPITAL_COMMUNITY): Payer: Medicare Other | Admitting: Speech Pathology

## 2019-11-09 LAB — BASIC METABOLIC PANEL
Anion gap: 12 (ref 5–15)
BUN: 31 mg/dL — ABNORMAL HIGH (ref 8–23)
CO2: 22 mmol/L (ref 22–32)
Calcium: 8.7 mg/dL — ABNORMAL LOW (ref 8.9–10.3)
Chloride: 104 mmol/L (ref 98–111)
Creatinine, Ser: 1.15 mg/dL — ABNORMAL HIGH (ref 0.44–1.00)
GFR calc Af Amer: 47 mL/min — ABNORMAL LOW (ref >60)
GFR calc non Af Amer: 41 mL/min — ABNORMAL LOW (ref >60)
Glucose, Bld: 104 mg/dL — ABNORMAL HIGH (ref 70–99)
Potassium: 4.9 mmol/L (ref 3.5–5.1)
Sodium: 138 mmol/L (ref 135–145)

## 2019-11-09 LAB — CBC WITH DIFFERENTIAL/PLATELET
Abs Immature Granulocytes: 0.2 10*3/uL — ABNORMAL HIGH (ref 0.00–0.07)
Basophils Absolute: 0.1 10*3/uL (ref 0.0–0.1)
Basophils Relative: 1 %
Eosinophils Absolute: 0.3 10*3/uL (ref 0.0–0.5)
Eosinophils Relative: 3 %
HCT: 37 % (ref 36.0–46.0)
Hemoglobin: 11.5 g/dL — ABNORMAL LOW (ref 12.0–15.0)
Immature Granulocytes: 2 %
Lymphocytes Relative: 35 %
Lymphs Abs: 3.3 10*3/uL (ref 0.7–4.0)
MCH: 29.2 pg (ref 26.0–34.0)
MCHC: 31.1 g/dL (ref 30.0–36.0)
MCV: 93.9 fL (ref 80.0–100.0)
Monocytes Absolute: 0.9 10*3/uL (ref 0.1–1.0)
Monocytes Relative: 9 %
Neutro Abs: 4.8 10*3/uL (ref 1.7–7.7)
Neutrophils Relative %: 50 %
Platelets: 391 10*3/uL (ref 150–400)
RBC: 3.94 MIL/uL (ref 3.87–5.11)
RDW: 13.7 % (ref 11.5–15.5)
WBC: 9.5 10*3/uL (ref 4.0–10.5)
nRBC: 0 % (ref 0.0–0.2)

## 2019-11-09 MED ORDER — GENERIC EXTERNAL MEDICATION
Status: DC
Start: ? — End: 2019-11-09

## 2019-11-09 NOTE — Progress Notes (Signed)
[place] PHYSICAL MEDICINE & REHABILITATION PROGRESS NOTE   Subjective/Complaints:  Pt reports slept OK- denies any pain- feeling stronger/more with it- started Unasyn yesterday- denies any side effects.   Less cold than yesterday but covered up with 2-3 blankets.     ROS-   Pt denies SOB, abd pain, CP, N/V/C/D, and vision changes  Objective:   No results found. Recent Labs    11/09/19 0600  WBC 9.5  HGB 11.5*  HCT 37.0  PLT 391   Recent Labs    11/09/19 0600  NA 138  K 4.9  CL 104  CO2 22  GLUCOSE 104*  BUN 31*  CREATININE 1.15*  CALCIUM 8.7*    Intake/Output Summary (Last 24 hours) at 11/09/2019 1302 Last data filed at 11/09/2019 1245 Gross per 24 hour  Intake 1182.38 ml  Output 1200 ml  Net -17.62 ml     Physical Exam: Vital Signs Blood pressure (!) 181/59, pulse 70, temperature 98 F (36.7 C), temperature source Oral, resp. rate 18, height 5\' 6"  (1.676 m), weight 52.3 kg, SpO2 97 %.   General: No acute distress;sitting up in bed; more appropriate, dysarthric- hard to understand, NAD Psychiatric- more with it/awake- dysarthria noted still Heart: RRR< no MR/G, no JVD Lungs: CTA B/L- no W/R/R- good air movement Abdomen: Soft, NT, ND, (+)BS  Extremities: No clubbing, cyanosis, or edema Skin: No evidence of breakdown, no evidence of rash- feet are warmer today but covered with blankets Neurologic: Ox3; severe dysarthria- still hard ot udnerstand , motor strength is 4/5 in RIght 0/5 left  deltoid, bicep, tricep, grip, 4/5 RIght , 3- Left hip flexor, knee extensors,4/5 RIght and 0/5 left  ankle dorsiflexor and plantar flexor Sensory exam normal sensation to light touch and proprioception in bilateral upper and lower extremities Cerebellar exam normal finger to nose to finger as well as heel to shin in bilateral upper and lower extremities Musculoskeletal: Full range of motion in all 4 extremities. No joint swelling    Assessment/Plan: 1. Functional  deficits secondary to Right MCA infarct  which require 3+ hours per day of interdisciplinary therapy in a comprehensive inpatient rehab setting.  Physiatrist is providing close team supervision and 24 hour management of active medical problems listed below.  Physiatrist and rehab team continue to assess barriers to discharge/monitor patient progress toward functional and medical goals  Care Tool:  Bathing    Body parts bathed by patient: Left arm, Chest, Abdomen, Right upper leg, Left upper leg, Face   Body parts bathed by helper: Right arm, Front perineal area, Buttocks, Right lower leg, Left lower leg     Bathing assist Assist Level: 2 Helpers     Upper Body Dressing/Undressing Upper body dressing   What is the patient wearing?: (Zip up jacket)    Upper body assist Assist Level: Total Assistance - Patient < 25%    Lower Body Dressing/Undressing Lower body dressing      What is the patient wearing?: Incontinence brief     Lower body assist Assist for lower body dressing: 2 Helpers     Toileting Toileting    Toileting assist Assist for toileting: Dependent - Patient 0%     Transfers Chair/bed transfer  Transfers assist     Chair/bed transfer assist level: 2 Helpers     Locomotion Ambulation   Ambulation assist   Ambulation activity did not occur: Safety/medical concerns(poor balance/high fall risk)  Assist level: 2 helpers Assistive device: Other (comment)(R hallway rail) Max distance:  48ft   Walk 10 feet activity   Assist  Walk 10 feet activity did not occur: Safety/medical concerns(poor balance/high fall risk)  Assist level: 2 helpers Assistive device: Other (comment)(R hallway rail)   Walk 50 feet activity   Assist Walk 50 feet with 2 turns activity did not occur: Safety/medical concerns(poor balance/high fall risk)         Walk 150 feet activity   Assist Walk 150 feet activity did not occur: Safety/medical concerns(poor  balance/high fall risk)         Walk 10 feet on uneven surface  activity   Assist Walk 10 feet on uneven surfaces activity did not occur: Safety/medical concerns(poor balance/high fall risk)         Wheelchair     Assist Will patient use wheelchair at discharge?: Yes Type of Wheelchair: Manual    Wheelchair assist level: Total Assistance - Patient < 25% Max wheelchair distance: 32ft    Wheelchair 50 feet with 2 turns activity    Assist        Assist Level: Total Assistance - Patient < 25%   Wheelchair 150 feet activity     Assist      Assist Level: Total Assistance - Patient < 25%   Blood pressure (!) 181/59, pulse 70, temperature 98 F (36.7 C), temperature source Oral, resp. rate 18, height 5\' 6"  (1.676 m), weight 52.3 kg, SpO2 97 %.   Medical Problem List and Plan: 1.  Left hemiparesis with sensory deficits, L-HH with right inattention, pusher tendencies to the left/back, severe dysarthria secondary to right MCA infarct.             -patient may shower             -ELOS/Goals: 18-23 days/min/mod A.             CIR evals PT, OT . SLP 2.  Antithrombotics: -DVT/anticoagulation:  Pharmaceutical: Lovenox             -antiplatelet therapy: ASA daily.  3. Pain Management: Tylenol prn.  4. Mood: LCSW to follow for evaluation and support.              -antipsychotic agents: N/A 5. Neuropsych: This patient is? capable of making decisions on her own behalf. 6. Skin/Wound Care: Routine pressure relief measure.  7. Fluids/Electrolytes/Nutrition: Monitor I/O.  CMP ordered for tomorrow a.m.--may need IVF at nights for hydration.  8. HTN: Monitor BP tid--continue hydralazine and BB  3/13- BP 150s/50s-60s- is getting better controlled- con't BP meds             3/14- BP running 150s-180s systolic- don't want to increase Bblocker and reduce pulse too much- will wait and follow trned, because yesterday was doing better Vitals:   11/08/19 2110 11/09/19 0331   BP: (!) 152/54 (!) 181/59  Pulse: 77 70  Resp:  18  Temp:  98 F (36.7 C)  SpO2:  97%   9. Chronic diastolic CHF/Valvular disease: Monitor weights daily with I/O. Continue hydralazine, metoprolol, pravastatin and ASA. No ARB/ACE due to CKD.              Daily weights Filed Weights   11/05/19 2111 11/09/19 0331  Weight: 55 kg 52.3 kg    3/13- no weight since 3/10- will verify it's daily weights 10. Dyslipidemia: On pravastatin.  11. GERD: resume PPI.  12. Chronic constipation: Continue Senna.  Adjust bowel meds as necessary. 13. CKD III: Monitor renal status with serial checks.  CMP ordered. 14. GU: urinary retention  DC foley in am and start bladder training.   UA +, few bacteria, nitrite neg, afeb but Ucx 100K GNR, start keflex (allergy to sulfa)   3/13- Pharmacy called- resistant to Keflex- changed ot Unasyn since is sensitive- for an additional 5 days since hasn't been treated so far.   3/14- tolerating with no side effects- WBC down somewhat 15.  Post stroke dysphagia- given  degree of dysarthria expect severe oral phase issues as well              Pure with honey thick liquids, advance diet as tolerated   3/13- at risk for dehydration even though on IVFs 12 hours/day- will recheck BMP. Esp since on IV ABX.   3/14- BUN down to 31 from 36- somewhat better dehydration- con't meds  LOS: 4 days A FACE TO FACE EVALUATION WAS PERFORMED  Sarah Delacruz 11/09/2019, 1:02 PM

## 2019-11-09 NOTE — Plan of Care (Signed)
  Problem: Consults Goal: RH STROKE PATIENT EDUCATION Description: See Patient Education module for education specifics  Outcome: Progressing   Problem: RH BOWEL ELIMINATION Goal: RH STG MANAGE BOWEL WITH ASSISTANCE Description: STG Manage Bowel with min Assistance. Outcome: Progressing Goal: RH STG MANAGE BOWEL W/MEDICATION W/ASSISTANCE Description: STG Manage Bowel with Medication with min Assistance. Outcome: Progressing   Problem: RH BLADDER ELIMINATION Goal: RH STG MANAGE BLADDER WITH ASSISTANCE Description: STG Manage Bladder With min Assistance Outcome: Progressing   Problem: RH SKIN INTEGRITY Goal: RH STG SKIN FREE OF INFECTION/BREAKDOWN Description: Patients skin will remain free from further infection or breakdown with min assist. Outcome: Progressing Goal: RH STG MAINTAIN SKIN INTEGRITY WITH ASSISTANCE Description: STG Maintain Skin Integrity With min Assistance. Outcome: Progressing Goal: RH STG ABLE TO PERFORM INCISION/WOUND CARE W/ASSISTANCE Description: STG Able To Perform Incision/Wound Care With min Assistance. Outcome: Progressing   Problem: RH SAFETY Goal: RH STG ADHERE TO SAFETY PRECAUTIONS W/ASSISTANCE/DEVICE Description: STG Adhere to Safety Precautions With supervision Assistance/Device. Outcome: Progressing   Problem: RH KNOWLEDGE DEFICIT Goal: RH STG INCREASE KNOWLEDGE OF HYPERTENSION Description: Patient/caregiver will verbalize understanding of management of HTN including diet, exercise, medications, monitoring, and follow up care with min assist. Outcome: Progressing Goal: RH STG INCREASE KNOWLEDGE OF DYSPHAGIA/FLUID INTAKE Description: Patient/caregiver will verbalize understanding of management of dysphagia as guided by SLP. Outcome: Progressing Goal: RH STG INCREASE KNOWLEGDE OF HYPERLIPIDEMIA Description: Patient/caregiver will verbalize understanding of management of HLD including diet, exercise, medications, monitoring, and follow up care  with min assist. Outcome: Progressing   

## 2019-11-09 NOTE — Progress Notes (Signed)
Speech Language Pathology Daily Session Note  Patient Details  Name: Bellamie Turney MRN: [id number] Date of Birth: [birth year]  Today's Date: 11/09/2019 SLP Individual Time: 1415-1445 SLP Individual Time Calculation (min): 30 min  Short Term Goals: Week 1: SLP Short Term Goal 1 (Week 1): Pt will consume current diet minimal overt s/sx aspiration and efficient mastication and oral clearance of solids with Max A multimodal cues for use of swallow strategies. SLP Short Term Goal 2 (Week 1): Pt will sustain attention to tasks for 15 minute intervals with Mod A cues for redirection. SLP Short Term Goal 3 (Week 1): Pt will demonstrate ability to recall new and daily information with Max A multimodal cues for compensatory strategies. SLP Short Term Goal 4 (Week 1): Pt will demonstrate ability to problem solve during basic familiar tasks with Max A verbal/visual cues. SLP Short Term Goal 5 (Week 1): Pt will scan left visual field and locate items on left side of environment in 50% opportunities with Max A multimodal cues. SLP Short Term Goal 6 (Week 1): Pt will communicate at the word and phrase level with Max A cues for use of speech intelligibility strategies to achieve ~60% intelligibility.  Skilled Therapeutic Interventions: Pt was seen for skilled ST targeting speech goals. Pt's daughter was present and supportive at bedside throughout session. Pt able to verbally recall 1/3 speech intelligibility strategies (slow rate). Overall Max A cues for overarticulation and slow rate provided in pt's production of 2-syllable words. Specifically, pt required most cues for articulatory precision of consonant clusters and those containing alveolar consonants /t/ and /d/. Pt was ~60% intelligible at the word level, with ineligibility decreasing to ~30-40% at short phrase and sentence level. Pt left laying in bed with alarm set and needs within reach, daughter still present. Continue per current plan of care.         Pain Pain Assessment Pain Scale: 0-10 Pain Score: 0-No pain  Therapy/Group: Individual Therapy  Little Ishikawa 11/09/2019, 12:24 PM

## 2019-11-10 ENCOUNTER — Inpatient Hospital Stay (HOSPITAL_COMMUNITY): Payer: Medicare Other

## 2019-11-10 ENCOUNTER — Inpatient Hospital Stay (HOSPITAL_COMMUNITY): Payer: Medicare Other | Admitting: Occupational Therapy

## 2019-11-10 ENCOUNTER — Inpatient Hospital Stay (HOSPITAL_COMMUNITY): Payer: Medicare Other | Admitting: Speech Pathology

## 2019-11-10 LAB — CBC
HCT: 37.5 % (ref 36.0–46.0)
Hemoglobin: 11.8 g/dL — ABNORMAL LOW (ref 12.0–15.0)
MCH: 29.8 pg (ref 26.0–34.0)
MCHC: 31.5 g/dL (ref 30.0–36.0)
MCV: 94.7 fL (ref 80.0–100.0)
Platelets: 390 10*3/uL (ref 150–400)
RBC: 3.96 MIL/uL (ref 3.87–5.11)
RDW: 13.7 % (ref 11.5–15.5)
WBC: 10 10*3/uL (ref 4.0–10.5)
nRBC: 0 % (ref 0.0–0.2)

## 2019-11-10 MED ORDER — HYDRALAZINE HCL 25 MG PO TABS
25.0000 mg | ORAL_TABLET | Freq: Three times a day (TID) | ORAL | Status: DC
  Administered 2019-11-10 – 2019-11-17 (×23): 25 mg via ORAL
  Filled 2019-11-10 (×25): qty 1

## 2019-11-10 MED ORDER — GENERIC EXTERNAL MEDICATION
Status: DC
Start: ? — End: 2019-11-10

## 2019-11-10 NOTE — Plan of Care (Signed)
  Problem: Consults Goal: RH STROKE PATIENT EDUCATION Description: See Patient Education module for education specifics  Outcome: Progressing   Problem: RH BOWEL ELIMINATION Goal: RH STG MANAGE BOWEL WITH ASSISTANCE Description: STG Manage Bowel with min Assistance. Outcome: Progressing Goal: RH STG MANAGE BOWEL W/MEDICATION W/ASSISTANCE Description: STG Manage Bowel with Medication with min Assistance. Outcome: Progressing   Problem: RH BLADDER ELIMINATION Goal: RH STG MANAGE BLADDER WITH ASSISTANCE Description: STG Manage Bladder With min Assistance Outcome: Progressing   Problem: RH SKIN INTEGRITY Goal: RH STG SKIN FREE OF INFECTION/BREAKDOWN Description: Patients skin will remain free from further infection or breakdown with min assist. Outcome: Progressing Goal: RH STG MAINTAIN SKIN INTEGRITY WITH ASSISTANCE Description: STG Maintain Skin Integrity With min Assistance. Outcome: Progressing Goal: RH STG ABLE TO PERFORM INCISION/WOUND CARE W/ASSISTANCE Description: STG Able To Perform Incision/Wound Care With min Assistance. Outcome: Progressing   Problem: RH SAFETY Goal: RH STG ADHERE TO SAFETY PRECAUTIONS W/ASSISTANCE/DEVICE Description: STG Adhere to Safety Precautions With supervision Assistance/Device. Outcome: Progressing   Problem: RH KNOWLEDGE DEFICIT Goal: RH STG INCREASE KNOWLEDGE OF HYPERTENSION Description: Patient/caregiver will verbalize understanding of management of HTN including diet, exercise, medications, monitoring, and follow up care with min assist. Outcome: Progressing Goal: RH STG INCREASE KNOWLEDGE OF DYSPHAGIA/FLUID INTAKE Description: Patient/caregiver will verbalize understanding of management of dysphagia as guided by SLP. Outcome: Progressing Goal: RH STG INCREASE KNOWLEGDE OF HYPERLIPIDEMIA Description: Patient/caregiver will verbalize understanding of management of HLD including diet, exercise, medications, monitoring, and follow up care  with min assist. Outcome: Progressing   

## 2019-11-10 NOTE — Progress Notes (Signed)
Occupational Therapy Session Note  Patient Details  Name: Sarah Delacruz MRN: [id number] Date of Birth: 11/18/1923  Today's Date: 11/10/2019 OT Individual Time: 0900-1015 OT Individual Time Calculation (min): 75 min    Short Term Goals: Week 1:  OT Short Term Goal 1 (Week 1): Patient will complete UB dressing with Mod A in supported sitting with no more than Mod multimodal cues. OT Short Term Goal 2 (Week 1): Patient will demonstrate dynamic sitting balance unsupported with Mod A in prep for UB bathing/dressing tasks. OT Short Term Goal 3 (Week 1): Patient will perform bed mobility with 1 person assist in prep for ADL tasks. OT Short Term Goal 4 (Week 1): Patient will complete sit > stand transfers with Mod A +2 helpers.  Skilled Therapeutic Interventions/Progress Updates:  Patient received lying supine in bed in agreement with OT treatment session. Supine to EOB with Max A for advancement of BLE toward EOB and to elevate trunk to sitting. Patient initially with strong L posterolateral lean requiring Max multimodal cueing for upright trunk posture. Patient required Max multimodal cueing and Max A +2 for stand-pivot transfer with BLE knee block from EOB to wc on R. Patient completed UB dressing seated in wc with Max A and education on hemi-dressing techniques requiring tactile, visual, and verbal cues. LB dressing with +2 helpers for standing to hike pants over hips. Patient able to follow 1-step verbal cues with greater accuracy this date. Patient transported to therapy gym in wc with total assist. Stand-pivot transfer to mat table with Max A +2 and multimodal cues for pushing from base of support, power negotiation,and forward lean. Dynamic sitting balance with incorporation of reaching/bending, visual scanning, and L attention to improve strength of postural musculature in prep for completion of ADLs in sitting. Return to supine with Max A with patient able to initiate movement of LUE toward bed  surface. Patient indicating need to BM requiring Max A to roll to R  For placement of bedpan. Patient left in long-sitting in bed on bed pan with nursing staff aware, bed alarm on, and call bell within reach.  Therapy Documentation Precautions:  Precautions Precautions: Fall, Other (comment)(Swallow precautions.) Precaution Comments: L neglect/inattention, likely HH, impulsive, L hemi, pusher Restrictions Weight Bearing Restrictions: No   Therapy/Group: Individual Therapy  Sarah Delacruz 11/10/2019, 7:51 AM

## 2019-11-10 NOTE — Progress Notes (Signed)
Physical Therapy Session Note  Patient Details  Name: Sarah Delacruz MRN: [id number] Date of Birth: 03/23/1924  Today's Date: 11/10/2019 PT Individual Time: 1120-1203 PT Individual Time Calculation (min): 43 min   Short Term Goals: Week 1:  PT Short Term Goal 1 (Week 1): Will be able to perform bed mobility with ModA and HOB elevated PT Short Term Goal 2 (Week 1): Will be able to maintain dynamic sittting balance with min guard for 7 minutes PT Short Term Goal 3 (Week 1): Will be able to transfer with ModAx2  Skilled Therapeutic Interventions/Progress Updates: Pt presented in bed agreeable to therapy. Pt denies pain during session. Performed supine to sit on R with modA and use of bed features with max cues for sequencing. Performed squat pivot to w/c maxA as pt grabbed onto bed rail and would not let go despite max cues. Pt transported to rehab gym hallway and initiated STS at wall rail. Pt noted to have significant posterior lean and difficulty motor planning placement and maintaining LLE under pt. Despite second perform present providing cues for LLE placement pt unable to wt shift and wt bear through LLE. PTA then transported pt to ortho gyn for trial of standing frame. Pt tolerated standing frame for approx 7 min with pt able to perform glute sets, TKE, and PTA providing facilitation of wt shifting to L. Pt then returned to hallway and was able to participate in ambulation at wall with modA x 39f. PTA facilitated wt shift to L, and provided facilitation at hips for improved erect posture. Pt transported back to room and performed squat pivot to R into bed modA and modA for sit to supine with HOB elevated. Pt positioned to comfort and left with bed alarm on, call bell within reach and needs met.      Therapy Documentation Precautions:  Precautions Precautions: Fall, Other (comment)(Swallow precautions.) Precaution Comments: L neglect/inattention, likely HH, impulsive, L hemi,  pusher Restrictions Weight Bearing Restrictions: No General:   Vital Signs: Therapy Vitals Temp: 99.3 F (37.4 C) Temp Source: Oral Pulse Rate: 72 Resp: 16 BP: (!) 138/50 Patient Position (if appropriate): Lying Oxygen Therapy SpO2: 99 % O2 Device: Room Air    Therapy/Group: Individual Therapy  [name]  Armend Hochstatter, PTA  11/10/2019, 4:11 PM

## 2019-11-10 NOTE — Progress Notes (Signed)
Speech Language Pathology Daily Session Note  Patient Details  Name: Sarah Delacruz MRN: [id number] Date of Birth: [birth year]  Today's Date: 11/10/2019 SLP Individual Time: 1400-1502 SLP Individual Time Calculation (min): 62 min  Short Term Goals: Week 1: SLP Short Term Goal 1 (Week 1): Pt will consume current diet minimal overt s/sx aspiration and efficient mastication and oral clearance of solids with Max A multimodal cues for use of swallow strategies. SLP Short Term Goal 2 (Week 1): Pt will sustain attention to tasks for 15 minute intervals with Mod A cues for redirection. SLP Short Term Goal 3 (Week 1): Pt will demonstrate ability to recall new and daily information with Max A multimodal cues for compensatory strategies. SLP Short Term Goal 4 (Week 1): Pt will demonstrate ability to problem solve during basic familiar tasks with Max A verbal/visual cues. SLP Short Term Goal 5 (Week 1): Pt will scan left visual field and locate items on left side of environment in 50% opportunities with Max A multimodal cues. SLP Short Term Goal 6 (Week 1): Pt will communicate at the word and phrase level with Max A cues for use of speech intelligibility strategies to achieve ~60% intelligibility.  Skilled Therapeutic Interventions: Pt was seen for skilled ST targeting dysphagia and cognitive goals. Although suction toothbrush was used to complete oral care, pt exhibited prolonged coughing episode near end of brushing, suspect due to difficulty coordinating management of secretions in conjunction with oral care. Pt consumed ice chips X4 with intermittent delayed throat clearing, but it was difficult to determine whether throat clearing was directly linked to ice chip trials or residual from coughing with oral care. Pt also consumed a whole vanilla pudding cup with slightly prolonged but improved oral transit of boluses and minimal left anterior loss. Mod A verbal cues provided to position bolus in center of oral  cavity to correct anterior spillage due to significant left facial weakness and asymmetry. No left buccal pocketing of pudding observed today. Recommend continue current diet.  Pt matched colored blocks to a 9-color pattern board with overall Mod A verbal cues for locating patterns and colored blocks to left of midline. Pt also followed 2-step directions related to color board task with Min A verbal cues for recall. Overall Min A verbal cues provided for redirection to tasks throughout session. Pt left laying semi-reclined in bed with alarm set and needs within reach. Continue per current plan of care.        Pain Pain Assessment Pain Scale: 0-10 Pain Score: 0-No pain  Therapy/Group: Individual Therapy  Sarah Delacruz 11/10/2019, 12:34 PM

## 2019-11-10 NOTE — Progress Notes (Signed)
[facility]ICINE [facility]ABILITATION PROGRESS NOTE   Subjective/Complaints:   Discussed swallowing issues and dysphagia with pt , poor fluid intake, received IVF on 3/13 Meal intake 25-50%, no cough with meal   ROS-   Pt denies SOB, abd pain, CP, N/V/C/D, and vision changes  Objective:   No results found. Recent Labs    11/09/19 0600 11/10/19 0600  WBC 9.5 10.0  HGB 11.5* 11.8*  HCT 37.0 37.5  PLT 391 390   Recent Labs    11/09/19 0600  NA 138  K 4.9  CL 104  CO2 22  GLUCOSE 104*  BUN 31*  CREATININE 1.15*  CALCIUM 8.7*    Intake/Output Summary (Last 24 hours) at 11/10/2019 1022 Last data filed at 11/10/2019 0900 Gross per 24 hour  Intake 266 ml  Output 1650 ml  Net -1384 ml     Physical Exam: Vital Signs Blood pressure (!) 178/62, pulse 78, temperature 98.3 F (36.8 C), temperature source Oral, resp. rate 18, height 5\' 6"  (1.676 m), weight 52.1 kg, SpO2 100 %.   General: No acute distress;sitting up in bed; more appropriate, dysarthric- hard to understand, NAD Psychiatric- more with it/awake- dysarthria noted still Heart: RRR< no MR/G, no JVD Lungs: CTA B/L- no W/R/R- good air movement Abdomen: Soft, NT, ND, (+)BS  Extremities: No clubbing, cyanosis, or edema Skin: No evidence of breakdown, no evidence of rash- feet are warmer today but covered with blankets Neurologic: Ox3; severe dysarthria- still hard ot udnerstand , motor strength is 4/5 in RIght 0/5 left  deltoid, bicep, tricep, grip, 4/5 RIght , 3- Left hip flexor, knee extensors,4/5 RIght and 0/5 left  ankle dorsiflexor and plantar flexor Sensory exam normal sensation to light touch and proprioception in bilateral upper and lower extremities Cerebellar exam normal finger to nose to finger as well as heel to shin in bilateral upper and lower extremities Musculoskeletal: Full range of motion in all 4 extremities. No joint swelling    Assessment/Plan: 1. Functional deficits secondary to  Right MCA infarct  which require 3+ hours per day of interdisciplinary therapy in a comprehensive inpatient rehab setting.  Physiatrist is providing close team supervision and 24 hour management of active medical problems listed below.  Physiatrist and rehab team continue to assess barriers to discharge/monitor patient progress toward functional and medical goals  Care Tool:  Bathing    Body parts bathed by patient: Left arm, Chest, Abdomen, Right upper leg, Left upper leg, Face   Body parts bathed by helper: Right arm, Front perineal area, Buttocks, Right lower leg, Left lower leg     Bathing assist Assist Level: 2 Helpers     Upper Body Dressing/Undressing Upper body dressing   What is the patient wearing?: (Zip up jacket)    Upper body assist Assist Level: Total Assistance - Patient < 25%    Lower Body Dressing/Undressing Lower body dressing      What is the patient wearing?: Incontinence brief     Lower body assist Assist for lower body dressing: 2 Helpers     Toileting Toileting    Toileting assist Assist for toileting: Dependent - Patient 0%     Transfers Chair/bed transfer  Transfers assist     Chair/bed transfer assist level: 2 Helpers     Locomotion Ambulation   Ambulation assist   Ambulation activity did not occur: Safety/medical concerns(poor balance/high fall risk)  Assist level: 2 helpers Assistive device: Other (comment)(R hallway rail) Max distance: 60ft  Walk 10 feet activity   Assist  Walk 10 feet activity did not occur: Safety/medical concerns(poor balance/high fall risk)  Assist level: 2 helpers Assistive device: Other (comment)(R hallway rail)   Walk 50 feet activity   Assist Walk 50 feet with 2 turns activity did not occur: Safety/medical concerns(poor balance/high fall risk)         Walk 150 feet activity   Assist Walk 150 feet activity did not occur: Safety/medical concerns(poor balance/high fall risk)          Walk 10 feet on uneven surface  activity   Assist Walk 10 feet on uneven surfaces activity did not occur: Safety/medical concerns(poor balance/high fall risk)         Wheelchair     Assist Will patient use wheelchair at discharge?: Yes Type of Wheelchair: Manual    Wheelchair assist level: Total Assistance - Patient < 25% Max wheelchair distance: 101ft    Wheelchair 50 feet with 2 turns activity    Assist        Assist Level: Total Assistance - Patient < 25%   Wheelchair 150 feet activity     Assist      Assist Level: Total Assistance - Patient < 25%   Blood pressure (!) 178/62, pulse 78, temperature 98.3 F (36.8 C), temperature source Oral, resp. rate 18, height 5\' 6"  (1.676 m), weight 52.1 kg, SpO2 100 %.   Medical Problem List and Plan: 1.  Left hemiparesis with sensory deficits, L-HH with right inattention, pusher tendencies to the left/back, severe dysarthria secondary to right MCA infarct.             -patient may shower             -ELOS/Goals: 18-23 days/min/mod A.             CIR evals PT, OT . SLP 2.  Antithrombotics: -DVT/anticoagulation:  Pharmaceutical: Lovenox             -antiplatelet therapy: ASA daily.  3. Pain Management: Tylenol prn.  4. Mood: LCSW to follow for evaluation and support.              -antipsychotic agents: N/A 5. Neuropsych: This patient is? capable of making decisions on her own behalf. 6. Skin/Wound Care: Routine pressure relief measure.  7. Fluids/Electrolytes/Nutrition: Monitor I/O.  CMP ordered for tomorrow a.m.--may need IVF at nights for hydration.  8. HTN: Monitor BP tid--continue hydralazine and BB (also was on microzide in the past)   Vitals:   11/09/19 1956 11/10/19 0544  BP: (!) 154/56 (!) 178/62  Pulse: 71 78  Resp: 18 18  Temp: 98 F (36.7 C) 98.3 F (36.8 C)  SpO2:  100%  systolic elevation increase hydralazine to 25mg  TID  9. Chronic diastolic CHF/Valvular disease: Monitor weights daily  with I/O. Continue hydralazine, metoprolol, pravastatin and ASA. No ARB/ACE due to CKD.              Daily weights Filed Weights   11/05/19 2111 11/09/19 0331 11/10/19 0544  Weight: 55 kg 52.3 kg 52.1 kg    3/13- no weight since 3/10- will verify it's daily weights 10. Dyslipidemia: On pravastatin.  11. GERD: resume PPI.  12. Chronic constipation: Continue Senna.  Adjust bowel meds as necessary. 13. CKD III: Monitor renal status with serial checks.  CMP ordered. 14. GU: urinary retention  DC foley in am and start bladder training.    15.  Post stroke dysphagia- given  degree of  dysarthria expect severe oral phase issues as well              Pure with honey thick liquids, advance diet as tolerated  will discuss f/u MBS with SLP this week , oral phase may be main issue   LOS: 5 days A FACE TO FACE EVALUATION WAS PERFORMED  Erick Colace 11/10/2019, 10:22 AM

## 2019-11-10 NOTE — Plan of Care (Signed)
  Problem: Consults Goal: RH STROKE PATIENT EDUCATION Description: See Patient Education module for education specifics  Outcome: Progressing   Problem: RH BOWEL ELIMINATION Goal: RH STG MANAGE BOWEL WITH ASSISTANCE Description: STG Manage Bowel with min Assistance. Outcome: Progressing Goal: RH STG MANAGE BOWEL W/MEDICATION W/ASSISTANCE Description: STG Manage Bowel with Medication with min Assistance. Outcome: Progressing   Problem: RH BLADDER ELIMINATION Goal: RH STG MANAGE BLADDER WITH ASSISTANCE Description: STG Manage Bladder With min Assistance Outcome: Progressing   Problem: RH SKIN INTEGRITY Goal: RH STG SKIN FREE OF INFECTION/BREAKDOWN Description: Patients skin will remain free from further infection or breakdown with min assist. Outcome: Progressing Goal: RH STG MAINTAIN SKIN INTEGRITY WITH ASSISTANCE Description: STG Maintain Skin Integrity With min Assistance. Outcome: Progressing Goal: RH STG ABLE TO PERFORM INCISION/WOUND CARE W/ASSISTANCE Description: STG Able To Perform Incision/Wound Care With min Assistance. Outcome: Progressing   Problem: RH SAFETY Goal: RH STG ADHERE TO SAFETY PRECAUTIONS W/ASSISTANCE/DEVICE Description: STG Adhere to Safety Precautions With supervision Assistance/Device. Outcome: Progressing   Problem: RH KNOWLEDGE DEFICIT Goal: RH STG INCREASE KNOWLEDGE OF HYPERTENSION Description: Patient/caregiver will verbalize understanding of management of HTN including diet, exercise, medications, monitoring, and follow up care with min assist. Outcome: Progressing Goal: RH STG INCREASE KNOWLEDGE OF DYSPHAGIA/FLUID INTAKE Description: Patient/caregiver will verbalize understanding of management of dysphagia as guided by SLP. Outcome: Progressing Goal: RH STG INCREASE KNOWLEGDE OF HYPERLIPIDEMIA Description: Patient/caregiver will verbalize understanding of management of HLD including diet, exercise, medications, monitoring, and follow up care  with min assist. Outcome: Progressing

## 2019-11-11 ENCOUNTER — Inpatient Hospital Stay (HOSPITAL_COMMUNITY): Payer: Medicare Other | Admitting: Speech Pathology

## 2019-11-11 ENCOUNTER — Inpatient Hospital Stay (HOSPITAL_COMMUNITY): Payer: Medicare Other

## 2019-11-11 ENCOUNTER — Inpatient Hospital Stay (HOSPITAL_COMMUNITY): Payer: Medicare Other | Admitting: Occupational Therapy

## 2019-11-11 LAB — GLUCOSE, CAPILLARY: Glucose-Capillary: 118 mg/dL — ABNORMAL HIGH (ref 70–99)

## 2019-11-11 MED ORDER — TERAZOSIN HCL 1 MG PO CAPS
1.0000 mg | ORAL_CAPSULE | Freq: Every day | ORAL | Status: DC
  Administered 2019-11-11 – 2019-11-17 (×7): 1 mg via ORAL
  Filled 2019-11-11 (×8): qty 1

## 2019-11-11 MED ORDER — GENERIC EXTERNAL MEDICATION
Status: DC
Start: ? — End: 2019-11-11

## 2019-11-11 NOTE — Progress Notes (Signed)
Occupational Therapy Session Note  Patient Details  Name: Sarah Delacruz MRN: [id number] Date of Birth: 04/11/1924  Today's Date: 11/11/2019 OT Individual Time: 1451-1533 OT Individual Time Calculation (min): 42 min    Short Term Goals: Week 1:  OT Short Term Goal 1 (Week 1): Patient will complete UB dressing with Mod A in supported sitting with no more than Mod multimodal cues. OT Short Term Goal 2 (Week 1): Patient will demonstrate dynamic sitting balance unsupported with Mod A in prep for UB bathing/dressing tasks. OT Short Term Goal 3 (Week 1): Patient will perform bed mobility with 1 person assist in prep for ADL tasks. OT Short Term Goal 4 (Week 1): Patient will complete sit > stand transfers with Mod A +2 helpers.  Skilled Therapeutic Interventions/Progress Updates:    Pt up in tilt in space wheelchair to start session.  Therapist transported her down to the therapy gym and worked on squat pivot transfer to the right to the therapy mat.  She needed total assist +2 (pt 40%) for transfer with increased pushing to the left noted during transfer as well as decreased forward trunk flexion.  Therapist incorporated Bobath transfer technique for blocking her LEs as well as reinforcing forward trunk flexion.  While sitting EOM pt demonstrates posterior pelvic tilt with increased weightshift onto the left hip.  She tends to also extend her left knee, requiring max facilitation to bring in to flexion and maintain contact with the floor.  Increased tightness noted in ankle planter flexors as well as the left heel would not maintain contact with the floor when bringing the LLE back under her into knee flexion.  Incorporated simple movements of trunk flexion, having pt reach to target with the RUE, while not activating the knee extensors.  Progressed to having her reach to a chair, and after completing trunk flexion, then had her work on activation of sit to stand with the knee extensors and hip flexors.   Pt maintains flexed trunk, head, and knees in standing.  Increased pushing to the left side also noted.  Total assist +2 (pt 30%) for transition to standing and to maintain standing for approximately 2 mins.  Gave pt a second target to reach up to and laterally to the right side while standing in order to help upright posture and weightshifting to the pushing right side.   Attempted sit to stand via three muskateers method as well, but pt was not able to tolerate.  Returned to the wheelchair with squat pivot transfer to the left at total assist +2 (pt 40%) and then returned to the room.  She was then transferred back to the bed at the same level with transition to supine.  Pt left in the bed with her daughter present and call button and phone in reach.  Bed alarm in place as well.  Discussed briefly with pt's daughter her current amount of assist with transfers and expected LOS.  Also talked about hopeful expectations of progress but likelihood of needing mod assist or greater for most tasks.    Therapy Documentation Precautions:  Precautions Precautions: Fall, Other (comment) Precaution Comments: left inattention, extensor patterns in LEs and trunk Restrictions Weight Bearing Restrictions: No  Pain: Pain Assessment Pain Scale: Faces Pain Score: 0-No pain Faces Pain Scale: Hurts a little bit Pain Type: Chronic pain Pain Location: Knee Pain Orientation: Left Pain Descriptors / Indicators: Discomfort Pain Onset: With Activity Pain Intervention(s): Repositioned ADL: See Care Tool Section for some details of mobility  Therapy/Group: Individual Therapy  [place] OTR/L 11/11/2019, 4:08 PM

## 2019-11-11 NOTE — Progress Notes (Signed)
Occupational Therapy Session Note  Patient Details  Name: Sarah Delacruz MRN: [id number] Date of Birth: 11-18-1923  Today's Date: 11/11/2019 OT Individual Time: 1004-1050 OT Individual Time Calculation (min): 46 min    Short Term Goals: Week 1:  OT Short Term Goal 1 (Week 1): Patient will complete UB dressing with Mod A in supported sitting with no more than Mod multimodal cues. OT Short Term Goal 2 (Week 1): Patient will demonstrate dynamic sitting balance unsupported with Mod A in prep for UB bathing/dressing tasks. OT Short Term Goal 3 (Week 1): Patient will perform bed mobility with 1 person assist in prep for ADL tasks. OT Short Term Goal 4 (Week 1): Patient will complete sit > stand transfers with Mod A +2 helpers.  Skilled Therapeutic Interventions/Progress Updates:    Pt completed supine to sit EOB with max assist to start session.  She was able to maintain static sitting with min assist level, but demonstrates posterior pelvic tilt and increased posterior lean.  She demonstrates decreased ability to complete forward trunk flexion in sitting with attempts of LB dressing secondary to extensor tone.  She needed max assist for threading pants over her LEs with total +2 (pt 25%) for standing to pull them up over her hips.  She was only able to achieve partial standing with increased posterior lean/pushing and increased knee flexion present.  Upon sitting, pt was transferred back to supine with max assist secondary to noted bowel incontinence.  She needed mod assist for rolling to the left, with max assist to the right to remove pants and brief.  Therapist provided total assist for cleaning front and peri area and for donning new brief.  Finished session with re-donning pants with total assist as well.  Pt left in the bed with call button and phone in reach with safety alarm in place.     Therapy Documentation Precautions:  Precautions Precautions: Fall, Other (comment) Precaution Comments:  left inattention, extensor patterns in LEs and trunk Restrictions Weight Bearing Restrictions: No  Pain: Pain Assessment Pain Scale: 0-10 Pain Score: 0-No pain ADL: See Care Tool Section for some details of mobility and selfcare  Therapy/Group: Individual Therapy  Manvir Thorson OTR/L 11/11/2019, 12:26 PM

## 2019-11-11 NOTE — Progress Notes (Addendum)
[place] PHYSICAL MEDICINE & REHABILITATION PROGRESS NOTE   Subjective/Complaints:  No issues overnite , pt states she can request bedpan but nsg documents incont BM this am    ROS-   Pt denies SOB, abd pain, CP, N/V//D, and vision changes  Objective:   No results found. Recent Labs    11/09/19 0600 11/10/19 0600  WBC 9.5 10.0  HGB 11.5* 11.8*  HCT 37.0 37.5  PLT 391 390   Recent Labs    11/09/19 0600  NA 138  K 4.9  CL 104  CO2 22  GLUCOSE 104*  BUN 31*  CREATININE 1.15*  CALCIUM 8.7*    Intake/Output Summary (Last 24 hours) at 11/11/2019 0910 Last data filed at 11/11/2019 0700 Gross per 24 hour  Intake 240 ml  Output 1500 ml  Net -1260 ml     Physical Exam: Vital Signs Blood pressure (!) 160/54, pulse 76, temperature 97.9 F (36.6 C), temperature source Oral, resp. rate 18, height 5\' 6"  (1.676 m), weight 52.2 kg, SpO2 98 %.   General: No acute distress;sitting up in bed; more appropriate, dysarthric- hard to understand, NAD Psychiatric- more with it/awake- dysarthria noted still Heart: RRR< no MR/G, no JVD Lungs: CTA B/L- no W/R/R- good air movement Abdomen: Soft, NT, ND, (+)BS  Extremities: No clubbing, cyanosis, or edema Skin: No evidence of breakdown, no evidence of rash- feet are warmer today but covered with blankets Neurologic: Ox3; severe dysarthria- still hard ot udnerstand , motor strength is 4/5 in RIght 0/5 left  deltoid, bicep, tricep, grip, 4/5 RIght , 3- Left hip flexor, knee extensors,4/5 RIght and 0/5 left  ankle dorsiflexor and plantar flexor Sensory exam normal sensation to light touch and proprioception in bilateral upper and lower extremities Cerebellar exam normal finger to nose to finger as well as heel to shin in bilateral upper and lower extremities Musculoskeletal: Full range of motion in all 4 extremities. No joint swelling    Assessment/Plan: 1. Functional deficits secondary to Right MCA infarct  which require 3+ hours  per day of interdisciplinary therapy in a comprehensive inpatient rehab setting.  Physiatrist is providing close team supervision and 24 hour management of active medical problems listed below.  Physiatrist and rehab team continue to assess barriers to discharge/monitor patient progress toward functional and medical goals  Care Tool:  Bathing    Body parts bathed by patient: Left arm, Chest, Abdomen, Right upper leg, Left upper leg, Face   Body parts bathed by helper: Right arm, Front perineal area, Buttocks, Right lower leg, Left lower leg     Bathing assist Assist Level: 2 Helpers     Upper Body Dressing/Undressing Upper body dressing   What is the patient wearing?: Pull over shirt    Upper body assist Assist Level: Maximal Assistance - Patient 25 - 49%(Supported sitting in wc)    Lower Body Dressing/Undressing Lower body dressing      What is the patient wearing?: Incontinence brief, Pants     Lower body assist Assist for lower body dressing: 2 Helpers     Toileting Toileting    Toileting assist Assist for toileting: Dependent - Patient 0%     Transfers Chair/bed transfer  Transfers assist     Chair/bed transfer assist level: 2 Helpers     Locomotion Ambulation   Ambulation assist   Ambulation activity did not occur: Safety/medical concerns(poor balance/high fall risk)  Assist level: 2 helpers Assistive device: Other (comment)(R hallway rail) Max distance: 44ft  Walk 10 feet activity   Assist  Walk 10 feet activity did not occur: Safety/medical concerns(poor balance/high fall risk)  Assist level: 2 helpers Assistive device: Other (comment)(R hallway rail)   Walk 50 feet activity   Assist Walk 50 feet with 2 turns activity did not occur: Safety/medical concerns(poor balance/high fall risk)         Walk 150 feet activity   Assist Walk 150 feet activity did not occur: Safety/medical concerns(poor balance/high fall risk)          Walk 10 feet on uneven surface  activity   Assist Walk 10 feet on uneven surfaces activity did not occur: Safety/medical concerns(poor balance/high fall risk)         Wheelchair     Assist Will patient use wheelchair at discharge?: Yes Type of Wheelchair: Manual    Wheelchair assist level: Total Assistance - Patient < 25% Max wheelchair distance: 53ft    Wheelchair 50 feet with 2 turns activity    Assist        Assist Level: Total Assistance - Patient < 25%   Wheelchair 150 feet activity     Assist      Assist Level: Total Assistance - Patient < 25%   Blood pressure (!) 160/54, pulse 76, temperature 97.9 F (36.6 C), temperature source Oral, resp. rate 18, height 5\' 6"  (1.676 m), weight 52.2 kg, SpO2 98 %.   Medical Problem List and Plan: 1.  Left hemiparesis with sensory deficits, L-HH with right inattention, pusher tendencies to the left/back, severe dysarthria secondary to right MCA infarct.             -patient may shower             -ELOS/Goals: 18-23 days/min/mod A.             CIR PT, OT . SLP- team conf in am  2.  Antithrombotics: -DVT/anticoagulation:  Pharmaceutical: Lovenox             -antiplatelet therapy: ASA daily.  3. Pain Management: Tylenol prn.  4. Mood: LCSW to follow for evaluation and support.              -antipsychotic agents: N/A 5. Neuropsych: This patient is? capable of making decisions on her own behalf. 6. Skin/Wound Care: Routine pressure relief measure.  7. Fluids/Electrolytes/Nutrition: Monitor I/O.  CMP ordered for tomorrow a.m.--may need IVF at nights for hydration.  8. HTN: Monitor BP tid--continue hydralazine and BB (also was on microzide in the past)   Vitals:   11/10/19 2059 11/11/19 0557  BP: (!) 154/54 (!) 160/54  Pulse: 73 76  Resp: 18 18  Temp: 98.6 F (37 C) 97.9 F (36.6 C)  SpO2: 96% 98%  systolic elevation increase hydralazine to 25mg  TID  9. Chronic diastolic CHF/Valvular disease: Monitor  weights daily with I/O. Continue hydralazine, metoprolol, pravastatin and ASA. No ARB/ACE due to CKD.              Daily weights Filed Weights   11/09/19 0331 11/10/19 0544 11/11/19 0557  Weight: 52.3 kg 52.1 kg 52.2 kg    3/13- no weight since 3/10- will verify it's daily weights 10. Dyslipidemia: On pravastatin.  11. GERD: resume PPI.  12. Chronic constipation: Continue Senna.  Adjust bowel meds as necessary. 13. CKD III: Monitor renal status with serial checks.  CMP ordered. 14. GU: urinary retention ICP add alpha blocker , sulfa all, use terazosin 15.  Post stroke dysphagia- given  degree  of dysarthria expect severe oral phase issues as well              Pure with honey thick liquids, advance diet as tolerated  will discuss f/u MBS with SLP this week , oral phase may be main issue  16.  Bowel incont lkely multifactorial with reduced awareness, delay in notifying nurse and difficulty getting g on bedpan  LOS: 6 days A FACE TO FACE EVALUATION WAS PERFORMED  Erick Colace 11/11/2019, 9:10 AM

## 2019-11-11 NOTE — Progress Notes (Signed)
Physical Therapy Session Note  Patient Details  Name: Sarah Delacruz MRN: [id number] Date of Birth: October 13, 1923  Today's Date: 11/11/2019 PT Individual Time: 1302-1400 PT Individual Time Calculation (min): 58 min   Short Term Goals: Week 1:  PT Short Term Goal 1 (Week 1): Will be able to perform bed mobility with ModA and HOB elevated PT Short Term Goal 2 (Week 1): Will be able to maintain dynamic sittting balance with min guard for 7 minutes PT Short Term Goal 3 (Week 1): Will be able to transfer with ModAx2  Skilled Therapeutic Interventions/Progress Updates:    Pt sitting up in bed upon PT arrival, agreeable to therapy tx and denies pain at rest. Pt finishing eating lunch. Therapist provided pt with TIS w/c this session in order to promote OOB activity tolerance, may benefit from smaller width w/c however this was the only chair available. Pt transferred to sitting EOB with max assist, in sitting pt with posteior lean and extends legs, cues to correct. With cues and facilitation for LE placement, pt able to maintain sitting balance with CGA. Squat pivot to w/c with max assist + 2, therapist providing facilitation for weightshifting. Pt transported to the gym in w/c dependently. Pt performed lateral scoot transfers from TIS<>mat towards the R with mod-max assist and increased time to complete, pt able to initiate scooting while therapist provided facilitation for LE placement and weightshifting. Pt seated edge of mat worked on sitting balance and anterior weightshifting while performing reaching activity with R UE, therapist positioning LEs to limit knee extension, therapist positioning L UE for weightbearing through mat. Pt performed x 2 sit<>stands with max +2 assist from mat (R HHA and 2nd therapist on L side), in standing pt maintains flexed posture with facilitation to encourage extension at the hip. Pt noted to have limited L ankle DF and pronation, in standing unable to keep L heel on the  ground. Pt performed x 1 sit<>stand from mat with eva walker in order to encourage upright posture and increased weightbearing over L side, in standing continued facilitation for hip extension and LE placement. Pt transported back to room and left in TIS w/c with needs in reach and chair alarm set.   Therapy Documentation Precautions:  Precautions Precautions: Fall, Other (comment) Precaution Comments: left inattention, extensor patterns in LEs and trunk Restrictions Weight Bearing Restrictions: No    Therapy/Group: Individual Therapy  Cresenciano Genre, PT, DPT, CSRS 11/11/2019, 2:17 PM

## 2019-11-11 NOTE — Plan of Care (Signed)
  Problem: Consults Goal: RH STROKE PATIENT EDUCATION Description: See Patient Education module for education specifics  Outcome: Progressing   Problem: RH BOWEL ELIMINATION Goal: RH STG MANAGE BOWEL WITH ASSISTANCE Description: STG Manage Bowel with min Assistance. Outcome: Progressing Goal: RH STG MANAGE BOWEL W/MEDICATION W/ASSISTANCE Description: STG Manage Bowel with Medication with min Assistance. Outcome: Progressing   Problem: RH BLADDER ELIMINATION Goal: RH STG MANAGE BLADDER WITH ASSISTANCE Description: STG Manage Bladder With min Assistance Outcome: Progressing   Problem: RH SKIN INTEGRITY Goal: RH STG SKIN FREE OF INFECTION/BREAKDOWN Description: Patients skin will remain free from further infection or breakdown with min assist. Outcome: Progressing Goal: RH STG MAINTAIN SKIN INTEGRITY WITH ASSISTANCE Description: STG Maintain Skin Integrity With min Assistance. Outcome: Progressing Goal: RH STG ABLE TO PERFORM INCISION/WOUND CARE W/ASSISTANCE Description: STG Able To Perform Incision/Wound Care With min Assistance. Outcome: Progressing   Problem: RH SAFETY Goal: RH STG ADHERE TO SAFETY PRECAUTIONS W/ASSISTANCE/DEVICE Description: STG Adhere to Safety Precautions With supervision Assistance/Device. Outcome: Progressing   Problem: RH KNOWLEDGE DEFICIT Goal: RH STG INCREASE KNOWLEDGE OF HYPERTENSION Description: Patient/caregiver will verbalize understanding of management of HTN including diet, exercise, medications, monitoring, and follow up care with min assist. Outcome: Progressing Goal: RH STG INCREASE KNOWLEDGE OF DYSPHAGIA/FLUID INTAKE Description: Patient/caregiver will verbalize understanding of management of dysphagia as guided by SLP. Outcome: Progressing Goal: RH STG INCREASE KNOWLEGDE OF HYPERLIPIDEMIA Description: Patient/caregiver will verbalize understanding of management of HLD including diet, exercise, medications, monitoring, and follow up care  with min assist. Outcome: Progressing   

## 2019-11-11 NOTE — Progress Notes (Signed)
Speech Language Pathology Daily Session Note  Patient Details  Name: Sarah Delacruz MRN: [id number] Date of Birth: [birth year]  Today's Date: 11/11/2019 SLP Individual Time: 0730-0829 SLP Individual Time Calculation (min): 59 min  Short Term Goals: Week 1: SLP Short Term Goal 1 (Week 1): Pt will consume current diet minimal overt s/sx aspiration and efficient mastication and oral clearance of solids with Max A multimodal cues for use of swallow strategies. SLP Short Term Goal 2 (Week 1): Pt will sustain attention to tasks for 15 minute intervals with Mod A cues for redirection. SLP Short Term Goal 3 (Week 1): Pt will demonstrate ability to recall new and daily information with Max A multimodal cues for compensatory strategies. SLP Short Term Goal 4 (Week 1): Pt will demonstrate ability to problem solve during basic familiar tasks with Max A verbal/visual cues. SLP Short Term Goal 5 (Week 1): Pt will scan left visual field and locate items on left side of environment in 50% opportunities with Max A multimodal cues. SLP Short Term Goal 6 (Week 1): Pt will communicate at the word and phrase level with Max A cues for use of speech intelligibility strategies to achieve ~60% intelligibility.  Skilled Therapeutic Interventions: Pt was seen for skilled ST targeting dysphagia and speech goals. Pt demonstrated improved/more timely mastication and oral manipulation of puree breakfast solids, as well as minimal left buccal pocketing. During upgraded Dys 2 solid snack trial, no notable differences in efficiency of mastication and oral clearance noted. Pt also consumed honey thick liquids with no overt s/sx aspiration and improved ability to direct boluses to center of oral cavity ot minimze left anterior loss. Recommend trial of a larger portion prior to full upgrade, continue current diet for now.  Pt required Mod A verbal cues for recall of 2/3 speech intelligibility strategies. During a barrier picture  description task she was ~75% intelligible at the word and phrase levels with Mod A verbal cues for repetition using overarticulation for more precise articulatory precision of multisyllabic words containing consonants /t/ /p/ and /v/. Education and verbal cues provided regarding articulation of consonant clusters as well. Pt left laying in bed with alarm set and needs within reach. Continue per current plan of care.       Pain Pain Assessment Pain Scale: 0-10 Pain Score: 0-No pain  Therapy/Group: Individual Therapy  Little Ishikawa 11/11/2019, 7:22 AM

## 2019-11-12 ENCOUNTER — Inpatient Hospital Stay (HOSPITAL_COMMUNITY): Payer: Medicare Other | Admitting: Occupational Therapy

## 2019-11-12 ENCOUNTER — Inpatient Hospital Stay (HOSPITAL_COMMUNITY): Payer: Medicare Other | Admitting: Speech Pathology

## 2019-11-12 ENCOUNTER — Inpatient Hospital Stay (HOSPITAL_COMMUNITY): Payer: Medicare Other

## 2019-11-12 ENCOUNTER — Inpatient Hospital Stay (HOSPITAL_COMMUNITY): Payer: Medicare Other | Admitting: Physical Therapy

## 2019-11-12 MED ORDER — AMOXICILLIN-POT CLAVULANATE 500-125 MG PO TABS
1.0000 | ORAL_TABLET | Freq: Three times a day (TID) | ORAL | Status: AC
  Administered 2019-11-12 – 2019-11-13 (×5): 500 mg via ORAL
  Filled 2019-11-12 (×5): qty 1

## 2019-11-12 MED ORDER — GENERIC EXTERNAL MEDICATION
Status: DC
Start: ? — End: 2019-11-12

## 2019-11-12 NOTE — Patient Care Conference (Signed)
Inpatient RehabilitationTeam Conference and Plan of Care Update Date: 11/12/2019   Time: 10:40 AM    Patient Name: Sarah Delacruz      Medical Record Number: [id number]  Date of Birth: 10/12/1923 Sex: Female         Room/Bed: 4W12C/4W12C-02 Payor Info: Payor: MEDICARE / Plan: MEDICARE PART A AND B / Product Type: *No Product type* /    Admit Date/Time:  11/05/2019  1:45 PM  Primary Diagnosis:  Acute right MCA stroke [facility])  Patient Active Problem List   Diagnosis Date Noted  . Acute right MCA stroke (HCC) 11/05/2019  . Acute cerebral infarction (HCC)   . Stage 3 chronic kidney disease   . Chronic constipation   . Chronic diastolic congestive heart failure (HCC)   . Essential hypertension   . Dysphagia, post-stroke     Expected Discharge Date: Expected Discharge Date: 11/28/19  Team Members Present: Physician leading conference: Dr. Claudette Laws Care Coodinator Present: Cheyenne Adas, RN, BSN, CRRN;Genie Julianny Milstein, RN, MSN Nurse Present: Ronny Bacon, RN PT Present: Woodfin Ganja, PT OT Present: Perrin Maltese, OT SLP Present: Suzzette Righter, CF-SLP PPS Coordinator present : Fae Pippin, SLP     Current Status/Progress Goal Weekly Team Focus  Bowel/Bladder   incontinent of bowel and bladder; LBM 3/16; q4-6 cath  train bladder/bowel  assess q shift/prn   Swallow/Nutrition/ Hydration   Dys 1/honey, Mod A strategies  Min A  trials ice and thin at bedside after oral care, dys 2 trials, repeat MBSS, carryover swallow strategies   ADL's   Mod assist for UB bathing supported with max assist for UB dressing, total assist for LB bathing and dressing supine to sit EOB.  Total assist +2 (pt 25% for LB selfcare sit to stand.  Left inattention with left hemiparesis is present as well with increased extensor tone in the elbow.  Brunnstrum stage II level in the LUE.  min to mod assist overall  selfcare retraining, transfer training, neuromuscular re-education, balance retraining, DME  education, therapeutic activities, therapeutic exercise.   Mobility   mod-max assist bed mobility, max to +2 for sqaut pivot/lateral scoot transfers, gait 28 ft +2 at the rail  mod asssist  balance, strength, endurance, neuro re-ed, transfers, gait   Communication   Mod-Max A use intelligibility strategies  Min A  use of overarticulation for more precise consonant production   Safety/Cognition/ Behavioral Observations  Min-Mod A recall and sustained attention, Mod A problem solving, intellectual awareness, Mod-Max A left inattention  Min A  visual scanning tasks for left inattention, functional probelm solving, intellectual awareness   Pain   no c/o pain  remain pain free  assess q shift/prn   Skin   MASD buttocks  prevent further breakdown/infection  assess q shift/prn; cleanse/apply cream prn    Rehab Goals Patient on target to meet rehab goals: Yes *See Care Plan and progress notes for long and short-term goals.     Barriers to Discharge  Current Status/Progress Possible Resolutions Date Resolved   Nursing                  PT  Decreased caregiver support;Home environment access/layout;Behavior                 OT                  SLP                SW Decreased caregiver support;Incontinence Grand daughter will  assist daughter providing care [place] services set up and access to incontinence supplies initiated          Discharge Planning/Teaching Needs:  Home with daughter  Transfers, Toileting, Bathing, Medications, etc   Team Discussion: MD BP controlled, urinary retention.  RN caths q8h, small volumes, IV fluids at night, abx for UTI, cont/inc.  OT inc BM yesterday, mod A UB B, max UB D, LB B/D +2 sit to stand, sitting balance min a, total A squat pivot transfer, goals mod A.  PT amb 20' X 4 with Eva walker, transfers max A, mod A goals.  SLP working on swallowing, memory, prob solving.  Repeat swallow end of week.  On D1/honey, mod A prob solving and recall.  Has Dtr and  GDtr.   Revisions to Treatment Plan: N/A     Medical Summary Current Status: low volume caths, IVF at noc due to poor intake, cont of bowels Weekly Focus/Goal: Urinary retention  Barriers to Discharge: IV antibiotics   Possible Resolutions to Barriers: Switch to oral abx, establish bowel cont   Continued Need for Acute Rehabilitation Level of Care: The patient requires daily medical management by a physician with specialized training in physical medicine and rehabilitation for the following reasons: Direction of a multidisciplinary physical rehabilitation program to maximize functional independence : Yes Medical management of patient stability for increased activity during participation in an intensive rehabilitation regime.: Yes Analysis of laboratory values and/or radiology reports with any subsequent need for medication adjustment and/or medical intervention. : Yes   I attest that I was present, lead the team conference, and concur with the assessment and plan of the team.   Jodell Cipro M 11/12/2019, 2:21 PM   Team conference was held via web/ teleconference due to [place] - 19

## 2019-11-12 NOTE — Progress Notes (Signed)
Speech Language Pathology Daily Session Note  Patient Details  Name: Sarah Delacruz MRN: [id number] Date of Birth: 01-Oct-1923  Today's Date: 11/12/2019 SLP Individual Time: 9373-4287 SLP Individual Time Calculation (min): 32 min  Short Term Goals: Week 1: SLP Short Term Goal 1 (Week 1): Pt will consume current diet minimal overt s/sx aspiration and efficient mastication and oral clearance of solids with Max A multimodal cues for use of swallow strategies. SLP Short Term Goal 2 (Week 1): Pt will sustain attention to tasks for 15 minute intervals with Mod A cues for redirection. SLP Short Term Goal 3 (Week 1): Pt will demonstrate ability to recall new and daily information with Max A multimodal cues for compensatory strategies. SLP Short Term Goal 4 (Week 1): Pt will demonstrate ability to problem solve during basic familiar tasks with Max A verbal/visual cues. SLP Short Term Goal 5 (Week 1): Pt will scan left visual field and locate items on left side of environment in 50% opportunities with Max A multimodal cues. SLP Short Term Goal 6 (Week 1): Pt will communicate at the word and phrase level with Max A cues for use of speech intelligibility strategies to achieve ~60% intelligibility.  Skilled Therapeutic Interventions: Pt was seen for skilled ST targeting dysphagia. Pt's daughter was present and engaged throughout session. She was signed off to provide supervision during PO intake. Following thorough oral care, pt accepted trials of 6 ice chips and 10 tsp thin H2O with 1 immediate throat clear response following a tsp of thin. Pt also accepted a small snack size portion of Dys 2 (minced/ground) solids, with prolonged but functional mastication and complete oral clearance with Min A verbal cues for use of strategies. Will trial a full size Dys 2 meal tomorrow to assess readiness for solid advancement and schedule repeat MBSS by the end of the week - continue current diet for now. Pt left sitting in  tilt in space wheelchair with seatbelt alarm in place, needs met to her satisfaction, and daughter present. Continue per current plan of care.          Pain Pain Assessment Pain Scale: 0-10 Pain Score: 0-No pain  Therapy/Group: Individual Therapy  Arbutus Leas 11/12/2019, 7:30 AM

## 2019-11-12 NOTE — Progress Notes (Signed)
[place] PHYSICAL MEDICINE & REHABILITATION PROGRESS NOTE   Subjective/Complaints:  No issues overnite, discussed voiding, cath volumes with nsg,  IV abx interferes with therapy   ROS-   Pt denies SOB, abd pain, CP, N/V//D, and vision changes  Objective:   No results found. Recent Labs    11/10/19 0600  WBC 10.0  HGB 11.8*  HCT 37.5  PLT 390   No results for input(s): NA, K, CL, CO2, GLUCOSE, BUN, CREATININE, CALCIUM in the last 72 hours.  Intake/Output Summary (Last 24 hours) at 11/12/2019 1052 Last data filed at 11/12/2019 0900 Gross per 24 hour  Intake 240 ml  Output 1055 ml  Net -815 ml     Physical Exam: Vital Signs Blood pressure (!) 131/47, pulse 82, temperature 99.2 F (37.3 C), resp. rate 18, height 5' 6" (1.676 m), weight 52.2 kg, SpO2 99 %.   General: No acute distress;sitting up in bed; more appropriate, dysarthric- hard to understand, NAD Psychiatric- more with it/awake- dysarthria noted still Heart: RRR< no MR/G, no JVD Lungs: CTA B/L- no W/R/R- good air movement Abdomen: Soft, NT, ND, (+)BS  Extremities: No clubbing, cyanosis, or edema Skin: No evidence of breakdown, no evidence of rash- feet are warmer today but covered with blankets Neurologic: Ox3; severe dysarthria- still hard ot udnerstand , motor strength is 4/5 in RIght 0/5 left  deltoid, bicep, tricep, grip, 4/5 RIght , 3- Left hip flexor, knee extensors,4/5 RIght and 0/5 left  ankle dorsiflexor and plantar flexor Sensory exam normal sensation to light touch and proprioception in bilateral upper and lower extremities Cerebellar exam normal finger to nose to finger as well as heel to shin in bilateral upper and lower extremities Musculoskeletal: Full range of motion in all 4 extremities. No joint swelling    Assessment/Plan: 1. Functional deficits secondary to Right MCA infarct  which require 3+ hours per day of interdisciplinary therapy in a comprehensive inpatient rehab  setting.  Physiatrist is providing close team supervision and 24 hour management of active medical problems listed below.  Physiatrist and rehab team continue to assess barriers to discharge/monitor patient progress toward functional and medical goals  Care Tool:  Bathing    Body parts bathed by patient: Left arm, Chest, Abdomen, Right upper leg, Left upper leg, Face   Body parts bathed by helper: Right arm, Front perineal area, Buttocks, Right lower leg, Left lower leg     Bathing assist Assist Level: 2 Helpers     Upper Body Dressing/Undressing Upper body dressing   What is the patient wearing?: Pull over shirt    Upper body assist Assist Level: Maximal Assistance - Patient 25 - 49%(Supported sitting in wc)    Lower Body Dressing/Undressing Lower body dressing      What is the patient wearing?: Incontinence brief, Pants     Lower body assist Assist for lower body dressing: 2 Helpers     Toileting Toileting    Toileting assist Assist for toileting: Dependent - Patient 0%     Transfers Chair/bed transfer  Transfers assist     Chair/bed transfer assist level: 2 Helpers     Locomotion Ambulation   Ambulation assist   Ambulation activity did not occur: Safety/medical concerns(poor balance/high fall risk)  Assist level: Moderate Assistance - Patient 50 - 74% Assistive device: Ethelene Hal Max distance: 20 ft   Walk 10 feet activity   Assist  Walk 10 feet activity did not occur: Safety/medical concerns(poor balance/high fall risk)  Assist level: Moderate Assistance -  Patient - 51 - 74% Assistive device: Walker-Eva   Walk 50 feet activity   Assist Walk 50 feet with 2 turns activity did not occur: Safety/medical concerns(poor balance/high fall risk)         Walk 150 feet activity   Assist Walk 150 feet activity did not occur: Safety/medical concerns(poor balance/high fall risk)         Walk 10 feet on uneven surface  activity   Assist  Walk 10 feet on uneven surfaces activity did not occur: Safety/medical concerns(poor balance/high fall risk)         Wheelchair     Assist Will patient use wheelchair at discharge?: Yes Type of Wheelchair: Manual    Wheelchair assist level: Dependent - Patient 0% Max wheelchair distance: 150 ft    Wheelchair 50 feet with 2 turns activity    Assist        Assist Level: Dependent - Patient 0%   Wheelchair 150 feet activity     Assist      Assist Level: Dependent - Patient 0%   Blood pressure (!) 131/47, pulse 82, temperature 99.2 F (37.3 C), resp. rate 18, height 5' 6" (1.676 m), weight 52.2 kg, SpO2 99 %.   Medical Problem List and Plan: 1.  Left hemiparesis with sensory deficits, L-HH with right inattention, pusher tendencies to the left/back, severe dysarthria secondary to right MCA infarct.             -patient may shower             -ELOS/Goals: 18-23 days/min/mod A.             CIR PT, OT . SLP-  Team conference today please see physician documentation under team conference tab, met with team  to discuss problems,progress, and goals. Formulized individual treatment plan based on medical history, underlying problem and comorbidities. 2.  Antithrombotics: -DVT/anticoagulation:  Pharmaceutical: Lovenox             -antiplatelet therapy: ASA daily.  3. Pain Management: Tylenol prn.  4. Mood: LCSW to follow for evaluation and support.              -antipsychotic agents: N/A 5. Neuropsych: This patient is? capable of making decisions on her own behalf. 6. Skin/Wound Care: Routine pressure relief measure.  7. Fluids/Electrolytes/Nutrition: Monitor I/O.  CMP ordered for tomorrow a.m.--may need IVF at nights for hydration.  8. HTN: Monitor BP tid--continue hydralazine and BB (also was on microzide in the past)   Vitals:   11/11/19 2005 11/12/19 0559  BP: (!) 132/51 (!) 131/47  Pulse: 86 82  Resp: 16 18  Temp: 99.5 F (37.5 C) 99.2 F (37.3 C)  SpO2:  100% 99%  Improved with hydralazine to 60m TID  9. Chronic diastolic CHF/Valvular disease: Monitor weights daily with I/O. Continue hydralazine, metoprolol, pravastatin and ASA. No ARB/ACE due to CKD.              Daily weights Filed Weights   11/09/19 0331 11/10/19 0544 11/11/19 0557  Weight: 52.3 kg 52.1 kg 52.2 kg   Stable weights 10. Dyslipidemia: On pravastatin.  11. GERD: resume PPI.  12. Chronic constipation: Continue Senna.  Adjust bowel meds as necessary. 13. CKD III: Monitor renal status with serial checks.  CMP ordered. 14. GU: urinary retention ICP add alpha blocker , sulfa all, use terazosin, reduce cath frq to q 8h 15.  Post stroke dysphagia- given  degree of dysarthria expect severe oral phase issues  as well              Pure with honey thick liquids, advance diet as tolerated  will discuss f/u MBS with SLP this week , oral phase may be main issue  16.  Bowel incont lkely multifactorial with reduced awareness, delay in notifying nurse and difficulty getting g on bedpan  LOS: 7 days A FACE TO [place] [facility] [place] 11/12/2019, 10:52 AM

## 2019-11-12 NOTE — Progress Notes (Signed)
Physical Therapy Session Note  Patient Details  Name: Sarah Delacruz MRN: [id number] Date of Birth: [birth year]  Today's Date: 11/12/2019 PT Individual Time: 1605-1630 PT Individual Time Calculation (min): 25 min   Short Term Goals: Week 1:  PT Short Term Goal 1 (Week 1): Will be able to perform bed mobility with ModA and HOB elevated PT Short Term Goal 2 (Week 1): Will be able to maintain dynamic sittting balance with min guard for 7 minutes PT Short Term Goal 3 (Week 1): Will be able to transfer with ModAx2  Skilled Therapeutic Interventions/Progress Updates:   Pt received supine in bed and agreeable to PT, at bed level  duye to severe fatigue from prior therapies.  Supine NMR. Hip.knee flexion/extension. SAQ, heel slides, hip abduction, clam shells, bridges, each completed 2 x 8 BLE with max cues for decreased speed and improved control of eccentric movement.  Pt left in bed with call bell in reach and all needs met.       Therapy Documentation Precautions:  Precautions Precautions: Fall, Other (comment) Precaution Comments: left inattention, extensor patterns in LEs and trunk Restrictions Weight Bearing Restrictions: No Pain: Pain Assessment Pain Scale: 0-10 Pain Score: 0-No pain Therapy/Group: Individual Therapy  Lorie Phenix 11/12/2019, 4:38 PM

## 2019-11-12 NOTE — Plan of Care (Signed)
  Problem: Consults Goal: RH STROKE PATIENT EDUCATION Description: See Patient Education module for education specifics  Outcome: Progressing   Problem: RH SKIN INTEGRITY Goal: RH STG SKIN FREE OF INFECTION/BREAKDOWN Description: Patients skin will remain free from further infection or breakdown with min assist. Outcome: Progressing Goal: RH STG MAINTAIN SKIN INTEGRITY WITH ASSISTANCE Description: STG Maintain Skin Integrity With min Assistance. Outcome: Progressing Goal: RH STG ABLE TO PERFORM INCISION/WOUND CARE W/ASSISTANCE Description: STG Able To Perform Incision/Wound Care With min Assistance. Outcome: Progressing   Problem: RH SAFETY Goal: RH STG ADHERE TO SAFETY PRECAUTIONS W/ASSISTANCE/DEVICE Description: STG Adhere to Safety Precautions With supervision Assistance/Device. Outcome: Progressing   Problem: RH KNOWLEDGE DEFICIT Goal: RH STG INCREASE KNOWLEDGE OF HYPERTENSION Description: Patient/caregiver will verbalize understanding of management of HTN including diet, exercise, medications, monitoring, and follow up care with min assist. Outcome: Progressing Goal: RH STG INCREASE KNOWLEDGE OF DYSPHAGIA/FLUID INTAKE Description: Patient/caregiver will verbalize understanding of management of dysphagia as guided by SLP. Outcome: Progressing Goal: RH STG INCREASE KNOWLEGDE OF HYPERLIPIDEMIA Description: Patient/caregiver will verbalize understanding of management of HLD including diet, exercise, medications, monitoring, and follow up care with min assist. Outcome: Progressing   Problem: RH BOWEL ELIMINATION Goal: RH STG MANAGE BOWEL WITH ASSISTANCE Description: STG Manage Bowel with min Assistance. Outcome: Not Progressing Goal: RH STG MANAGE BOWEL W/MEDICATION W/ASSISTANCE Description: STG Manage Bowel with Medication with min Assistance. Outcome: Not Progressing   Problem: RH BLADDER ELIMINATION Goal: RH STG MANAGE BLADDER WITH ASSISTANCE Description: STG Manage  Bladder With min Assistance Outcome: Not Progressing

## 2019-11-12 NOTE — Progress Notes (Signed)
Physical Therapy Session Note  Patient Details  Name: Sarah Delacruz MRN: [id number] Date of Birth: [birth year]  Today's Date: 11/12/2019 PT Individual Time: 0802-0915 PT Individual Time Calculation (min): 73 min   Short Term Goals: Week 1:  PT Short Term Goal 1 (Week 1): Will be able to perform bed mobility with ModA and HOB elevated PT Short Term Goal 2 (Week 1): Will be able to maintain dynamic sittting balance with min guard for 7 minutes PT Short Term Goal 3 (Week 1): Will be able to transfer with ModAx2  Skilled Therapeutic Interventions/Progress Updates:    Pt supine in bed upon PT arrival, agreeable to therapy tx and denies pain. Pt transferred to sitting EOB with mod assist for trunk elevation, min-mod assist for scooting EOB. Pt initially with posterior lean but able to shift forward with tactile/verbal cues. Static sitting with CGA. Pt transferred to TIS w/c this session with Max +2 squat pivot with cues to reach for armrest with R UE in order to help with anterior lean. Pt transported to the gym dependently. Pt used standing frame this session working on midline orientation, upright posture, hip/knee extension and LE weightbearing - x7 minutes, within the standing frame pt also able to perform mini squats x 5 for quad/glute activation. Sit<>stands at the rail with mod assist, facilitation for L foot placement/weightbearing and facilitation for hip extension. Pt ambulated 2 x 20 ft this session at the rail for R UE support, mod assist +2 for w/c follow for safety with cues for upright posture, facilitation for hip extension and assist for LE placement. Pt then ambulated x 20 ft and x 15 ft with eva walker this session with mod assist, therapist steering walker and providing cues for upright posture, cues for foot placement and step length as well. Between bouts of walking therapist performed PROM and stretching for tone management to L UE including shoulder ER, shoulder flexion/extension and  elbow extension, for the L LE perform knee flexion/extension and L calf stretch. Pt transported back to room and left in TIS w/c with needs in reach and chair alarm set, RN present.   Therapy Documentation Precautions:  Precautions Precautions: Fall, Other (comment) Precaution Comments: left inattention, extensor patterns in LEs and trunk Restrictions Weight Bearing Restrictions: No    Therapy/Group: Individual Therapy  Cresenciano Genre, PT, DPT ,CSRS 11/12/2019, 7:49 AM

## 2019-11-12 NOTE — Progress Notes (Addendum)
Patient with very low urine output.  Discussed with Dr. Wynn Banker this morning and again this afternoon with Marissa Nestle, PA.  Scanned bladder at 8 hrs no void for .  Recommended wait until 12 hour mark to see if bladder fills to where she has the urge to void on her own.  She is on honey thick liquids and gets nocturnal IVF.  Cathed patient at the 12 hour mark for 300 ml of amber concentrated urine. WILL NEED TO START INTERMITTENT CATH EDUCATION WITH FAMILY.   Dani Gobble, RN

## 2019-11-12 NOTE — Progress Notes (Signed)
Occupational Therapy Weekly Progress Note  Patient Details  Name: Sarah Delacruz MRN: [id number] Date of Birth: August 02, 1924  Beginning of progress report period: November 06, 2019 End of progress report period: November 12, 2019  Today's Date: 11/12/2019 OT Individual Time: 9458-5929 OT Individual Time Calculation (min): 63 min    Patient has met 1 of 4 short term goals.  Sarah Delacruz is making steady but slow progress with OT at this time.  She currently still needs overall min assist for static sitting balance with max assist for dynamic sitting balance.  UB bathing is currently completed at a mod assist level in supported sitting with max assist for UB dressing.  She needs total assist +2 (pt 25%) for LB selfcare sit to stand.  She continues to exhibit increased LE extensor patterns as well as trunk extensor patterns with all transitional movements, scooting or sit to stand.  She continues to demonstrate pusher tendencies to the left as well.  LUE functional movement is at a Brunnstrum stage II level in the arm and hand with increased extensor tone noted in the elbow.  She is able to exhibit some slight trace thumb flexion, but overall needs total assist to integrate as a stabilizer during selfcare tasks.  Ms.  Delacruz continues to demonstrate left visual field deficit, requiring max instructional cueing for larger head turns to the left for locating items on her bedside table or during therapeutic activities in therapy.  Based on her current need for +2 assist, feel she will continue to benefit from CIR level therapy with anticipated discharge on 8/2.  Feel she will still need min to mod assist or greater at discharge and Sarah Delacruz reports that her and the family can provide this assist.   Patient continues to demonstrate the following deficits: muscle weakness, impaired timing and sequencing, abnormal tone, unbalanced muscle activation, motor apraxia, decreased coordination and decreased motor planning,  decreased midline orientation and decreased attention to left, decreased awareness, decreased problem solving, decreased memory and delayed processing and decreased sitting balance, decreased standing balance, decreased postural control, hemiplegia and decreased balance strategies and therefore will continue to benefit from skilled OT intervention to enhance overall performance with BADL and Reduce care partner burden.  Patient progressing toward long term goals..  Continue plan of care.  OT Short Term Goals Week 2:  OT Short Term Goal 1 (Week 2): Patient will complete UB dressing with Mod A in supported sitting with no more than Mod multimodal cues. OT Short Term Goal 2 (Week 2): Patient will demonstrate dynamic sitting balance unsupported with Mod A in prep for UB bathing/dressing tasks. OT Short Term Goal 3 (Week 2): Patient will complete sit > stand transfers with Mod A +2 helpers. OT Short Term Goal 4 (Week 2): Pt will complete UB bathing in supported sitting with min assist.  Skilled Therapeutic Interventions/Progress Updates:    Pt worked on finishing self feeding from the wheelchair to start the session.  She was able to complete with use of the RUE for eating her magic cup as well as drinking her honey thickened juice with supervision.  Next, had her work on donning pants sit to stand.  Max assist for threading pants over her LEs with total +2 (pt 25%) for standing to pull them up over her hips.  Pt with increased posterior lean and decreased forward trunk flexion noted with attempted standing.  Pt with noted bowel incontinence as well, so completed toilet hygiene and changing of brief with  total +2 (pt 25%) sit to stand as well.  Next, therapist took pt down to the ortho gym to work on upright sitting balance from [place], visual scanning, and attention to the left with use of the [employer].  Pt completed 1 minute intervals with max instructional cueing and at sometimes, mod demonstrational cueing  for locating lights left of midline.  Initially she needed an average of 14 seconds between lights but was able to decrease this down to 10 seconds, and eventually down to 6 seconds.  Finished session with transfer back to the room and pt left up in the tilt in space wheelchair.  Educated Sarah Delacruz on tilting chair back every hour for pressure relief.  She was able to return demonstrate this as well.  Safety belt in place at end of session.    Therapy Documentation Precautions:  Precautions Precautions: Fall, Other (comment) Precaution Comments: left inattention, extensor patterns in LEs and trunk Restrictions Weight Bearing Restrictions: No  Pain: Pain Assessment Pain Scale: 0-10 Pain Score: 0-No pain Faces Pain Scale: Hurts a little bit Pain Type: Chronic pain Pain Location: Knee Pain Orientation: Left Pain Descriptors / Indicators: Discomfort Pain Onset: With Activity ADL: See Care Tool Section for some details of mobility and selfcare  Therapy/Group: Individual Therapy  Jerianne Anselmo OTR/L 11/12/2019, 4:23 PM

## 2019-11-13 ENCOUNTER — Inpatient Hospital Stay (HOSPITAL_COMMUNITY): Payer: Medicare Other | Admitting: Physical Therapy

## 2019-11-13 ENCOUNTER — Inpatient Hospital Stay (HOSPITAL_COMMUNITY): Payer: Medicare Other | Admitting: Occupational Therapy

## 2019-11-13 ENCOUNTER — Inpatient Hospital Stay (HOSPITAL_COMMUNITY): Payer: Medicare Other | Admitting: Speech Pathology

## 2019-11-13 LAB — BASIC METABOLIC PANEL
Anion gap: 11 (ref 5–15)
BUN: 19 mg/dL (ref 8–23)
CO2: 24 mmol/L (ref 22–32)
Calcium: 8.7 mg/dL — ABNORMAL LOW (ref 8.9–10.3)
Chloride: 107 mmol/L (ref 98–111)
Creatinine, Ser: 1.21 mg/dL — ABNORMAL HIGH (ref 0.44–1.00)
GFR calc Af Amer: 45 mL/min — ABNORMAL LOW (ref >60)
GFR calc non Af Amer: 39 mL/min — ABNORMAL LOW (ref >60)
Glucose, Bld: 109 mg/dL — ABNORMAL HIGH (ref 70–99)
Potassium: 3.8 mmol/L (ref 3.5–5.1)
Sodium: 142 mmol/L (ref 135–145)

## 2019-11-13 LAB — GLUCOSE, CAPILLARY: Glucose-Capillary: 97 mg/dL (ref 70–99)

## 2019-11-13 MED ORDER — GENERIC EXTERNAL MEDICATION
Status: DC
Start: ? — End: 2019-11-13

## 2019-11-13 NOTE — Progress Notes (Signed)
Occupational Therapy Session Note  Patient Details  Name: Sarah Delacruz MRN: [id number] Date of Birth: 10/20/1923  Today's Date: 11/13/2019 OT Individual Time: 3532-9924 OT Individual Time Calculation (min): 58 min    Short Term Goals: Week 2:  OT Short Term Goal 1 (Week 2): Patient will complete UB dressing with Mod A in supported sitting with no more than Mod multimodal cues. OT Short Term Goal 2 (Week 2): Patient will demonstrate dynamic sitting balance unsupported with Mod A in prep for UB bathing/dressing tasks. OT Short Term Goal 3 (Week 2): Patient will complete sit > stand transfers with Mod A +2 helpers. OT Short Term Goal 4 (Week 2): Pt will complete UB bathing in supported sitting with min assist.  Skilled Therapeutic Interventions/Progress Updates:    Pt worked on bathing and dressing supine to sit EOB.  She was able to roll in the bed with mod assist for cleaning buttocks and donning new brief.  She was able to complete washing her front peri area in supine with setup.  Max assist for washing her buttocks.  Once this was complete she transferred to the EOB with max assist on the right side of the bed.  Had her work on removing her [employer] certified [employer] with max assist and max demonstrational cueing to attend and work on removing the left arm from the sleeve.  She then needed max hand over hand for integration of the LUE to remove the right arm sleeve.  Max assist from therapist for pulling over her head.  She was able to wash her face and some of her upper body, but needed mod assist for thoroughness.  She needed max assist for donning a pullover shirt as well.  Min guard assist for maintaining static sitting balance with max assist for dynamic.  Pt with increased pusher tendencies to the left in sitting with resistance to weightshifts forward or to the right for reaching the washcloth and placing it back in the wash pan.  Total assist for threading her pants over her feet as well as donning  gripper socks.  Total +2 (pt 25%) was needed for standing to pull the pants over her hips as well as for stand pivot transfer to the left to the tilt in space wheelchair.  Increased knee flexion with increased left pushing noted in standing.  Pt unable to achieve full upright midline posture secondary to spatial orientation deficits.  Finished session with pt tilted back in the wheelchair with call button and phone in reach and safety alarm belt in place.  Pt able to verbalize how to get nursing assistance if needed.    Therapy Documentation Precautions:  Precautions Precautions: Fall, Other (comment) Precaution Comments: left inattention, extensor patterns in LEs and trunk Restrictions Weight Bearing Restrictions: No  Pain: Pain Assessment Pain Scale: 0-10 Pain Score: 0-No pain ADL: See Care Tool Section for some details of mobility and selfcare  Therapy/Group: Individual Therapy  Ysidro Ramsay OTR/L 11/13/2019, 9:42 AM

## 2019-11-13 NOTE — Progress Notes (Signed)
Physical Therapy Weekly Progress Note  Patient Details  Name: Sarah Delacruz MRN: [id number] Date of Birth: 1923-12-15  Beginning of progress report period: November 06, 2019 End of progress report period: November 13, 2019  Today's Date: 11/13/2019 PT Individual Time: 1010-1105  And 1420-1458 PT Individual Time Calculation (min): 55 min and 38 min  Patient has met 3 of 3 short term goals.  Ms. Hamm is progressing well with therapy demonstrating improving overall functional mobility to now performing supine<>sit with mod assist, sit<>stand and bed<>chair transfers with max assist (occasional +2 assist), as well as progressed to ambulating up to 137f using EHarmon Pierwalker with mod assist and +2 for safety/w/c follow. She continues to demonstrate significant L LE extensor tone and poor trunk control resulting in poor ability to place feet under her and shift weight anteriorly during transfers resulting in strong left posterolateral lean. She also continues to demonstrate poor motor planning and poor awareness as well as significant L inattention resulting in increased assist to perform safe functional mobility.   Patient continues to demonstrate the following deficits muscle weakness and muscle joint tightness, decreased cardiorespiratoy endurance, impaired timing and sequencing, abnormal tone, unbalanced muscle activation, motor apraxia, decreased coordination and decreased motor planning, decreased midline orientation, decreased attention to left and decreased motor planning, decreased initiation, decreased attention, decreased awareness, decreased problem solving, decreased safety awareness, decreased memory and delayed processing and decreased sitting balance, decreased standing balance, decreased postural control and decreased balance strategies and therefore will continue to benefit from skilled PT intervention to increase functional independence with mobility.  Patient progressing toward long term goals.  Some goals upgraded based on pt progress.  Continue plan of care.  PT Short Term Goals Week 1:  PT Short Term Goal 1 (Week 1): Will be able to perform bed mobility with ModA and HOB elevated PT Short Term Goal 1 - Progress (Week 1): Met PT Short Term Goal 2 (Week 1): Will be able to maintain dynamic sittting balance with min guard for 7 minutes PT Short Term Goal 2 - Progress (Week 1): Met PT Short Term Goal 3 (Week 1): Will be able to transfer with ModAx2 PT Short Term Goal 3 - Progress (Week 1): Met Week 2:  PT Short Term Goal 1 (Week 2): Pt will perform supine<>sit with min assist PT Short Term Goal 2 (Week 2): Pt will perform sit<>stand with mod assist of 1 PT Short Term Goal 3 (Week 2): Pt will perform bed<>chair transfers with mod assist of 1 PT Short Term Goal 4 (Week 2): Pt will ambulate at least 570fusing LRAD with mod assist of 1  Skilled Therapeutic Interventions/Progress Updates:  Ambulation/gait training;DME/adaptive equipment instruction;Psychosocial support;UE/LE Strength taining/ROM;Balance/vestibular training;Functional electrical stimulation;Skin care/wound management;UE/LE Coordination activities;Cognitive remediation/compensation;Functional mobility training;Splinting/orthotics;Visual/perceptual remediation/compensation;Community reintegration;Stair training;Neuromuscular re-education;Wheelchair propulsion/positioning;Discharge planning;Pain management;Therapeutic Activities;Disease management/prevention;Patient/family education;Therapeutic Exercise   Session 1: Pt received sitting in TIS w/c and agreeable to therapy session.  Transported to/from gym in w/c for time management and energy conservation. Gait training 1059fsing EvaHarmon Pierlker with mod assist primarily for eval walker management and balance - able to step L LE forward without assist though continues to demonstrate inability to get L heel on ground during gait; cuing for increased L LE step length, upright trunk  posture, and stepping sequencing. Performed seated L LE quad, hamstring and ankle DF stretches. Gait training ~[id number] Eva Harmon Pierker with continued heavy mod assist for walker management and mod assist for balance - pt demonstrating increasing fatigue resulting in  worsening L LE step length requiring increased cuing although does demonstrate improved L heel contact during midstance. Transitioned to ~83f with B HHA/3Musketeers with +2 mod/max assist for balance with pt demonstrating increased fear of falling resulting in worsening gait mechanics requiring increased cuing for L LE step length. Sit<>stand EOM<>stedy with mirror feedback for improved upright posture due to pt demonstrating significant left and posterior trunk lean caused by quad extensor tone and poor trunk control. Once standing in stedy required mod assist for balance due to continued left and posterior lean requiring max multimodal cuing for anterior and R lateral trunk lean to improve midline and increased trunk/hip extension for improved upright posture. R squat pivot transfer EOM>w/c with mod assist for lifting/pivoting hips and cuing to reach RUE across to opposite w/c armrest to promote increased anterior trunk flexion and weight shift. Transported back to room and NT requesting pt be returned to bed. R squat pivot to bed as described above with cuing to reach out for bedrail to promote anterior weight shift/lean. Sit>supine with mod assist for B LE management up into the bed and max cuing for lateral trunk lean onto forearm. Pt left supine in bed with needs in reach and bed alarm on.  Session 2: Pt received supine in bed, asleep but easily awakens to verbal stimulus. Pt initially not agreeable to therapy session stating "I'm tired and when I'm tired I can't hang out with anyone." but with encouragement pt agreeable to transfer to w/c. Supine>sit R EOB with mod assist for trunk upright and B LE management using bedrail. R squat pivot to w/c  with cuing for reaching R UE to opposite arm rest to promote increased anterior trunk lean and weight shift with mod assist for lifting/pivoting hips and +2 present for safety. RN present to assess vitals and provide medication. Once in w/c pt restates that she is tired and then states she feels like she needs to have a BM. L stand pivot [facility] R UE support on bedrail and max assist for lifting and balance while +2 assist performed total assist LB clothing management. Pt continent of BM and bladder - while standing with max assist performed total assist peri-care and LB clothing management. R stand pivot transfer to w/c with mod/max assist for lifting/pivoting hips and then same transfer to EOB as pt requesting to return to bed and rest. Sit>supine with min assist for B LE management up into the bed. Pt left supine in the bed with needs in reach and bed alarm on.  Therapy Documentation Precautions:  Precautions Precautions: Fall, Other (comment) Precaution Comments: left inattention, extensor patterns in LEs and trunk Restrictions Weight Bearing Restrictions: No  Pain:   Session 1: Denies pain during session.  Session 2: Denies pain during session.  Therapy/Group: Individual Therapy  CTawana Scale PT, DPT 11/13/2019, 7:55 AM

## 2019-11-13 NOTE — Plan of Care (Signed)
  Problem: RH Bed Mobility Goal: LTG Patient will perform bed mobility with assist (PT) Description: LTG: Patient will perform bed mobility with assistance, with/without cues (PT). Flowsheets (Taken 11/13/2019 1810) LTG: Pt will perform bed mobility with assistance level of: (upgraded based on pt progress) Minimal Assistance - Patient > 75% Note: upgraded based on pt progress   Problem: RH Ambulation Goal: LTG Patient will ambulate in controlled environment (PT) Description: LTG: Patient will ambulate in a controlled environment, # of feet with assistance (PT). Flowsheets (Taken 11/13/2019 1810) LTG: Pt will ambulate in controlled environ  assist needed:: (upgraded based on pt progress) Minimal Assistance - Patient > 75% LTG: Ambulation distance in controlled environment: 62ft using LRAD Note: upgraded based on pt progress Goal: LTG Patient will ambulate in home environment (PT) Description: LTG: Patient will ambulate in home environment, # of feet with assistance (PT). Flowsheets (Taken 11/13/2019 1810) LTG: Pt will ambulate in home environ  assist needed:: (upgraded based on pt progress) Minimal Assistance - Patient > 75% LTG: Ambulation distance in home environment: 18ft using LRAD Note: upgraded based on pt progress

## 2019-11-13 NOTE — Progress Notes (Signed)
Team Conference Report to Patient/Family  Team Conference discussion was reviewed with the patient' grand-daughter, including goals for Moderate assist, any changes in plan of care regarding adding care givers/hired help to assist Diane and target discharge date of 11/28/19.  Patient's grand-daughter expressed understanding and is in agreement.  Reviewed PCS resources available per MA and private duty services/agencies that will provide service. List of private duty service agencies given to the family.Reviewed current bladder status and HS IVF and incontinence supplies available per insurance as well. Grand-daughter Anjail expressed understanding of information reviewed.  Chana Bode B 11/13/2019, 4:06 PM

## 2019-11-13 NOTE — Progress Notes (Addendum)
[facility] PHYSICAL MEDICINE & REHABILITATION PROGRESS NOTE   Subjective/Complaints:  Requiring maximal assistance for ADLs, 2 OT's   ROS-   Pt denies SOB, abd pain, CP, N/V//D, and vision changes  Objective:   No results found. No results for input(s): WBC, HGB, HCT, PLT in the last 72 hours. Recent Labs    11/13/19 0611  NA 142  K 3.8  CL 107  CO2 24  GLUCOSE 109*  BUN 19  CREATININE 1.21*  CALCIUM 8.7*    Intake/Output Summary (Last 24 hours) at 11/13/2019 1103 Last data filed at 11/13/2019 0856 Gross per 24 hour  Intake 440 ml  Output 750 ml  Net -310 ml     Physical Exam: Vital Signs Blood pressure (!) 132/45, pulse 85, temperature 98.1 F (36.7 C), temperature source Oral, resp. rate 18, height 5\' 6"  (1.676 m), weight 55.3 kg, SpO2 97 %.     General: No acute distress Mood and affect are appropriate Heart: Regular rate and rhythm no rubs murmurs or extra sounds Lungs: Clear to auscultation, breathing unlabored, no rales or wheezes Abdomen: Positive bowel sounds, soft nontender to palpation, nondistended Extremities: No clubbing, cyanosis, or edema Skin: No evidence of breakdown, no evidence of rash Neurologic:, motor strength  , motor strength is 4/5 in RIght 0/5 left  deltoid, bicep, tricep, grip, 4/5 RIght , 3- Left hip flexor, knee extensors,4/5 RIght and 0/5 left  ankle dorsiflexor and plantar flexor  Musculoskeletal:No joint swelling    Assessment/Plan: 1. Functional deficits secondary to Right MCA infarct  which require 3+ hours per day of interdisciplinary therapy in a comprehensive inpatient rehab setting.  Physiatrist is providing close team supervision and 24 hour management of active medical problems listed below.  Physiatrist and rehab team continue to assess barriers to discharge/monitor patient progress toward functional and medical goals  Care Tool:  Bathing    Body parts bathed by patient: Chest, Abdomen, Left arm, Right upper  leg, Left upper leg, Face, Front perineal area   Body parts bathed by helper: Left lower leg, Buttocks     Bathing assist Assist Level: Maximal Assistance - Patient 24 - 49%(supine to sit)     Upper Body Dressing/Undressing Upper body dressing   What is the patient wearing?: Pull over shirt    Upper body assist Assist Level: Maximal Assistance - Patient 25 - 49%    Lower Body Dressing/Undressing Lower body dressing      What is the patient wearing?: Incontinence brief, Pants     Lower body assist Assist for lower body dressing: 2 Helpers     Toileting Toileting    Toileting assist Assist for toileting: Dependent - Patient 0%     Transfers Chair/bed transfer  Transfers assist     Chair/bed transfer assist level: Dependent - Patient 0%     Locomotion Ambulation   Ambulation assist   Ambulation activity did not occur: Safety/medical concerns(poor balance/high fall risk)  Assist level: Moderate Assistance - Patient 50 - 74% Assistive device: Max distance: 20 ft   Walk 10 feet activity   Assist  Walk 10 feet activity did not occur: Safety/medical concerns(poor balance/high fall risk)  Assist level: Moderate Assistance - Patient - 50 - 74% Assistive device: Walker-Eva   Walk 50 feet activity   Assist Walk 50 feet with 2 turns activity did not occur: Safety/medical concerns(poor balance/high fall risk)         Walk 150 feet activity   Assist Walk 150 feet  activity did not occur: Safety/medical concerns(poor balance/high fall risk)         Walk 10 feet on uneven surface  activity   Assist Walk 10 feet on uneven surfaces activity did not occur: Safety/medical concerns(poor balance/high fall risk)         Wheelchair     Assist Will patient use wheelchair at discharge?: Yes Type of Wheelchair: Manual    Wheelchair assist level: Dependent - Patient 0% Max wheelchair distance: 150 ft    Wheelchair 50 feet with 2 turns  activity    Assist        Assist Level: Dependent - Patient 0%   Wheelchair 150 feet activity     Assist      Assist Level: Dependent - Patient 0%   Blood pressure (!) 132/45, pulse 85, temperature 98.1 F (36.7 C), temperature source Oral, resp. rate 18, height 5\' 6"  (1.676 m), weight 55.3 kg, SpO2 97 %.   Medical Problem List and Plan: 1.  Left hemiparesis with sensory deficits, L-HH with right inattention, pusher tendencies to the left/back, severe dysarthria secondary to right MCA infarct.             -patient may shower             -ELOS/Goals: 4/2             CIR PT, OT . SLP-   2.  Antithrombotics: -DVT/anticoagulation:  Pharmaceutical: Lovenox             -antiplatelet therapy: ASA daily.  3. Pain Management: Tylenol prn.  4. Mood: LCSW to follow for evaluation and support.              -antipsychotic agents: N/A 5. Neuropsych: This patient is? capable of making decisions on her own behalf. 6. Skin/Wound Care: Routine pressure relief measure.  7. Fluids/Electrolytes/Nutrition: Monitor I/O.  IVF at nights for hydration.  8. HTN: Monitor BP tid--continue hydralazine and BB (also was on microzide in the past)   Vitals:   11/12/19 2028 11/13/19 0506  BP: 140/68 (!) 132/45  Pulse: (!) 103 85  Resp: 17 18  Temp: 99.1 F (37.3 C) 98.1 F (36.7 C)  SpO2: 98% 97%  Improved with hydralazine to 25mg  TID  9. Chronic diastolic CHF/Valvular disease: Monitor weights daily with I/O. Continue hydralazine, metoprolol, pravastatin and ASA. No ARB/ACE due to CKD.              Daily weights Filed Weights   11/10/19 0544 11/11/19 0557 11/13/19 0506  Weight: 52.1 kg 52.2 kg 55.3 kg   Stable weights 10. Dyslipidemia: On pravastatin.  11. GERD: resume PPI.  12. Chronic constipation: Continue Senna.  Adjust bowel meds as necessary. 13. CKD III: Monitor renal status with serial checks.  CMP ordered. 14. GU: urinary retention ICP add alpha blocker , sulfa all, use  terazosin, reduce cath frq to q 8h Low-grade temp high risk for UTI will check UA after antibiotic completed if this persists 15.  Post stroke dysphagia- given  degree of dysarthria expect severe oral phase issues as well              Pure with honey thick liquids, advance diet as tolerated  will discuss f/u MBS with SLP this week , oral phase may be main issue  16.  Bowel incont lkely multifactorial with reduced awareness, delay in notifying nurse and difficulty getting g on bedpan  LOS: 8 days A FACE TO FACE EVALUATION WAS PERFORMED  Sarah Delacruz [place] Boyett 11/13/2019, 11:03 AM

## 2019-11-13 NOTE — Progress Notes (Signed)
Speech Language Pathology Daily Session Note  Patient Details  Name: Sarah Delacruz MRN: [id number] Date of Birth: [birth year]  Today's Date: 11/13/2019 SLP Individual Time: 0731-0801 SLP Individual Time Calculation (min): 30 min  Short Term Goals: Week 1: SLP Short Term Goal 1 (Week 1): Pt will consume current diet minimal overt s/sx aspiration and efficient mastication and oral clearance of solids with Max A multimodal cues for use of swallow strategies. SLP Short Term Goal 2 (Week 1): Pt will sustain attention to tasks for 15 minute intervals with Mod A cues for redirection. SLP Short Term Goal 3 (Week 1): Pt will demonstrate ability to recall new and daily information with Max A multimodal cues for compensatory strategies. SLP Short Term Goal 4 (Week 1): Pt will demonstrate ability to problem solve during basic familiar tasks with Max A verbal/visual cues. SLP Short Term Goal 5 (Week 1): Pt will scan left visual field and locate items on left side of environment in 50% opportunities with Max A multimodal cues. SLP Short Term Goal 6 (Week 1): Pt will communicate at the word and phrase level with Max A cues for use of speech intelligibility strategies to achieve ~60% intelligibility.  Skilled Therapeutic Interventions: Pt was seen for skilled ST targeting dysphagia goals. SLP provided skilled observation of pt consuming an upgraded trial of Dysphagia 2 (minced/ground) solid breakfast tray items and honey thick liquids. No overt s/sx aspiration were observed across solids or liquids. Pt's quantity of intake remains limited across all textures, however with Mod faded to Min A verbal cues for use of liquid wash and lingual sweep, pt achieved full oral clearance of Dys 2 and puree solids during breakfast. She did not require suctioning. Although pt demonstrates mastication pattern in which she munches and mashes solids against her gums, this is due to edentulous status rather than current oral dysphagia  deficits. Pt and her daughter have confirmed pt masticates in this manner at baseline and has never used dentures for any regular textures. Pt is improving in her ability to use compensatory strategies to compensate for left buccal pocketing and anterior spillage with fewer interventions from SLP. Recommend to pt upgrade to Dysphagia 2 (minced/ground) solid texture diet, continue honey thick liquids, medications should still be crushed in puree, and full supervision needed to ensure use of compensatory swallow strategies. Repeat MBSS scheduled for tomorrow 11/14/19 to assess oropharyngeal swallow function and potential for liquid advancement. Pt left laying in bed with alarm set and needs within reach. Continue per current plan of care.         Pain Pain Assessment Pain Scale: 0-10 Pain Score: 0-No pain  Therapy/Group: Individual Therapy  Little Ishikawa 11/13/2019, 7:30 AM

## 2019-11-14 ENCOUNTER — Inpatient Hospital Stay (HOSPITAL_COMMUNITY): Payer: Medicare Other | Admitting: Physical Therapy

## 2019-11-14 ENCOUNTER — Encounter (HOSPITAL_COMMUNITY): Payer: Medicare Other | Admitting: Speech Pathology

## 2019-11-14 ENCOUNTER — Inpatient Hospital Stay (HOSPITAL_COMMUNITY): Payer: Medicare Other | Admitting: Occupational Therapy

## 2019-11-14 ENCOUNTER — Inpatient Hospital Stay (HOSPITAL_COMMUNITY): Payer: Medicare Other

## 2019-11-14 NOTE — Progress Notes (Signed)
Speech Language Pathology Weekly Progress and Session Note  Patient Details  Name: Sarah Delacruz MRN: [id number] Date of Birth: [birth year]  Beginning of progress report period: November 13, 2019 End of progress report period: November 14, 2019   Short Term Goals: Week 1: SLP Short Term Goal 1 (Week 1): Pt will consume current diet minimal overt s/sx aspiration and efficient mastication and oral clearance of solids with Max A multimodal cues for use of swallow strategies. SLP Short Term Goal 1 - Progress (Week 1): Met SLP Short Term Goal 2 (Week 1): Pt will sustain attention to tasks for 15 minute intervals with Mod A cues for redirection. SLP Short Term Goal 2 - Progress (Week 1): Met SLP Short Term Goal 3 (Week 1): Pt will demonstrate ability to recall new and daily information with Max A multimodal cues for compensatory strategies. SLP Short Term Goal 3 - Progress (Week 1): Met SLP Short Term Goal 4 (Week 1): Pt will demonstrate ability to problem solve during basic familiar tasks with Max A verbal/visual cues. SLP Short Term Goal 4 - Progress (Week 1): Progressing toward goal SLP Short Term Goal 5 (Week 1): Pt will scan left visual field and locate items on left side of environment in 50% opportunities with Max A multimodal cues. SLP Short Term Goal 5 - Progress (Week 1): Met SLP Short Term Goal 6 (Week 1): Pt will communicate at the word and phrase level with Max A cues for use of speech intelligibility strategies to achieve ~60% intelligibility. SLP Short Term Goal 6 - Progress (Week 1): Met    New Short Term Goals: Week 2: SLP Short Term Goal 1 (Week 2): Pt will consume current diet minimal overt s/sx aspiration and efficient mastication and oral clearance of solids with Min A multimodal cues for use of swallow strategies. SLP Short Term Goal 2 (Week 2): Pt will sustain attention to tasks for 30 minute intervals with Min A cues for redirection. SLP Short Term Goal 3 (Week 2): Pt will  demonstrate ability to recall new and daily information with Mod A multimodal cues for compensatory strategies. SLP Short Term Goal 4 (Week 2): Pt will demonstrate ability to problem solve during basic familiar tasks with Max A verbal/visual cues. SLP Short Term Goal 5 (Week 2): Pt will scan left visual field and locate items on left side of environment in 75% opportunities with Min A multimodal cues. SLP Short Term Goal 6 (Week 2): Pt will communicate at the word and phrase level with Mod A cues for use of speech intelligibility strategies to achieve ~70% intelligibility.  Weekly Progress Updates: Pt has made functional gains and met 5 out of 6 short term goals. Pt is currently Mod assist for basic cognitive tasks due to impairments in sustained attention, basic problem solving, recall and intellectual awareness. She is  Max A for use of speech intelligibility strategies due to moderately severe dysarthria. Pt was upgraded to Dys 2 (minced/ground) diet with thin liquids; repeat MBSS was completed today 11/14/19 with significant improvements in airway protection evidenced. Pt has also demonstrated improved sustained attention, initiation, use of compensatory swallow strategies. Pt and family education is ongoing. Pt would continue to benefit from skilled ST while inpatient in order to maximize functional independence and reduce burden of care prior to discharge. Anticipate that pt will need 24/7 supervision at discharge in addition to [place] follow up at next level of care.      Intensity: Minumum of 1-2 x/day, 30 to  90 minutes Frequency: 3 to 5 out of 7 days Duration/Length of Stay: 11/28/19 Treatment/Interventions: Cueing hierarchy;Cognitive remediation/compensation;Environmental controls;Internal/external aids;Speech/Language facilitation;Functional tasks;Dysphagia/aspiration precaution training;Patient/family education;Therapeutic Activities      Arbutus Leas 11/14/2019, 7:17 AM

## 2019-11-14 NOTE — Progress Notes (Addendum)
Modified Barium Swallow Progress Note  Patient Details  Name: Sarah Delacruz MRN: [id number] Date of Birth: 1923-11-03  Today's Date: 11/14/2019  Modified Barium Swallow completed.  Full report located under Chart Review in the Imaging Section.  Brief recommendations include the following:  Clinical Impression Pt presents with moderate oral and mild pharyngeal dysphagia, although with significantly improved today in comparison to results of last MBSS in chart at [facility] on 11/04/19. Of note, some anatomical calcification was observed on imaging. Oral phase impairments were significant for lingual pumping of thicker textures including honey thick barium and a pill with puree, as well as intermittent very mild lingual/palatal residue. Pt also demonstrated difficulty with posterior propulsion and bolus cohesion when attempting to consume pill with thin and pill with puree. Pill ultimately expectorated. Pt consistently initiated the swallow sequence at the level of the valleculae or pyriform sinuses, however no aspiration events were observed throughout consumption of thin, honey, or puree. Thin barium did penetrate to the level of the vocal folds X1 and trace penetration above the vocal folds X1 during the swallow, however all penetrated material was completely ejected from the laryngeal vestibule during the swallow. Recommend that pt continue Dys 2 (minced/ground) solids and pills crushed in puree, but upgrade to thin liquids. Please provide full supervision to ensure use of swallow strategies and optimal upright positioning during intake. Results were reviewed with pt, RN, and PA. ST will continue to provide skilled interventions to ensure diet safety and efficiency as well as potential for further solid advancement as oral phase impairments improve.       Swallow Evaluation Recommendations       SLP Diet Recommendations: Dysphagia 2 (Fine chop) solids;Thin liquid   Liquid Administration  via: Cup;Straw   Medication Administration: Crushed with puree   Supervision: Patient able to self feed;Full supervision/cueing for compensatory strategies   Compensations: Minimize environmental distractions;Slow rate;Small sips/bites;Monitor for anterior loss;Lingual sweep for clearance of pocketing   Postural Changes: Remain semi-upright after after feeds/meals (Comment);Seated upright at 90 degrees   Oral Care Recommendations: Oral care BID   Other Recommendations: Have oral suction available    Little Ishikawa 11/14/2019,9:36 AM

## 2019-11-14 NOTE — Progress Notes (Signed)
Occupational Therapy Session Note  Patient Details  Name: Sarah Delacruz MRN: [id number] Date of Birth: 1924-04-02  Today's Date: 11/14/2019 OT Individual Time: 2637-8588 OT Individual Time Calculation (min): 48 min    Short Term Goals: Week 2:  OT Short Term Goal 1 (Week 2): Patient will complete UB dressing with Mod A in supported sitting with no more than Mod multimodal cues. OT Short Term Goal 2 (Week 2): Patient will demonstrate dynamic sitting balance unsupported with Mod A in prep for UB bathing/dressing tasks. OT Short Term Goal 3 (Week 2): Patient will complete sit > stand transfers with Mod A +2 helpers. OT Short Term Goal 4 (Week 2): Pt will complete UB bathing in supported sitting with min assist.  Skilled Therapeutic Interventions/Progress Updates:    Pt completed rolling in the bed to start session in order to get cleaned up from bladder incontinence.  Mod assist for rolling to the left with max assist to the right.  She was able to wash her front peri area with setup in supine but needed total assist for washing her buttocks as well as donning new brief.  She then completed transfer from supine to the right side of the bed with max assist.  Once sitting she was able to maintain static balance with min guard assist but demonstrates pushing to the left as well as posterior lean and posterior pelvic tilt.  Increased resistance of forward flexion with therapist facilitation.  Worked on donning pants from the EOB.  She needed max assist for threading over her LEs, but she was able to reach down and pull them up over her knees after they were started. She needed total assist +2 (pt 30%) for sit to stand and standing to pull them up over her hips.  Completed squat pivot transfer to the right to the wheelchair with max assist, but had to return back to the bed secondary to going for testing.  Max assist for stand pivot transfer to the bed to the left to complete the session.   Therapy  Documentation Precautions:  Precautions Precautions: Fall, Other (comment) Precaution Comments: left inattention, extensor patterns in LEs and trunk Restrictions Weight Bearing Restrictions: No  Pain: Pain Assessment Pain Scale: Faces Pain Score: 0-No pain ADL: See Care Tool Section for some details of mobility and selfcare  Therapy/Group: Individual Therapy  Shaeley Segall OTR/L 11/14/2019, 9:13 AM

## 2019-11-14 NOTE — Progress Notes (Signed)
Physical Therapy Session Note  Patient Details  Name: Sarah Delacruz MRN: [id number] Date of Birth: [birth year]  Today's Date: 11/14/2019 PT Individual Time: 1352-1451 PT Individual Time Calculation (min): 59 min   and  Today's Date: 11/14/2019 PT Missed Time: 30 Minutes Missed Time Reason: Patient fatigue  Short Term Goals: Week 2:  PT Short Term Goal 1 (Week 2): Pt will perform supine<>sit with min assist PT Short Term Goal 2 (Week 2): Pt will perform sit<>stand with mod assist of 1 PT Short Term Goal 3 (Week 2): Pt will perform bed<>chair transfers with mod assist of 1 PT Short Term Goal 4 (Week 2): Pt will ambulate at least 8ft using LRAD with mod assist of 1  Skilled Therapeutic Interventions/Progress Updates:    Session 1: Pt received sitting in TIS w/c asleep but easily awakens to verbal stimulus. Pt's granddaughter-in-law, Helmut Muster, in room upon arrival. Pt reporting need to use bathroom. L stand pivot transfer w/c>BSC with max assist for lifting/pivoting and pt continuing to demonstrate posterior lean with feet sliding forward while standing requiring +2 for safety and intermittent min assist as well as +2 providing total assist LB clothing management. Pt continent of bowels requiring mod assist of 1 for standing balance and +2 assist providing total assist LB clothing management and peri-care. R stand pivot back to w/c using R UE support on bedrail and mod assist for pivoting hips and therapist facilitating R/L lateral weight shifting.  Transported to/from gym in w/c for time management and energy conservation. Sit>stand w/c>Eva walker with max assist for lifting into standing as pt continues to demonstrate posterior lean with B LE extensor tone causing feet to slide forward and out on the floor - once in standing with B UE support (assist for LE arm on/off AD) pt able to regain standing balance and bring feet under BOS with mod assist. Gait training 144ft total (1 seated rest break) using  Carley Hammed walker with mod assist for AD management and balance - pt able to step L foot forward without assist and with fatigue demonstrates worsening L LE foot clearance and step length during swing and increasing L knee flexion during stance (though not buckling due to extensor tone). Ascended/descended 4 steps using B HRs with mod/max assist of 1 for balance on ascent and max multimodal cuing to step up with R LE first (+2 present for safety) and max assist for bringing L hand along handrail - +2 max assist on descent due to strong posterior lean/B LE extensor tone and continued max multimodal cuing for sequencing of stepping down with L foot first, on the bottom step pt's non-slip socks slid forward on the floor requiring 3rd person for safety and to assist repositioning w/c so pt could stand pivot transfer safely to sit in w/c. Transported back to room in w/c and pt requesting to return to bed due to fatigue. L squat pivot transfer w/c>EOB using handrail with mod assist for lifting/pivoting hips. Sit>supine with min assist for B LE management into the bed. Pt left supine in bed with needs in reach, bed alarm on, and Helmut Muster present.  Session 2: Pt received supine in bed with her granddaughter-in-law, Helmut Muster, still present reporting she was just about to leave as the pt was requesting to rest. Pt reports she was about to take a nap and is too tired to participate in therapy at this time. Missed 30 minutes of skilled physical therapy.    Therapy Documentation Precautions:  Precautions Precautions: Fall, Other (comment)  Precaution Comments: left inattention, extensor patterns in LEs and trunk Restrictions Weight Bearing Restrictions: No  Pain: Session 1: No complaints of pain during session.  Therapy/Group: Individual Therapy  Tawana Scale, PT, DPT 11/14/2019, 12:57 PM

## 2019-11-14 NOTE — Progress Notes (Signed)
Physical Therapy Session Note  Patient Details  Name: Sarah Delacruz MRN: [id number] Date of Birth: Apr 15, 1924  Today's Date: 11/14/2019 PT Individual Time: 1117-1204 PT Individual Time Calculation (min): 47 min   Short Term Goals: Week 2:  PT Short Term Goal 1 (Week 2): Pt will perform supine<>sit with min assist PT Short Term Goal 2 (Week 2): Pt will perform sit<>stand with mod assist of 1 PT Short Term Goal 3 (Week 2): Pt will perform bed<>chair transfers with mod assist of 1 PT Short Term Goal 4 (Week 2): Pt will ambulate at least 7f using LRAD with mod assist of 1  Skilled Therapeutic Interventions/Progress Updates:    Patient received in bed, sleeping but easily woken and states "I'm tired today!" but able to be encouraged to participate in therapy session. Actually able to get to EOB with HOB moderately elevated with MinAx2 or ModAx1, really needed most assist for balance given posterior lean and to scoot L hip forward to EOB. Had posterior lean/extensor tone and did require MaxAx2 for all functional transfers today with increased posterior lean noted with transfers to the L. Focused most of session on working on anterior lean and L attention in sitting with cross midline reach, as well as standing in stedy for improved activity tolerance and weight bearing/standing balance. Tried using a mGeologist, engineeringfor visual feedback but she perseverated on saying "Hi, hey, hey Miss MMiron" to herself in the mirror repeatedly instead of focusing on task at hand. Used playing cards for attending to L side in standing in stedy, required MaxAx2 for all dynamic activities in standing even in stedy and became very fatigued. Needed fairly constant Max VC for postural corrections in standing today. [facility] [place], all other needs met this morning.   Therapy Documentation Precautions:  Precautions Precautions: Fall, Other  (comment) Precaution Comments: left inattention, extensor patterns in LEs and trunk Restrictions Weight Bearing Restrictions: No   Pain: Pain Assessment Pain Scale: Faces Pain Score: 0-No pain Faces Pain Scale: No hurt    Therapy/Group: Individual Therapy   KWindell Norfolk DPT, PN1   Supplemental Physical Therapist CJack   Pager [phone] Rehab Office [phone]   11/14/2019, 12:29 PM

## 2019-11-15 ENCOUNTER — Inpatient Hospital Stay (HOSPITAL_COMMUNITY): Payer: Medicare Other | Admitting: Physical Therapy

## 2019-11-15 MED ORDER — GENERIC EXTERNAL MEDICATION
Status: DC
Start: ? — End: 2019-11-15

## 2019-11-15 MED ORDER — LORATADINE 10 MG PO TABS
10.0000 mg | ORAL_TABLET | Freq: Every day | ORAL | Status: DC
  Administered 2019-11-15 – 2019-12-05 (×21): 10 mg via ORAL
  Filled 2019-11-15 (×21): qty 1

## 2019-11-15 NOTE — Progress Notes (Signed)
Physical Therapy Session Note  Patient Details  Name: Sarah Delacruz MRN: [id number] Date of Birth: 1924/02/17  Today's Date: 11/15/2019 PT Individual Time: 0938-1829 PT Individual Time Calculation (min): 55 min   Short Term Goals: Week 2:  PT Short Term Goal 1 (Week 2): Pt will perform supine<>sit with min assist PT Short Term Goal 2 (Week 2): Pt will perform sit<>stand with mod assist of 1 PT Short Term Goal 3 (Week 2): Pt will perform bed<>chair transfers with mod assist of 1 PT Short Term Goal 4 (Week 2): Pt will ambulate at least 63ft using LRAD with mod assist of 1  Skilled Therapeutic Interventions/Progress Updates:   Pt received supine in bed with her daughter, Lafonda Mosses, exiting upon therapist's arrival. Pt's daughter has brought in pt's black shoes she usually wears to MD appointments. Pt initially denying therapy stating she is too tired but with encouragement states "I'll try." Reports need to use bathroom. With encouragement, pt agreeable to transfer to [facility] for toileting. Donned B shoes max assist. Supine>sit L EOB with light mod assist for B LE management and trunk upright. R stand pivot to [facility] with mod assist for lifting/balance and +2 providing total assist for LB clothing management - pt continues to demonstrate strong posterior lean with feet sliding forward on ground but improved with shoes and with cuing for increased anterior trunk flexion. Pt continent of bladder - standing with heavy mod assist of 1 while +2 assist performed total assist peri-care. R stand pivot to w/c with +2 mod assist for safety. Transported to/from gym in w/c for time management and energy conservation. Gait training ~38ft x2 using Carley Hammed walker with light mod assist for balance and AD management - pt able to bring L foot forward during swing but intermittently needs cuing for improved foot clearance - continues to demonstrate increased forward trunk flexion (though improving) and increased B LE hip/knee flexion  throughout gait. Dynamic standing balance task using RW of grasping horseshoes off basketball goal and placing to her R to promote improved upright posture and R weight shift as pt demonstrating L posterolateral lean requiring mod assist for balance. Transported back to room and pt requesting to return to bed. L squat/stand pivot to EOB with max assist (+2 for safety). Sit>supine with min/mod assist for trunk descent and B LE management with cuing for sequencing to increase pt independence. Doffed shoes max assist. Pt left supine in bed with needs in reach, HOB elevated, and bed alarm on.  Therapy Documentation Precautions:  Precautions Precautions: Fall, Other (comment) Precaution Comments: left inattention, extensor patterns in LEs and trunk Restrictions Weight Bearing Restrictions: No  Pain:   Denies pain during session.   Therapy/Group: Individual Therapy  Ginny Forth, PT, DPT 11/15/2019, 3:34 PM

## 2019-11-15 NOTE — Plan of Care (Signed)
  Problem: Consults Goal: RH STROKE PATIENT EDUCATION Description: See Patient Education module for education specifics  Outcome: Progressing   Problem: RH BOWEL ELIMINATION Goal: RH STG MANAGE BOWEL WITH ASSISTANCE Description: STG Manage Bowel with min Assistance. Outcome: Progressing Goal: RH STG MANAGE BOWEL W/MEDICATION W/ASSISTANCE Description: STG Manage Bowel with Medication with min Assistance. Outcome: Progressing   Problem: RH BLADDER ELIMINATION Goal: RH STG MANAGE BLADDER WITH ASSISTANCE Description: STG Manage Bladder With min Assistance Outcome: Progressing   Problem: RH SKIN INTEGRITY Goal: RH STG SKIN FREE OF INFECTION/BREAKDOWN Description: Patients skin will remain free from further infection or breakdown with min assist. Outcome: Progressing Goal: RH STG MAINTAIN SKIN INTEGRITY WITH ASSISTANCE Description: STG Maintain Skin Integrity With min Assistance. Outcome: Progressing Goal: RH STG ABLE TO PERFORM INCISION/WOUND CARE W/ASSISTANCE Description: STG Able To Perform Incision/Wound Care With min Assistance. Outcome: Progressing   Problem: RH SAFETY Goal: RH STG ADHERE TO SAFETY PRECAUTIONS W/ASSISTANCE/DEVICE Description: STG Adhere to Safety Precautions With supervision Assistance/Device. Outcome: Progressing   Problem: RH KNOWLEDGE DEFICIT Goal: RH STG INCREASE KNOWLEDGE OF HYPERTENSION Description: Patient/caregiver will verbalize understanding of management of HTN including diet, exercise, medications, monitoring, and follow up care with min assist. Outcome: Progressing Goal: RH STG INCREASE KNOWLEDGE OF DYSPHAGIA/FLUID INTAKE Description: Patient/caregiver will verbalize understanding of management of dysphagia as guided by SLP. Outcome: Progressing Goal: RH STG INCREASE KNOWLEGDE OF HYPERLIPIDEMIA Description: Patient/caregiver will verbalize understanding of management of HLD including diet, exercise, medications, monitoring, and follow up care  with min assist. Outcome: Progressing   

## 2019-11-16 ENCOUNTER — Inpatient Hospital Stay (HOSPITAL_COMMUNITY): Payer: Medicare Other

## 2019-11-16 MED ORDER — GENERIC EXTERNAL MEDICATION
Status: DC
Start: ? — End: 2019-11-16

## 2019-11-16 NOTE — Progress Notes (Signed)
[facility] PHYSICAL MEDICINE & REHABILITATION PROGRESS NOTE   Subjective/Complaints:  Patient sitting in chair.  No complaints today.  She states she had therapy today as well as a good lunch   ROS-   Pt denies SOB, abd pain, CP, N/V//D, and vision changes  Objective:   No results found. No results for input(s): WBC, HGB, HCT, PLT in the last 72 hours. No results for input(s): NA, K, CL, CO2, GLUCOSE, BUN, CREATININE, CALCIUM in the last 72 hours.  Intake/Output Summary (Last 24 hours) at 11/16/2019 1510 Last data filed at 11/16/2019 1300 Gross per 24 hour  Intake 540 ml  Output --  Net 540 ml     Physical Exam: Vital Signs Blood pressure (!) 121/40, pulse 76, temperature 98.5 F (36.9 C), resp. rate 18, height 5\' 6"  (1.676 m), weight 54.4 kg, SpO2 98 %.     General: No acute distress Mood and affect are appropriate Heart: Regular rate and rhythm no rubs murmurs or extra sounds Lungs: Clear to auscultation, breathing unlabored, no rales or wheezes Abdomen: Positive bowel sounds, soft nontender to palpation, nondistended Extremities: No clubbing, cyanosis, or edema Skin: No evidence of breakdown, no evidence of rash Neurologic:  , motor strength is 4/5 in RIght 0/5 left  deltoid, bicep, tricep, grip, 4/5 RIght , 3- Left hip flexor, knee extensors,4/5 RIght and 0/5 left  ankle dorsiflexor and plantar flexor  Musculoskeletal:No joint swelling    Assessment/Plan: 1. Functional deficits secondary to Right MCA infarct  which require 3+ hours per day of interdisciplinary therapy in a comprehensive inpatient rehab setting.  Physiatrist is providing close team supervision and 24 hour management of active medical problems listed below.  Physiatrist and rehab team continue to assess barriers to discharge/monitor patient progress toward functional and medical goals  Care Tool:  Bathing    Body parts bathed by patient: Face, Front perineal area   Body parts bathed by  helper: Buttocks Body parts n/a: Right arm, Left arm, Chest, Abdomen, Buttocks, Right upper leg, Left upper leg, Left lower leg, Right lower leg   Bathing assist Assist Level: Maximal Assistance - Patient 24 - 49%(bed level)     Upper Body Dressing/Undressing Upper body dressing   What is the patient wearing?: Pull over shirt    Upper body assist Assist Level: Maximal Assistance - Patient 25 - 49%    Lower Body Dressing/Undressing Lower body dressing      What is the patient wearing?: Incontinence brief, Pants     Lower body assist Assist for lower body dressing: 2 Helpers     Toileting Toileting    Toileting assist Assist for toileting: Dependent - Patient 0%     Transfers Chair/bed transfer  Transfers assist     Chair/bed transfer assist level: Moderate Assistance - Patient 50 - 74%     Locomotion Ambulation   Ambulation assist   Ambulation activity did not occur: Safety/medical concerns(poor balance/high fall risk)  Assist level: 2 helpers(mod A and +2 w/c follow) Assistive device: Ethelene Hal Max distance: 40ft   Walk 10 feet activity   Assist  Walk 10 feet activity did not occur: Safety/medical concerns(poor balance/high fall risk)  Assist level: 2 helpers(mod A and +2 w/c follow) Assistive device: Walker-Eva   Walk 50 feet activity   Assist Walk 50 feet with 2 turns activity did not occur: Safety/medical concerns(poor balance/high fall risk)  Assist level: Moderate Assistance - Patient - 50 - 74%(mod A and +2 w/c follow) Assistive device: Ethelene Hal  Walk 150 feet activity   Assist Walk 150 feet activity did not occur: Safety/medical concerns(poor balance/high fall risk)         Walk 10 feet on uneven surface  activity   Assist Walk 10 feet on uneven surfaces activity did not occur: Safety/medical concerns(poor balance/high fall risk)         Wheelchair     Assist Will patient use wheelchair at discharge?: Yes Type of  Wheelchair: Manual    Wheelchair assist level: Dependent - Patient 0% Max wheelchair distance: 150 ft    Wheelchair 50 feet with 2 turns activity    Assist        Assist Level: Dependent - Patient 0%   Wheelchair 150 feet activity     Assist      Assist Level: Dependent - Patient 0%   Blood pressure (!) 121/40, pulse 76, temperature 98.5 F (36.9 C), resp. rate 18, height 5\' 6"  (1.676 m), weight 54.4 kg, SpO2 98 %.   Medical Problem List and Plan: 1.  Left hemiparesis with sensory deficits, L-HH with right inattention, pusher tendencies to the left/back, severe dysarthria secondary to right MCA infarct.             -patient may shower             -ELOS/Goals: 4/2             CIR PT, OT . SLP-   2.  Antithrombotics: -DVT/anticoagulation:  Pharmaceutical: Lovenox             -antiplatelet therapy: ASA daily.  3. Pain Management: Tylenol prn.  4. Mood: LCSW to follow for evaluation and support.              -antipsychotic agents: N/A 5. Neuropsych: This patient is? capable of making decisions on her own behalf. 6. Skin/Wound Care: Routine pressure relief measure.  7. Fluids/Electrolytes/Nutrition: Monitor I/O.  IVF at nights for hydration.  8. HTN: Monitor BP tid--continue hydralazine and BB (also was on microzide in the past)   Vitals:   11/16/19 0414 11/16/19 1310  BP: (!) 125/53 (!) 121/40  Pulse: 71 76  Resp: 18 18  Temp: 98.3 F (36.8 C) 98.5 F (36.9 C)  SpO2: 98% 98%  Improved with hydralazine to 25mg  TID, monitor for hypotension 9. Chronic diastolic CHF/Valvular disease: Monitor weights daily with I/O. Continue hydralazine, metoprolol, pravastatin and ASA. No ARB/ACE due to CKD.              Daily weights Filed Weights   11/13/19 0506 11/15/19 0546 11/16/19 0414  Weight: 55.3 kg 56.2 kg 54.4 kg   Stable weights 10. Dyslipidemia: On pravastatin.  11. GERD: resume PPI.  12. Chronic constipation: Continue Senna.  Adjust bowel meds as  necessary. 13. CKD III: Monitor renal status with serial checks.  CMP ordered. 14. GU: urinary retention ICP add alpha blocker , sulfa all, use terazosin, reduce cath frq to q 8h Low-grade temp high risk for UTI will check UA after antibiotic completed if this persists 15.  Post stroke dysphagia- given  degree of dysarthria expect severe oral phase issues as well              Pure with honey thick liquids, advance diet as tolerated  will discuss f/u MBS with SLP this week , oral phase may be main issue  16.  Bowel incont lkely multifactorial with reduced awareness, delay in notifying nurse and difficulty getting g on bedpan  LOS:  11 days A FACE TO FACE EVALUATION WAS PERFORMED  Erick Colace 11/16/2019, 3:10 PM

## 2019-11-16 NOTE — Progress Notes (Signed)
Physical Therapy Session Note  Patient Details  Name: Sarah Delacruz MRN: [id number] Date of Birth: 05/07/1924  Today's Date: 11/16/2019 PT Individual Time: 1117-1200 PT Individual Time Calculation (min): 43 min    Short Term Goals: Week 2:  PT Short Term Goal 1 (Week 2): Pt will perform supine<>sit with min assist PT Short Term Goal 2 (Week 2): Pt will perform sit<>stand with mod assist of 1 PT Short Term Goal 3 (Week 2): Pt will perform bed<>chair transfers with mod assist of 1 PT Short Term Goal 4 (Week 2): Pt will ambulate at least 42ft using LRAD with mod assist of 1  Skilled Therapeutic Interventions/Progress Updates:    Pt supine in bed upon PT arrival, agreeable to therapy tx and denies pain. Pt reports she has to use the bathroom, agreeable to try using the commode. Pt transferred to sitting EOB with mod assist, uponsitting cues to correct posterior lean. Pt performed stand pivot to the commode with mod assist, second helper to assist with clothing management. Pt continent of bladder and performed pericare with set up assist while sitting on commode. Pt performed sit>stand mod assist with UE support reaching forward for w/c armrest, second perform donned brief total assist. Stand pivot to TIS with mod assist +2, pt continues with strong posterior lean initially with sit<>stand. Pt transported to the gym. Pt performed blocked practice of stand pivot transfers this session with RW in both directions from TIS w/c<>mat x 3, max cues for foot placement, anterior weightshift and steering of RW to turn and sit, overall min-mod assist physically secondary to balance deficits and posterior lean. Between transfers, worked on sitting balance edge of mat including anterior weightshifting and bringing feet underneath her for support/weightbearing. Pt left in TIS w/c at end of session, needs in reach and chair alarm set.   Therapy Documentation Precautions:  Precautions Precautions: Fall, Other  (comment) Precaution Comments: left inattention, extensor patterns in LEs and trunk Restrictions Weight Bearing Restrictions: No    Therapy/Group: Individual Therapy  Cresenciano Genre, PT, DPT, CSRS 11/16/2019, 7:47 AM

## 2019-11-16 NOTE — Plan of Care (Signed)
  Problem: Consults Goal: RH STROKE PATIENT EDUCATION Description: See Patient Education module for education specifics  Outcome: Progressing   Problem: RH BOWEL ELIMINATION Goal: RH STG MANAGE BOWEL WITH ASSISTANCE Description: STG Manage Bowel with min Assistance. Outcome: Progressing Goal: RH STG MANAGE BOWEL W/MEDICATION W/ASSISTANCE Description: STG Manage Bowel with Medication with min Assistance. Outcome: Progressing   Problem: RH BLADDER ELIMINATION Goal: RH STG MANAGE BLADDER WITH ASSISTANCE Description: STG Manage Bladder With min Assistance Outcome: Progressing   Problem: RH SKIN INTEGRITY Goal: RH STG SKIN FREE OF INFECTION/BREAKDOWN Description: Patients skin will remain free from further infection or breakdown with min assist. Outcome: Progressing Goal: RH STG MAINTAIN SKIN INTEGRITY WITH ASSISTANCE Description: STG Maintain Skin Integrity With min Assistance. Outcome: Progressing Goal: RH STG ABLE TO PERFORM INCISION/WOUND CARE W/ASSISTANCE Description: STG Able To Perform Incision/Wound Care With min Assistance. Outcome: Progressing   Problem: RH SAFETY Goal: RH STG ADHERE TO SAFETY PRECAUTIONS W/ASSISTANCE/DEVICE Description: STG Adhere to Safety Precautions With supervision Assistance/Device. Outcome: Progressing   Problem: RH KNOWLEDGE DEFICIT Goal: RH STG INCREASE KNOWLEDGE OF HYPERTENSION Description: Patient/caregiver will verbalize understanding of management of HTN including diet, exercise, medications, monitoring, and follow up care with min assist. Outcome: Progressing Goal: RH STG INCREASE KNOWLEDGE OF DYSPHAGIA/FLUID INTAKE Description: Patient/caregiver will verbalize understanding of management of dysphagia as guided by SLP. Outcome: Progressing Goal: RH STG INCREASE KNOWLEGDE OF HYPERLIPIDEMIA Description: Patient/caregiver will verbalize understanding of management of HLD including diet, exercise, medications, monitoring, and follow up care  with min assist. Outcome: Progressing   

## 2019-11-17 ENCOUNTER — Inpatient Hospital Stay (HOSPITAL_COMMUNITY): Payer: Medicare Other

## 2019-11-17 ENCOUNTER — Inpatient Hospital Stay (HOSPITAL_COMMUNITY): Payer: Medicare Other | Admitting: Occupational Therapy

## 2019-11-17 ENCOUNTER — Inpatient Hospital Stay (HOSPITAL_COMMUNITY): Payer: Medicare Other | Admitting: Speech Pathology

## 2019-11-17 LAB — CBC
HCT: 29.8 % — ABNORMAL LOW (ref 36.0–46.0)
Hemoglobin: 9.4 g/dL — ABNORMAL LOW (ref 12.0–15.0)
MCH: 29.2 pg (ref 26.0–34.0)
MCHC: 31.5 g/dL (ref 30.0–36.0)
MCV: 92.5 fL (ref 80.0–100.0)
Platelets: 323 10*3/uL (ref 150–400)
RBC: 3.22 MIL/uL — ABNORMAL LOW (ref 3.87–5.11)
RDW: 13.7 % (ref 11.5–15.5)
WBC: 6.1 10*3/uL (ref 4.0–10.5)
nRBC: 0 % (ref 0.0–0.2)

## 2019-11-17 NOTE — Progress Notes (Signed)
Physical Therapy Session Note  Patient Details  Name: Sarah Delacruz MRN: [id number] Date of Birth: February 17, 1924  Today's Date: 11/17/2019 PT Individual Time: 1101-1146 PT Individual Time Calculation (min): 45 min   and Today's Date: 11/17/2019 PT Missed Time: 15 Minutes Missed Time Reason: Patient fatigue  Short Term Goals: Week 2:  PT Short Term Goal 1 (Week 2): Pt will perform supine<>sit with min assist PT Short Term Goal 2 (Week 2): Pt will perform sit<>stand with mod assist of 1 PT Short Term Goal 3 (Week 2): Pt will perform bed<>chair transfers with mod assist of 1 PT Short Term Goal 4 (Week 2): Pt will ambulate at least 90ft using LRAD with mod assist of 1  Skilled Therapeutic Interventions/Progress Updates:    Patient seated in TIS w/c upon PT arrival, agreeable to therapy tx, denies pain. Pt reported having bowel movements throughout the night and during the early morning, not sleeping well and feeling significantly fatigued. Therapist provided rest breaks throughout session. Pt transported in TIS to rehab gym for time management and energy conservation. Stand pivot transfer from TIS > mat with RW with min-modA +2 for safety and RW management, max cueing for sequencing. Once seated on mat, pt reported feeling like she was going to have a bowel movement. Stand pivot transfer back to TIS with same assist level. Pt transported back to room in TIS. Stand pivot transfer with RW from TIS > BSC with minA-modA. Standing in front of BSC, therapist performed clothing management totalA. Stand > sit with modA for positioning on BSC and cues for sequencing. Pt continent of bladder and bowels, therapist providing SBA. Pt began coughing and appeared like she was going to get sick however just had to be suctioned but reported nausea. Sit > stand with RW minA + 2 for second helper to perform pericare and clothing management totalA. Stand > sit on BSC with lid down for rest break, min-modA and max cueing for  sequencing. Stand pivot transfer from [facility] > bed with modA + 2 for RW management and cues for sequencing and technique. Stand > sitting EOB with minA > supine with modA for LE management and trunk positioning d/t significant fatigue. Pt left supine in bed with HOB elevated, needs in reach and bed alarm set. Patient missed 15 minutes of skilled physical therapy intervention d/t significant fatgiue.   Therapy Documentation Precautions:  Precautions Precautions: Fall, Other (comment) Precaution Comments: left inattention, extensor patterns in LEs and trunk Restrictions Weight Bearing Restrictions: No    Therapy/Group: Individual Therapy  Blima Ledger SPT 11/17/2019, 7:50 AM

## 2019-11-17 NOTE — Progress Notes (Signed)
[place] PHYSICAL MEDICINE & REHABILITATION PROGRESS NOTE   Subjective/Complaints: Patient sitting in chair.  No complaints today. She is alert.  Hgb decreased to 9.4 from 11.8 one week ago.   ROS-  Pt denines SOB, abd pain, CP, N/V//D, and vision changes  Objective: No results found. Recent Labs    11/17/19 0621  WBC 6.1  HGB 9.4*  HCT 29.8*  PLT 323   No results for input(s): NA, K, CL, CO2, GLUCOSE, BUN, CREATININE, CALCIUM in the last 72 hours.  Intake/Output Summary (Last 24 hours) at 11/17/2019 1241 Last data filed at 11/17/2019 0730 Gross per 24 hour  Intake 580 ml  Output --  Net 580 ml     Physical Exam: Vital Signs Blood pressure (!) 135/51, pulse 70, temperature 98.4 F (36.9 C), temperature source Oral, resp. rate 18, height 5\' 6"  (1.676 m), weight 54.2 kg, SpO2 99 %. General: No acute distress, sitting up comfortably in bed.  Mood and affect are appropriate Heart: Regular rate and rhythm no rubs murmurs or extra sounds Lungs: Clear to auscultation, breathing unlabored, no rales or wheezes Abdomen: Positive bowel sounds, soft nontender to palpation, nondistended Extremities: No clubbing, cyanosis, or edema Skin: No evidence of breakdown, no evidence of rash Neurologic: Motor strength is 4/5 in RIght 0/5 left  deltoid, bicep, tricep, grip, 4/5 RIght , 3- Left hip flexor, knee extensors,4/5 RIght and 0/5 left  ankle dorsiflexor and plantar flexor Musculoskeletal:No joint swelling  Assessment/Plan: 1. Functional deficits secondary to Right MCA infarct  which require 3+ hours per day of interdisciplinary therapy in a comprehensive inpatient rehab setting.  Physiatrist is providing close team supervision and 24 hour management of active medical problems listed below.  Physiatrist and rehab team continue to assess barriers to discharge/monitor patient progress toward functional and medical goals  Care Tool:  Bathing    Body parts bathed by patient: Left  arm, Chest, Abdomen, Right upper leg, Left upper leg, Face   Body parts bathed by helper: Right lower leg, Left lower leg, Front perineal area, Buttocks, Right arm Body parts n/a: Right arm, Left arm, Chest, Abdomen, Buttocks, Right upper leg, Left upper leg, Left lower leg, Right lower leg   Bathing assist Assist Level: Maximal Assistance - Patient 24 - 49%     Upper Body Dressing/Undressing Upper body dressing   What is the patient wearing?: Pull over shirt    Upper body assist Assist Level: Maximal Assistance - Patient 25 - 49%    Lower Body Dressing/Undressing Lower body dressing      What is the patient wearing?: Incontinence brief, Pants     Lower body assist Assist for lower body dressing: 2 Helpers     Toileting Toileting    Toileting assist Assist for toileting: Dependent - Patient 0%     Transfers Chair/bed transfer  Transfers assist     Chair/bed transfer assist level: Maximal Assistance - Patient 25 - 49%(stand pivot)     Locomotion Ambulation   Ambulation assist   Ambulation activity did not occur: Safety/medical concerns(poor balance/high fall risk)  Assist level: 2 helpers(mod A and +2 w/c follow) Assistive device: Walker-Eva Max distance: 29ft   Walk 10 feet activity   Assist  Walk 10 feet activity did not occur: Safety/medical concerns(poor balance/high fall risk)  Assist level: 2 helpers(mod A and +2 w/c follow) Assistive device: Walker-Eva   Walk 50 feet activity   Assist Walk 50 feet with 2 turns activity did not occur: Safety/medical concerns(poor  balance/high fall risk)  Assist level: Moderate Assistance - Patient - 50 - 74%(mod A and +2 w/c follow) Assistive device: Walker-Eva    Walk 150 feet activity   Assist Walk 150 feet activity did not occur: Safety/medical concerns(poor balance/high fall risk)         Walk 10 feet on uneven surface  activity   Assist Walk 10 feet on uneven surfaces activity did not occur:  Safety/medical concerns(poor balance/high fall risk)         Wheelchair     Assist Will patient use wheelchair at discharge?: Yes Type of Wheelchair: Manual    Wheelchair assist level: Dependent - Patient 0% Max wheelchair distance: 150 ft    Wheelchair 50 feet with 2 turns activity    Assist        Assist Level: Dependent - Patient 0%   Wheelchair 150 feet activity     Assist      Assist Level: Dependent - Patient 0%   Blood pressure (!) 135/51, pulse 70, temperature 98.4 F (36.9 C), temperature source Oral, resp. rate 18, height 5\' 6"  (1.676 m), weight 54.2 kg, SpO2 99 %.   Medical Problem List and Plan: 1.  Left hemiparesis with sensory deficits, L-HH with right inattention, pusher tendencies to the left/back, severe dysarthria secondary to right MCA infarct.             -patient may shower             -ELOS/Goals: 4/2             Continue CIR PT, OT . SLP-  2.  Antithrombotics: -DVT/anticoagulation:  Pharmaceutical: Lovenox             -antiplatelet therapy: ASA daily.  3. Pain Management: Tylenol prn. Well controlled.  4. Mood: LCSW to follow for evaluation and support.              -antipsychotic agents: N/A 5. Neuropsych: This patient is? capable of making decisions on her own behalf. 6. Skin/Wound Care: Routine pressure relief measure.  7. Fluids/Electrolytes/Nutrition: Monitor I/O.  IVF at nights for hydration.  8. HTN: Monitor BP tid--continue hydralazine and BB (also was on microzide in the past)  Vitals:   11/16/19 2156 11/17/19 0259  BP: (!) 160/55 (!) 135/51  Pulse: 72 70  Resp: 20 18  Temp: 98.2 F (36.8 C) 98.4 F (36.9 C)  SpO2: 98% 99%  Improved with hydralazine to 25mg  TID, monitor for hypotension 9. Chronic diastolic CHF/Valvular disease: Monitor weights daily with I/O. Continue hydralazine, metoprolol, pravastatin and ASA. No ARB/ACE due to CKD.              Daily weights Filed Weights   11/15/19 0546 11/16/19 0414  11/17/19 0259  Weight: 56.2 kg 54.4 kg 54.2 kg  Stable weights 10. Dyslipidemia: On pravastatin.  11. GERD: resume PPI.  12. Chronic constipation: Continue Senna.  Adjust bowel meds as necessary. 13. CKD III: Monitor renal status with serial checks.  CMP ordered. 14. GU: urinary retention ICP add alpha blocker , sulfa all, use terazosin, reduce cath frq to q 8h Low-grade temp high risk for UTI will check UA after antibiotic completed if this persists 15.  Post stroke dysphagia- given  degree of dysarthria expect severe oral phase issues as well              Pure with honey thick liquids, advance diet as tolerated   Will discuss f/u MBS with SLP this week ,  oral phase may be main issue  16.  Bowel incont lkely multifactorial with reduced awareness, delay in notifying nurse and difficulty getting g on bedpan  17. Anemia: Hgb decreased to 9.4 from 11.8 one week ago. Had been stable in 11s prior to this. Ordered heme occult.  18. Disposition: Daughter present for end of OT session today.  LOS: 12 days A FACE TO FACE EVALUATION WAS PERFORMED  Sarah Delacruz 11/17/2019, 12:41 PM

## 2019-11-17 NOTE — Progress Notes (Signed)
Speech Language Pathology Daily Session Note  Patient Details  Name: Sarah Delacruz MRN: [id number] Date of Birth: [birth year]  Today's Date: 11/17/2019 SLP Individual Time: 0347-4259 SLP Individual Time Calculation (min): 43 min  Short Term Goals: Week 2: SLP Short Term Goal 1 (Week 2): Pt will consume current diet minimal overt s/sx aspiration and efficient mastication and oral clearance of solids with Min A multimodal cues for use of swallow strategies. SLP Short Term Goal 2 (Week 2): Pt will sustain attention to tasks for 30 minute intervals with Min A cues for redirection. SLP Short Term Goal 3 (Week 2): Pt will demonstrate ability to recall new and daily information with Mod A multimodal cues for compensatory strategies. SLP Short Term Goal 4 (Week 2): Pt will demonstrate ability to problem solve during basic familiar tasks with Max A verbal/visual cues. SLP Short Term Goal 5 (Week 2): Pt will scan left visual field and locate items on left side of environment in 75% opportunities with Min A multimodal cues. SLP Short Term Goal 6 (Week 2): Pt will communicate at the word and phrase level with Mod A cues for use of speech intelligibility strategies to achieve ~70% intelligibility.  Skilled Therapeutic Interventions: Pt was seen for skilled ST targeting dysphagia and cognitive goals. Pt's daughter also present during session. Education regarding optimal positioning for safe PO intake provided to daughter and pt. Pt accepted sips of thin via straw without overt s/sx aspiration and Mod A verbal cues for use of slow rate. Recommend continue current diet. SLP further facilitated session with a basic grocery list and ad search task. Pt required Total A to create list - writing illegible and unable to follow cues to write horizontally across paper rather than vertically. Max A verbal and visual cues required for functional use of list and recall when using it to search for items within grocery ad. Mod A  verbal and visual cues also provided or use of strategies such as visual anchors and tracing with her finger for visual scanning on the ad and when reading her list. Pt left laying in bed with alarm set and needs within reach, daughter still present. Continue per current plan of care.      Pain Pain Assessment Pain Scale: Faces Pain Score: 0-No pain  Therapy/Group: Individual Therapy  Sarah Delacruz 11/17/2019, 12:56 PM

## 2019-11-17 NOTE — Progress Notes (Signed)
Occupational Therapy Session Note  Patient Details  Name: Sarah Delacruz MRN: [id number] Date of Birth: 1924-05-10  Today's Date: 11/17/2019 OT Individual Time: 3734-2876 OT Individual Time Calculation (min): 69 min    Short Term Goals: Week 2:  OT Short Term Goal 1 (Week 2): Patient will complete UB dressing with Mod A in supported sitting with no more than Mod multimodal cues. OT Short Term Goal 2 (Week 2): Patient will demonstrate dynamic sitting balance unsupported with Mod A in prep for UB bathing/dressing tasks. OT Short Term Goal 3 (Week 2): Patient will complete sit > stand transfers with Mod A +2 helpers. OT Short Term Goal 4 (Week 2): Pt will complete UB bathing in supported sitting with min assist.  Skilled Therapeutic Interventions/Progress Updates:    Pt started session with completion of toileting at bed level.  Pt incontinent of bowel after first clean up by nursing.  Total assist needed to clean peri area and buttocks in supine rolling.  Had pt complete donning brief in supine rolling with total assist overall before transitioning to sitting with mod assist on the left side of the bed.  Once up, she was able to maintain sitting balance EOB with close supervision.  Pt with better midline orientation this session but still with posterior pelvic tilt and lean.  She washed her face, chest, abdomen, and left arm with mod instructional cueing and setup.  Encouraged trunk flexion for grasping and wringing out the washcloth with the RUE.  Noted increased extensor tone in the left elbow and digits with hand over hand use to wash the right arm.  She needed total assist for washing her feet but was able to wash her upper legs.  She needed max assist for UB dressing and for donning pants over her LEs.  Total +2 (pt 30%) for standing to pull items over her hips.  Therapist provided total assist for donning socks as well as zip up shoes.  She was able to complete stand pivot transfer to the right  to complete session.  Pt left up in the wheelchair with call button and phone in reach and safety belt in place.  Pt's daughter in at end of session as well.   Therapy Documentation Precautions:  Precautions Precautions: Fall, Other (comment) Precaution Comments: left inattention, extensor patterns in LEs and trunk Restrictions Weight Bearing Restrictions: No  Pain: Pain Assessment Pain Scale: Faces Pain Score: 0-No pain ADL: See Care Tool Section for some details of mobility and selfcare  Therapy/Group: Individual Therapy  Noya Santarelli OTR/L 11/17/2019, 12:15 PM

## 2019-11-18 ENCOUNTER — Inpatient Hospital Stay (HOSPITAL_COMMUNITY): Payer: Medicare Other | Admitting: Speech Pathology

## 2019-11-18 ENCOUNTER — Inpatient Hospital Stay (HOSPITAL_COMMUNITY): Payer: Medicare Other | Admitting: Occupational Therapy

## 2019-11-18 ENCOUNTER — Inpatient Hospital Stay (HOSPITAL_COMMUNITY): Payer: Medicare Other

## 2019-11-18 LAB — CBC
HCT: 30.6 % — ABNORMAL LOW (ref 36.0–46.0)
Hemoglobin: 9.7 g/dL — ABNORMAL LOW (ref 12.0–15.0)
MCH: 29.3 pg (ref 26.0–34.0)
MCHC: 31.7 g/dL (ref 30.0–36.0)
MCV: 92.4 fL (ref 80.0–100.0)
Platelets: 335 10*3/uL (ref 150–400)
RBC: 3.31 MIL/uL — ABNORMAL LOW (ref 3.87–5.11)
RDW: 14 % (ref 11.5–15.5)
WBC: 6 10*3/uL (ref 4.0–10.5)
nRBC: 0 % (ref 0.0–0.2)

## 2019-11-18 LAB — BASIC METABOLIC PANEL
Anion gap: 12 (ref 5–15)
BUN: 21 mg/dL (ref 8–23)
CO2: 22 mmol/L (ref 22–32)
Calcium: 8.7 mg/dL — ABNORMAL LOW (ref 8.9–10.3)
Chloride: 103 mmol/L (ref 98–111)
Creatinine, Ser: 1.51 mg/dL — ABNORMAL HIGH (ref 0.44–1.00)
GFR calc Af Amer: 34 mL/min — ABNORMAL LOW (ref >60)
GFR calc non Af Amer: 29 mL/min — ABNORMAL LOW (ref >60)
Glucose, Bld: 121 mg/dL — ABNORMAL HIGH (ref 70–99)
Potassium: 3.8 mmol/L (ref 3.5–5.1)
Sodium: 137 mmol/L (ref 135–145)

## 2019-11-18 MED ORDER — HYDRALAZINE HCL 10 MG PO TABS
10.0000 mg | ORAL_TABLET | Freq: Three times a day (TID) | ORAL | Status: DC
  Administered 2019-11-18 – 2019-11-21 (×9): 10 mg via ORAL
  Filled 2019-11-18 (×9): qty 1

## 2019-11-18 MED ORDER — CALCIUM CARBONATE-VITAMIN D 500-200 MG-UNIT PO TABS
1.0000 | ORAL_TABLET | Freq: Two times a day (BID) | ORAL | Status: DC
  Administered 2019-11-18 – 2019-12-05 (×34): 1 via ORAL
  Filled 2019-11-18 (×34): qty 1

## 2019-11-18 MED ORDER — METOPROLOL TARTRATE 25 MG PO TABS
25.0000 mg | ORAL_TABLET | Freq: Two times a day (BID) | ORAL | Status: DC
  Administered 2019-11-18 – 2019-12-05 (×33): 25 mg via ORAL
  Filled 2019-11-18 (×34): qty 1

## 2019-11-18 MED ORDER — SODIUM CHLORIDE 0.9 % IV SOLN: INTRAVENOUS | Status: DC

## 2019-11-18 NOTE — Progress Notes (Signed)
Physical Therapy Session Note  Patient Details  Name: Sarah Delacruz MRN: [id number] Date of Birth: [birth year]  Today's Date: 11/18/2019 PT Individual Time: 0801-0846 PT Individual Time Calculation (min): 45 min   and Today's Date: 11/18/2019 PT Missed Time: 30 Minutes Missed Time Reason: Patient fatigue;Patient ill (Comment)(low BP, EKG testing)  Short Term Goals: Week 2:  PT Short Term Goal 1 (Week 2): Pt will perform supine<>sit with min assist PT Short Term Goal 2 (Week 2): Pt will perform sit<>stand with mod assist of 1 PT Short Term Goal 3 (Week 2): Pt will perform bed<>chair transfers with mod assist of 1 PT Short Term Goal 4 (Week 2): Pt will ambulate at least 110ft using LRAD with mod assist of 1  Skilled Therapeutic Interventions/Progress Updates:    Patient supine in bed with HOB elevated eating breakfast with NT providing supervision upon PT arrival, agreeable to therapy tx, denies pain. RN in/out to administer medications. MD in/out for morning assessment - MD is aware of pt's low BP readings and high HR (listed below) and altering medications as necessary. Therapist donned on B shoes totalA. Supine > sitting EOB with minA for LE management and trunk upright, cues for sequencing of BLE and hand placement. Pt performed forward scooting to EOB in preparation to stand requiring min-modA for hip positioning/scooting. Sit > standing at RW min-modA with cues for feet placement and sequencing of securing L hand and pushing up with R hand. Therapist noted pt sweating through night gown and having c/o being too hot 'needing her fan'. Stand pivot transfer > BSC with RW maxA + 2 for weight shifting, sequencing, and second helper to manage RW. In standing, therapist performed clothing management totalA. Stand > sit with minA for positioning and controlled descent. Pt continent of bladder and performed pericare without assist. While seated on BSC, therapist assessed vitals as pt had c/o dizziness and  saying "I don't feel good" repeatedly, BP 99/72 HR 115. Sit > stand with RW from [facility] with minA, stand pivot > bed modA + 2 for weight shifting, extensive sequencing cues and second helper for RW management. Sitting EOB > supine in bed with HOB elevated modA for LE management and trunk positioning. Therapist assessed vitals again: BP 86/72 HR 128, after ~5 minutes BP 87/52 HR 116. While pt supine, therapist performed the following: low load, long duration stretching of BLE hamstrings and heel cords, and LUE wrist flexion/extension, elbow flexion/extension and shoulder ER. Therapist donned B TEDs total A and made the bed flat with BLEs slightly elevated and reassessed vitals: BP 92/48 HR 115. Heart and vascular in for EKG assessment. Therapist verbalized for heart & vascular to lower pt's bed once assessment complete, they agreed. Pt left supine in bed with needs in reach and bed alarm set in care of heart & vascular. Therapist communicated vitals with pt's RN.   Therapy Documentation Precautions:  Precautions Precautions: Fall, Other (comment) Precaution Comments: left inattention, extensor patterns in LEs and trunk Restrictions Weight Bearing Restrictions: No   Therapy/Group: Individual Therapy  Blima Ledger SPT 11/18/2019, 7:28 AM

## 2019-11-18 NOTE — Progress Notes (Signed)
Speech Language Pathology Daily Session Note  Patient Details  Name: Sarah Delacruz MRN: [id number] Date of Birth: 04/20/1924  Today's Date: 11/18/2019 SLP Individual Time: 1001-1016 SLP Individual Time Calculation (min): 15 min  Short Term Goals: Week 2: SLP Short Term Goal 1 (Week 2): Pt will consume current diet minimal overt s/sx aspiration and efficient mastication and oral clearance of solids with Min A multimodal cues for use of swallow strategies. SLP Short Term Goal 2 (Week 2): Pt will sustain attention to tasks for 30 minute intervals with Min A cues for redirection. SLP Short Term Goal 3 (Week 2): Pt will demonstrate ability to recall new and daily information with Mod A multimodal cues for compensatory strategies. SLP Short Term Goal 4 (Week 2): Pt will demonstrate ability to problem solve during basic familiar tasks with Max A verbal/visual cues. SLP Short Term Goal 5 (Week 2): Pt will scan left visual field and locate items on left side of environment in 75% opportunities with Min A multimodal cues. SLP Short Term Goal 6 (Week 2): Pt will communicate at the word and phrase level with Mod A cues for use of speech intelligibility strategies to achieve ~70% intelligibility.  Skilled Therapeutic Interventions: Pt was seen for skilled ST targeting cognitive goals. Pt admitted feeling groggy today (has had fluctuations in HR and BP) but motivated to participate today and aroused appropriately to medium volume voice and soft touch. She required Max A multimodal cues for problem solving repositioning herself in bed for greater comfort and Mod A verbal cues for redirection to task. Due to pressing nursing needs (IV placement, etc.) pt missed 15 mins of skilled ST. Will continue efforts. Pt left laying in bed with alarm set and needs within reach. Continue per current plan of care.        Pain Pain Assessment Pain Scale: 0-10 Faces Pain Scale: Hurts a little bit Pain Intervention(s):  Medication (See eMAR)  Therapy/Group: Individual Therapy  Little Ishikawa 11/18/2019, 9:18 AM

## 2019-11-18 NOTE — Progress Notes (Signed)
Discussed patient's with Dr. Lostant Lions who evaluated EKG--he felt that changes consistent with sinus tach and to monitor with repeat EKG as HR goes down. This am terazosin was d/c and hydralazine decreased which will allow for better perfusion-- BB increased for rate control. Labs ordered for this am--if dry will hydrate with IVF.  Will monitor.

## 2019-11-18 NOTE — Progress Notes (Signed)
   11/18/19 0153  Provider Notification  Provider Name/Title Delle Reining, PA  Date Provider Notified 11/18/19  Time Provider Notified [phone]  Notification Type Call  Notification Reason Other (Comment) ( HR-on the 115's, denies SOB, denies CP)  Response Other (Comment) (Provider aware)

## 2019-11-18 NOTE — Progress Notes (Signed)
[place] PHYSICAL MEDICINE & REHABILITATION PROGRESS NOTE   Subjective/Complaints: No dizziness , discussed monitoring during trasnfer this am to [facility] Felt tired with PT during transfer   ROS-  Pt denines SOB, abd pain, CP, N/V//D, and vision changes  Objective: No results found. Recent Labs    11/17/19 0621  WBC 6.1  HGB 9.4*  HCT 29.8*  PLT 323   No results for input(s): NA, K, CL, CO2, GLUCOSE, BUN, CREATININE, CALCIUM in the last 72 hours.  Intake/Output Summary (Last 24 hours) at 11/18/2019 0808 Last data filed at 11/18/2019 0325 Gross per 24 hour  Intake 450 ml  Output 350 ml  Net 100 ml     Physical Exam: Vital Signs Blood pressure (!) 98/53, pulse (!) 122, temperature 98.2 F (36.8 C), resp. rate 16, height 5\' 6"  (1.676 m), weight 53.2 kg, SpO2 98 %. General: No acute distress, sitting up comfortably in bed.  Mood and affect are appropriate Heart: Regular rate and rhythm no rubs murmurs or extra sounds Lungs: Clear to auscultation, breathing unlabored, no rales or wheezes Abdomen: Positive bowel sounds, soft nontender to palpation, nondistended Extremities: No clubbing, cyanosis, or edema Skin: No evidence of breakdown, no evidence of rash Neurologic: Motor strength is 4/5 in RIght 0/5 left  deltoid, bicep, tricep, grip, 4/5 RIght , 3- Left hip flexor, knee extensors,4/5 RIght and tr/5 left  ankle dorsiflexor and plantar flexor Musculoskeletal:No joint swelling  Assessment/Plan: 1. Functional deficits secondary to Right MCA infarct  which require 3+ hours per day of interdisciplinary therapy in a comprehensive inpatient rehab setting.  Physiatrist is providing close team supervision and 24 hour management of active medical problems listed below.  Physiatrist and rehab team continue to assess barriers to discharge/monitor patient progress toward functional and medical goals  Care Tool:  Bathing    Body parts bathed by patient: Left arm, Chest, Abdomen,  Right upper leg, Left upper leg, Face   Body parts bathed by helper: Right lower leg, Left lower leg, Front perineal area, Buttocks, Right arm Body parts n/a: Right arm, Left arm, Chest, Abdomen, Buttocks, Right upper leg, Left upper leg, Left lower leg, Right lower leg   Bathing assist Assist Level: Maximal Assistance - Patient 24 - 49%     Upper Body Dressing/Undressing Upper body dressing   What is the patient wearing?: Pull over shirt    Upper body assist Assist Level: Maximal Assistance - Patient 25 - 49%    Lower Body Dressing/Undressing Lower body dressing      What is the patient wearing?: Incontinence brief, Pants     Lower body assist Assist for lower body dressing: 2 Helpers     Toileting Toileting    Toileting assist Assist for toileting: Dependent - Patient 0%     Transfers Chair/bed transfer  Transfers assist     Chair/bed transfer assist level: Maximal Assistance - Patient 25 - 49%     Locomotion Ambulation   Ambulation assist   Ambulation activity did not occur: Safety/medical concerns(poor balance/high fall risk)  Assist level: 2 helpers(mod A and +2 w/c follow) Assistive device: Walker-Eva Max distance: 61ft   Walk 10 feet activity   Assist  Walk 10 feet activity did not occur: Safety/medical concerns(poor balance/high fall risk)  Assist level: 2 helpers(mod A and +2 w/c follow) Assistive device: Walker-Eva   Walk 50 feet activity   Assist Walk 50 feet with 2 turns activity did not occur: Safety/medical concerns(poor balance/high fall risk)  Assist level:  Moderate Assistance - Patient - 50 - 74%(mod A and +2 w/c follow) Assistive device: Walker-Eva    Walk 150 feet activity   Assist Walk 150 feet activity did not occur: Safety/medical concerns(poor balance/high fall risk)         Walk 10 feet on uneven surface  activity   Assist Walk 10 feet on uneven surfaces activity did not occur: Safety/medical concerns(poor  balance/high fall risk)         Wheelchair     Assist Will patient use wheelchair at discharge?: Yes Type of Wheelchair: Manual    Wheelchair assist level: Dependent - Patient 0% Max wheelchair distance: 150 ft    Wheelchair 50 feet with 2 turns activity    Assist        Assist Level: Dependent - Patient 0%   Wheelchair 150 feet activity     Assist      Assist Level: Dependent - Patient 0%   Blood pressure (!) 98/53, pulse (!) 122, temperature 98.2 F (36.8 C), resp. rate 16, height 5\' 6"  (1.676 m), weight 53.2 kg, SpO2 98 %.   Medical Problem List and Plan: 1.  Left hemiparesis with sensory deficits, L-HH with right inattention, pusher tendencies to the left/back, severe dysarthria secondary to right MCA infarct.             -patient may shower             -ELOS/Goals: 4/2             Continue CIR PT, OT . SLP- reduce to 15/7, team conf in am  2.  Antithrombotics: -DVT/anticoagulation:  Pharmaceutical: Lovenox             -antiplatelet therapy: ASA daily.  3. Pain Management: Tylenol prn. Well controlled.  4. Mood: LCSW to follow for evaluation and support.              -antipsychotic agents: N/A 5. Neuropsych: This patient is? capable of making decisions on her own behalf. 6. Skin/Wound Care: Routine pressure relief measure.  7. Fluids/Electrolytes/Nutrition: Monitor I/O.  IVF at nights for hydration.  8. HTN: Monitor BP tid--continue hydralazine and BB (also was on microzide in the past)  Vitals:   11/18/19 0328 11/18/19 0636  BP: (!) 107/55 (!) 98/53  Pulse: (!) 120 (!) 122  Resp: 16 16  Temp: 98.5 F (36.9 C) 98.2 F (36.8 C)  SpO2: 98% 98%  Improved with hydralazine to 25mg  TID,now hypotensive will reduce to 10mg  , increase lopressor to 25 mg  9. Chronic diastolic CHF/Valvular disease: Monitor weights daily with I/O. Continue hydralazine, metoprolol, pravastatin and ASA. No ARB/ACE due to CKD.              Daily weights Filed Weights    11/16/19 0414 11/17/19 0259 11/18/19 0636  Weight: 54.4 kg 54.2 kg 53.2 kg  weight down, low fluid intake , will encourage  10. Dyslipidemia: On pravastatin.  11. GERD: resume PPI.  12. Chronic constipation: Continue Senna.  Adjust bowel meds as necessary. 13. CKD III: Monitor renal status with serial checks.  CMP ordered. 14. GU: now voiding but will d/c terazosin due to low BP, monitor for retention  15.  Post stroke dysphagia- given  degree of dysarthria expect severe oral phase issues as well              Pure with honey thick liquids, advance diet as tolerated   Will discuss f/u MBS with SLP this week ,  oral phase may be main issue  16.  Bowel incont lkely multifactorial with reduced awareness, delay in notifying nurse and difficulty getting g on bedpan  17. Anemia: Hgb decreased to 9.4 from 11.8 one week ago. Had been stable in 11s prior to this. Ordered heme occult.No sample thus far  Recheck CBC in am   LOS: 13 days A FACE TO Iola E Marvena Tally 11/18/2019, 8:08 AM

## 2019-11-18 NOTE — Progress Notes (Signed)
Speech Language Pathology Daily Session Note  Patient Details  Name: Sarah Delacruz MRN: [id number] Date of Birth: [birth year]  Today's Date: 11/18/2019 SLP Individual Time: 3009-2330 SLP Individual Time Calculation (min): 30 min  Short Term Goals: Week 1: SLP Short Term Goal 1 (Week 1): Pt will consume current diet minimal overt s/sx aspiration and efficient mastication and oral clearance of solids with Max A multimodal cues for use of swallow strategies. SLP Short Term Goal 1 - Progress (Week 1): Met SLP Short Term Goal 2 (Week 1): Pt will sustain attention to tasks for 15 minute intervals with Mod A cues for redirection. SLP Short Term Goal 2 - Progress (Week 1): Met SLP Short Term Goal 3 (Week 1): Pt will demonstrate ability to recall new and daily information with Max A multimodal cues for compensatory strategies. SLP Short Term Goal 3 - Progress (Week 1): Met SLP Short Term Goal 4 (Week 1): Pt will demonstrate ability to problem solve during basic familiar tasks with Max A verbal/visual cues. SLP Short Term Goal 4 - Progress (Week 1): Progressing toward goal SLP Short Term Goal 5 (Week 1): Pt will scan left visual field and locate items on left side of environment in 50% opportunities with Max A multimodal cues. SLP Short Term Goal 5 - Progress (Week 1): Met SLP Short Term Goal 6 (Week 1): Pt will communicate at the word and phrase level with Max A cues for use of speech intelligibility strategies to achieve ~60% intelligibility. SLP Short Term Goal 6 - Progress (Week 1): Met  Skilled Therapeutic Interventions: Pt was seen for skilled ST targeting dysphagia and cognition. SLP provided assistance with repositioning and tray placement for more efficient and safe PO intake; education to NT providing supervision provided. Pt consumed small amount of Dys 2 lunch solids with mildly prolonged but functional mastication and oral clearance with Min A verbal cues for correction anterior loss. No  overt s/sx noted throughout intake of solid or thin liquids. Recommend continue current diet. Pt required a Moderate amount of verbal and visual cues for visual scanning and locating items on left side of tray, as well as basic problem solving during her lunch. In functional conversation, Mod A verbal cues and feedback provided for pt to verbally recall events of this morning including low blood pressure and high HR. She did report recalling feeling dizzy and issues with BP. Total A required for use of schedule as external aid to recall therapist names and anticipate future appointments. Pt left laying semi-reclined in bed with alarm set and needs within reach. Continue per current plan of care.          Pain Pain Assessment Pain Scale: Faces Faces Pain Scale: No hurt  Therapy/Group: Individual Therapy  Arbutus Leas 11/18/2019, 12:56 PM

## 2019-11-18 NOTE — Progress Notes (Addendum)
Occupational Therapy Session Note  Patient Details  Name: Sarah Delacruz MRN: [id number] Date of Birth: December 10, 1923  Today's Date: 11/18/2019 OT Individual Time: 1100-1145 OT Individual Time Calculation (min): 45 min   OT Individual Time: 1445-1530 OT Individual Time Calculation (min): 45 min  Short Term Goals: Week 2:  OT Short Term Goal 1 (Week 2): Patient will complete UB dressing with Mod A in supported sitting with no more than Mod multimodal cues. OT Short Term Goal 2 (Week 2): Patient will demonstrate dynamic sitting balance unsupported with Mod A in prep for UB bathing/dressing tasks. OT Short Term Goal 3 (Week 2): Patient will complete sit > stand transfers with Mod A +2 helpers. OT Short Term Goal 4 (Week 2): Pt will complete UB bathing in supported sitting with min assist.  Skilled Therapeutic Interventions/Progress Updates:  Session 1: Patient met lying supine in bed in agreement with bed level OT treatment session with focus on NMR and therapeutic activity. Order for bed level activity secondary to hypotension and elevated HR with concern for sinus tach. Vitals monitored throughout. Please refer to vitals section below. Patient declined dressing tasks at bed level. Face washing with set-up assist. Patient able to grasp towel in R hand and squeeze to remove excess water. Repositioning in supine with patient able to complete supine scoot toward [facility] with Min A and Maximal multimodal cues for sequencing and hand placement. Self-ROM HEP with Maximal cueing for pace and form. Patient left lying supine in bed with call bell within reach, all needs met, and bed alarm activated.   Session  2: Patient met lying supine in bed in agreement with OT treatment session. Patient still with orders for bed level activity only secondary to concerns for BP and HR. RLE AAROM and LLE AROM HEP at bed level to increase independence with bed mobility in prep for self-care tasks. Patient tolerated 2 sets x 10  reps each of hip abduction/adduction, knee flexion/extension, and hip flexion/extension without c/o pain. Patient able to roll in supine to L with Mod A and to R with Max A requiring support from therapist to maintain position. Anterior scoot toward [facility] with patient able to boost self up with use of RUE on bed railing. Patient left lying supine in bed with HOB elevated, call bell within reach, and bed alarm activated.   Therapy Documentation Precautions:  Precautions Precautions: Fall, Other (comment) Precaution Comments: left inattention, extensor patterns in LEs and trunk Restrictions Weight Bearing Restrictions: No  Vitals Session 1: Upon entry:  BP: 117/70mHg HR: 69bpm At conclusion of session:  BP: 127/547mg HR: 69bpm  Session 2: Upon entry:  BP: 134/9325m HR: 75bpm At conclusion of session:  BP: 129/27m47mHR: 69bpm Therapy/Group: Individual Therapy  Aleiyah Halpin R Howerton-Davis 11/18/2019, 7:53 AM

## 2019-11-19 ENCOUNTER — Inpatient Hospital Stay (HOSPITAL_COMMUNITY): Payer: Medicare Other | Admitting: Physical Therapy

## 2019-11-19 ENCOUNTER — Inpatient Hospital Stay (HOSPITAL_COMMUNITY): Payer: Medicare Other | Admitting: Occupational Therapy

## 2019-11-19 ENCOUNTER — Inpatient Hospital Stay (HOSPITAL_COMMUNITY): Payer: Medicare Other | Admitting: Speech Pathology

## 2019-11-19 LAB — BASIC METABOLIC PANEL
Anion gap: 8 (ref 5–15)
BUN: 21 mg/dL (ref 8–23)
CO2: 22 mmol/L (ref 22–32)
Calcium: 8.5 mg/dL — ABNORMAL LOW (ref 8.9–10.3)
Chloride: 109 mmol/L (ref 98–111)
Creatinine, Ser: 1.45 mg/dL — ABNORMAL HIGH (ref 0.44–1.00)
GFR calc Af Amer: 36 mL/min — ABNORMAL LOW (ref >60)
GFR calc non Af Amer: 31 mL/min — ABNORMAL LOW (ref >60)
Glucose, Bld: 88 mg/dL (ref 70–99)
Potassium: 3.6 mmol/L (ref 3.5–5.1)
Sodium: 139 mmol/L (ref 135–145)

## 2019-11-19 LAB — CBC
HCT: 29.8 % — ABNORMAL LOW (ref 36.0–46.0)
Hemoglobin: 9.4 g/dL — ABNORMAL LOW (ref 12.0–15.0)
MCH: 29.2 pg (ref 26.0–34.0)
MCHC: 31.5 g/dL (ref 30.0–36.0)
MCV: 92.5 fL (ref 80.0–100.0)
Platelets: 304 10*3/uL (ref 150–400)
RBC: 3.22 MIL/uL — ABNORMAL LOW (ref 3.87–5.11)
RDW: 13.9 % (ref 11.5–15.5)
WBC: 5.7 10*3/uL (ref 4.0–10.5)
nRBC: 0 % (ref 0.0–0.2)

## 2019-11-19 LAB — OCCULT BLOOD X 1 CARD TO LAB, STOOL: Fecal Occult Bld: NEGATIVE

## 2019-11-19 MED ORDER — SODIUM CHLORIDE 0.45 % IV SOLN: INTRAVENOUS | Status: DC

## 2019-11-19 NOTE — Progress Notes (Addendum)
Occupational Therapy Session Note  Patient Details  Name: Sarah Delacruz MRN: [id number] Date of Birth: [birth year]  Today's Date: 11/19/2019 OT Individual Time: 2440-1027 OT Individual Time Calculation (min): 58 min    Short Term Goals: Week 2:  OT Short Term Goal 1 (Week 2): Patient will complete UB dressing with Mod A in supported sitting with no more than Mod multimodal cues. OT Short Term Goal 2 (Week 2): Patient will demonstrate dynamic sitting balance unsupported with Mod A in prep for UB bathing/dressing tasks. OT Short Term Goal 3 (Week 2): Patient will complete sit > stand transfers with Mod A +2 helpers. OT Short Term Goal 4 (Week 2): Pt will complete UB bathing in supported sitting with min assist.  Skilled Therapeutic Interventions/Progress Updates:  Patient met lying supine in bed in agreement with OT treatment session. Vitals assessed throughout session as patient remains on bedrest with approval for bed level self-care tasks. Patient tolerated supine and EOB activity. Patient able to roll to L with Mod A and complete supine to EOB with Mod A and cueing for hooking RLE under LLE for advancement toward EOB. Patient demonstrates increased static and dynamic sitting balance requiring close supervision to maintain static balance and Mod A to maintain dynamic sitting balance during self-care tasks including washing/drying perineum. UB bath/dress seated EOB with Mod A and LB bath/dress with Max A and cueing for sequencing, L attention, and posture/positioning with occasional posterior lean (improved since eval). Sit to stand from EOB to RW with Mod A. Noted increased tone/rigidity in LUE. Patient unable to attain full standing position with hips and bilateral knees flexed but able to maintain standing position with Mod A requiring second helper to wash buttocks. Patient completed LB dress with Mod A +2 for washing buttocks in standing. Sitting rest breaks encouraged with patient remaining  non-symptomatic throughout session. Patient returned to supine with Mod A to assist LLE to bed level and to bring trunk to lying position. Session concluded with patient lying supine in bed with call bell within reach, all needs met, and bed alarm activated.    Therapy Documentation Precautions:  Precautions Precautions: Fall, Other (comment) Precaution Comments: left inattention, extensor patterns in LEs and trunk Restrictions Weight Bearing Restrictions: No  Vitals Upon entry with patient in supine:  HR: 71bpm BP: 119/74mHg   Therapy/Group: Individual Therapy  Sarah Delacruz 11/19/2019, 11:01 AM

## 2019-11-19 NOTE — Progress Notes (Signed)
Speech Language Pathology Daily Session Note  Patient Details  Name: Geneive Sandstrom MRN: [id number] Date of Birth: 04/05/1924  Today's Date: 11/19/2019 SLP Individual Time: 8299-3716 SLP Individual Time Calculation (min): 45 min  Short Term Goals: Week 2: SLP Short Term Goal 1 (Week 2): Pt will consume current diet minimal overt s/sx aspiration and efficient mastication and oral clearance of solids with Min A multimodal cues for use of swallow strategies. SLP Short Term Goal 2 (Week 2): Pt will sustain attention to tasks for 30 minute intervals with Min A cues for redirection. SLP Short Term Goal 3 (Week 2): Pt will demonstrate ability to recall new and daily information with Mod A multimodal cues for compensatory strategies. SLP Short Term Goal 4 (Week 2): Pt will demonstrate ability to problem solve during basic familiar tasks with Max A verbal/visual cues. SLP Short Term Goal 5 (Week 2): Pt will scan left visual field and locate items on left side of environment in 75% opportunities with Min A multimodal cues. SLP Short Term Goal 6 (Week 2): Pt will communicate at the word and phrase level with Mod A cues for use of speech intelligibility strategies to achieve ~70% intelligibility.  Skilled Therapeutic Interventions: Pt was seen for skilled ST targeting dysphagia and cognition. Although extra time required, during consumption of puree and Dys 2 (minced/ground) breakfast solids, pt achieved full oral clearance with only Supervision A level cues, Min A for awareness and correcting anterior loss of pudding. Mod A verbal and visual cues were required for locating items on left side of tray and Min A for basic problem solving during set up. Max A faded to Mod A (~ 2/3 way through) verbal and visual cues for problem solving and error awareness required during a basic 3-step action card sequencing task. Pt left laying in bed with alarm set and needs within reach. Continue per current plan of care.          Pain Pain Assessment Pain Scale: 0-10 Pain Score: 0-No pain  Therapy/Group: Individual Therapy  Little Ishikawa 11/19/2019, 6:58 AM

## 2019-11-19 NOTE — Progress Notes (Signed)
Physical Therapy Note  Patient Details  Name: Sarah Delacruz MRN: [id number] Date of Birth: Jun 09, 1924 Today's Date: 11/19/2019    Pt's plan of care adjusted to 15/7 after speaking with care team and discussed with MD in team conference as pt currently unable to tolerate current therapy schedule with OT, PT, and SLP.    Cresenciano Genre, PT, DPT, CSRS 11/19/2019, 1:18 PM

## 2019-11-19 NOTE — Progress Notes (Signed)
[facility] PHYSICAL MEDICINE & REHABILITATION PROGRESS NOTE   Subjective/Complaints: Does not drink a lot   ROS-  Pt denines SOB, abd pain, CP, N/V//D, and vision changes  Objective: No results found. Recent Labs    11/18/19 0955 11/19/19 0005  WBC 6.0 5.7  HGB 9.7* 9.4*  HCT 30.6* 29.8*  PLT 335 304   Recent Labs    11/18/19 0955 11/19/19 0005  NA 137 139  K 3.8 3.6  CL 103 109  CO2 22 22  GLUCOSE 121* 88  BUN 21 21  CREATININE 1.51* 1.45*  CALCIUM 8.7* 8.5*    Intake/Output Summary (Last 24 hours) at 11/19/2019 1045 Last data filed at 11/19/2019 0800 Gross per 24 hour  Intake 1788.23 ml  Output --  Net 1788.23 ml     Physical Exam: Vital Signs Blood pressure (!) 119/40, pulse 70, temperature 98.6 F (37 C), resp. rate 17, height _0  (1.676 m), weight 53.2 kg, SpO2 97 %.  General: No acute distress Mood and affect are appropriate Heart: Regular rate and rhythm no rubs murmurs or extra sounds Lungs: Clear to auscultation, breathing unlabored, no rales or wheezes Abdomen: Positive bowel sounds, soft nontender to palpation, nondistended Extremities: No clubbing, cyanosis, or edema Skin: No evidence of breakdown, no evidence of rash  Neurologic: Motor strength is 4/5 in RIght 0/5 left  deltoid, bicep, tricep, grip, 4/5 RIght , 3- Left hip flexor, knee extensors,4/5 RIght and tr/5 left  ankle dorsiflexor and plantar flexor Musculoskeletal:No joint swelling  Assessment/Plan: 1. Functional deficits secondary to Right MCA infarct  which require 3+ hours per day of interdisciplinary therapy in a comprehensive inpatient rehab setting.  Physiatrist is providing close team supervision and 24 hour management of active medical problems listed below.  Physiatrist and rehab team continue to assess barriers to discharge/monitor patient progress toward functional and medical goals  Care Tool:  Bathing    Body parts bathed by patient: Left arm, Chest, Abdomen,  Right upper leg, Left upper leg, Face   Body parts bathed by helper: Right lower leg, Left lower leg, Front perineal area, Buttocks, Right arm Body parts n/a: Right arm, Left arm, Chest, Abdomen, Buttocks, Right upper leg, Left upper leg, Left lower leg, Right lower leg   Bathing assist Assist Level: Maximal Assistance - Patient 24 - 49%     Upper Body Dressing/Undressing Upper body dressing   What is the patient wearing?: Pull over shirt    Upper body assist Assist Level: Maximal Assistance - Patient 25 - 49%    Lower Body Dressing/Undressing Lower body dressing      What is the patient wearing?: Incontinence brief, Pants     Lower body assist Assist for lower body dressing: 2 Helpers     Toileting Toileting    Toileting assist Assist for toileting: Dependent - Patient 0%     Transfers Chair/bed transfer  Transfers assist     Chair/bed transfer assist level: Maximal Assistance - Patient 25 - 49%     Locomotion Ambulation   Ambulation assist   Ambulation activity did not occur: Safety/medical concerns(poor balance/high fall risk)  Assist level: 2 helpers(mod A and +2 w/c follow) Assistive device: Walker-Eva Max distance: 96f   Walk 10 feet activity   Assist  Walk 10 feet activity did not occur: Safety/medical concerns(poor balance/high fall risk)  Assist level: 2 helpers(mod A and +2 w/c follow) Assistive device: Walker-Eva   Walk 50 feet activity   Assist Walk 50 feet with  2 turns activity did not occur: Safety/medical concerns(poor balance/high fall risk)  Assist level: Moderate Assistance - Patient - 50 - 74%(mod A and +2 w/c follow) Assistive device: Walker-Eva    Walk 150 feet activity   Assist Walk 150 feet activity did not occur: Safety/medical concerns(poor balance/high fall risk)         Walk 10 feet on uneven surface  activity   Assist Walk 10 feet on uneven surfaces activity did not occur: Safety/medical concerns(poor  balance/high fall risk)         Wheelchair     Assist Will patient use wheelchair at discharge?: Yes Type of Wheelchair: Manual    Wheelchair assist level: Dependent - Patient 0% Max wheelchair distance: 150 ft    Wheelchair 50 feet with 2 turns activity    Assist        Assist Level: Dependent - Patient 0%   Wheelchair 150 feet activity     Assist      Assist Level: Dependent - Patient 0%   Blood pressure (!) 119/40, pulse 70, temperature 98.6 F (37 C), resp. rate 17, height _0  (1.676 m), weight 53.2 kg, SpO2 97 %.   Medical Problem List and Plan: 1.  Left hemiparesis with sensory deficits, L-HH with right inattention, pusher tendencies to the left/back, severe dysarthria secondary to right MCA infarct.             -patient may shower             -ELOS/Goals: 4/2             Continue CIR PT, OT . SLP- reduce to 15/7,  Team conference today please see physician documentation under team conference tab, met with team  to discuss problems,progress, and goals. Formulized individual treatment plan based on medical history, underlying problem and comorbidities.  2.  Antithrombotics: -DVT/anticoagulation:  Pharmaceutical: Lovenox             -antiplatelet therapy: ASA daily.  3. Pain Management: Tylenol prn. Well controlled.  4. Mood: LCSW to follow for evaluation and support.              -antipsychotic agents: N/A 5. Neuropsych: This patient is? capable of making decisions on her own behalf. 6. Skin/Wound Care: Routine pressure relief measure.  7. Fluids/Electrolytes/Nutrition: Monitor I/O.  IVF at nights for hydration.  8. HTN: Monitor BP tid--continue hydralazine and BB (also was on microzide in the past)  Vitals:   11/19/19 0858 11/19/19 0900  BP: (!) 115/52 (!) 119/40  Pulse: 76 70  Resp: 17   Temp: 98.6 F (37 C)   SpO2: 97%   Improved with hydralazine to 9m TID,now hypotensive will reduce to 180m, increase lopressor to 25 mg, HR improved  BP normalized   9. Chronic diastolic CHF/Valvular disease: Monitor weights daily with I/O. Continue hydralazine, metoprolol, pravastatin and ASA. No ARB/ACE due to CKD.              Daily weights Filed Weights   11/16/19 0414 11/17/19 0259 11/18/19 0636  Weight: 54.4 kg 54.2 kg 53.2 kg  weight down, low fluid intake , will encourage , IVF  10. Dyslipidemia: On pravastatin.  11. GERD: resume PPI.  12. Chronic constipation: Continue Senna.  Adjust bowel meds as necessary. 13. CKD III: Monitor renal status with serial checks.  CMP ordered. 14. GU: now voiding but will d/c terazosin due to low BP, monitor for retention  15.  Post stroke dysphagia- given  degree of dysarthria expect severe oral phase issues as well              Pure with honey thick liquids, advance diet as tolerated   Will discuss f/u MBS with SLP this week , oral phase may be main issue  16.  Bowel incont lkely multifactorial with reduced awareness, delay in notifying nurse and difficulty getting g on bedpan  17. Anemia: Hgb decreased to 9.4 from 11.8 one week ago. Had been stable in 11s prior to this. Ordered heme occult.No sample thus far  Recheck CBC stable , fecal OB neg , may be dilutional due to IVF  LOS: 14 days A FACE TO FACE EVALUATION WAS PERFORMED  Charlett Blake 11/19/2019, 10:45 AM

## 2019-11-19 NOTE — Progress Notes (Signed)
Physical Therapy Session Note  Patient Details  Name: Sarah Delacruz MRN: [id number] Date of Birth: Feb 11, 1924  Today's Date: 11/19/2019 PT Individual Time: 1335-1445 PT Individual Time Calculation (min): 70 min   Short Term Goals: Week 2:  PT Short Term Goal 1 (Week 2): Pt will perform supine<>sit with min assist PT Short Term Goal 2 (Week 2): Pt will perform sit<>stand with mod assist of 1 PT Short Term Goal 3 (Week 2): Pt will perform bed<>chair transfers with mod assist of 1 PT Short Term Goal 4 (Week 2): Pt will ambulate at least 48ft using LRAD with mod assist of 1  Skilled Therapeutic Interventions/Progress Updates:    Patient received in bed, assisted RN with rolling patient side to side with Mod-MaxA with use of railings for use of bedpan- able to urinate, RN aware and then assisted with rolling with side to side for donning of pants and brief. Able to then perform supine to sit with MinA and cues for sequencing, and actually able to perform stand-pivot transfer to [facility] with MinAx2 and RW/hand orthosis today!! Also made some progress with gait tolerated gait training 67ft, 71ft, and 25ft with ModA mostly for control of RW as well as Mod VC for sequencing with stepping, but easily fatigued with repeated bouts of gait this afternoon. Otherwise worked on standing balance and tolerance without BUE support with Min guard-MinA for balance/safety on each side while working on colored peg puzzle- able to maintain balance with light levels of assist however did require heavy MaxA for cognitive processing and problem solving with peg puzzle. She was returned to her room totalA in [facility] and left up in TIS chair with seatbelt alarm active and daughter present.   Therapy Documentation Precautions:  Precautions Precautions: Fall, Other (comment) Precaution Comments: left inattention, extensor patterns in LEs and trunk Restrictions Weight Bearing Restrictions: No Pain: Pain Assessment Pain Scale:  Faces Pain Score: 0-No pain Faces Pain Scale: No hurt    Therapy/Group: Individual Therapy   Madelaine Etienne, DPT, PN1   Supplemental Physical Therapist [facility]    Pager [phone] Acute Rehab Office [phone]    11/19/2019, 3:35 PM

## 2019-11-19 NOTE — Patient Care Conference (Signed)
Inpatient RehabilitationTeam Conference and Plan of Care Update Date: 11/19/2019   Time: 10:35 AM    Patient Name: Sarah Delacruz      Medical Record Number: [id number]  Date of Birth: 1924-07-06 Sex: Female         Room/Bed: 4W12C/4W12C-02 Payor Info: Payor: MEDICARE / Plan: MEDICARE PART A AND B / Product Type: *No Product type* /    Admit Date/Time:  11/05/2019  1:45 PM  Primary Diagnosis:  Acute right MCA stroke [facility])  Patient Active Problem List   Diagnosis Date Noted  . Acute right MCA stroke (HCC) 11/05/2019  . Acute cerebral infarction (HCC)   . Stage 3 chronic kidney disease   . Chronic constipation   . Chronic diastolic congestive heart failure (HCC)   . Essential hypertension   . Dysphagia, post-stroke     Expected Discharge Date: Expected Discharge Date: 12/05/19  Team Members Present: Physician leading conference: Dr. Claudette Laws Care Coodinator Present: Cheyenne Adas, RN, BSN, CRRN;Genie Franke Menter, RN, MSN Nurse Present: Willey Blade, RN PT Present: Woodfin Ganja, PT OT Present: Perrin Maltese, OT SLP Present: Suzzette Righter, CF-SLP PPS Coordinator present : Edson Snowball, Park Breed, SLP     Current Status/Progress Goal Weekly Team Focus  Bowel/Bladder   continent of bowel and bladder; incontinent at times; [facility] 3/22  train bladder/bowel  assess q shift/prn   Swallow/Nutrition/ Hydration   Dys 2 solids, thin liquids, Min-Mod A strategies  Min A  tolerance of thin and Dys 2, work toward advancing solid trials and increasing independence with use of swallow strategies   ADL's   Mod assist for UB bathing with total +2 for LB bathing and dressing supine to sitting to standing.  Max assist for UB dressing as well.  Mod to max assist for stand pivot transfers.  Still with severe extensor tone in the left elbow and digits.  She needs max hand over hand for integration into bathing tasks.  min to mod assist overall  selfcare retraining, tranfser training,  neuromuscular re-education, balance retraining, DME education, therapeutic activites. therapeutic exercise   Mobility   min A rolling L, max rolling R due to poor motor planning, min-mod A supine>sitting, min-mod (sometimes max depending on posterior lean) assist +2 stand pivot RW, min-mod A sit<>stand @ R hallway rail due to L posterolateral lean, gait @ R hallway rail 46ft w/ mod A, or with eva walker x20 ft mod assist, mod/max 4 steps (+2 for safety) B rails  min-modA  sit <> stand transfers, stand pivot transfers, balance, strengthening, endurance, NMR, gait training   Communication   Mod A use of increased vocal intensity, Max A articulatory cues  Min A  intelligibility strategies at phrase and sentence level, emphasis on overarticulation   Safety/Cognition/ Behavioral Observations  Mod A recall, problem solving, intellectual awareness, visual scanning, Min-Mod sustained attention  Min A  visual scanning, functional problem solving, intellectual awareness, sustained attention   Pain   no c/o pain  remain pain free  assess q shift/prn   Skin   MASD buttocks  prevent further breakdown/infection  assess q shift/prn' cleanse/apply cream prn    Rehab Goals Patient on target to meet rehab goals: Yes *See Care Plan and progress notes for long and short-term goals.     Barriers to Discharge  Current Status/Progress Possible Resolutions Date Resolved   Nursing                  PT  OT                  SLP                SW Decreased caregiver support;Incontinence Grand daughter will assist daughter providing care; planning to secure hired help to assist daughter with patient care [facility] services set up and access to incontinence supplies initiated          Discharge Planning/Teaching Needs:  Home with daughter  Transfers, Toileting, Bathing, Medications, etc   Team Discussion: MD monitoring BP, med adjustments, urinary retention resolved.  RN BM today loose, inc B/B, IV  fluids, BP better, not drinking much, LUE tone more.  OT mod UB B, max UB D, LB B/D total +2, transfers mod to total, pusher to left, goals min/mod A.  PT bed min to max, min to mod supine to sit, pivot +2 with walker, goals min/mod A.  Dtr can do min A, GDtr can assist too.   Revisions to Treatment Plan: N/A     Medical Summary Current Status: No longer requiring catheterization of the bladder.  Blood pressure dropped too low meds were adjusted this week IV fluids were resumed Weekly Focus/Goal: Manage fluids and blood pressure medications  Barriers to Discharge: Medication compliance;Nutrition means   Possible Resolutions to Barriers: Try to push fluids may need to extend rehab stay to achieve goals in order to allow family to care for her at home   Continued Need for Acute Rehabilitation Level of Care: The patient requires daily medical management by a physician with specialized training in physical medicine and rehabilitation for the following reasons: Direction of a multidisciplinary physical rehabilitation program to maximize functional independence : Yes Medical management of patient stability for increased activity during participation in an intensive rehabilitation regime.: Yes Analysis of laboratory values and/or radiology reports with any subsequent need for medication adjustment and/or medical intervention. : Yes   I attest that I was present, lead the team conference, and concur with the assessment and plan of the team.   [name] 11/19/2019, 4:39 PM   Team conference was held via web/ teleconference due to [place] - 19

## 2019-11-19 NOTE — Progress Notes (Addendum)
This nurse called on-call provider to clarify Q3 vital sign orders. Pam, PA dc'd Q3 vital signs and ordered IV fluids to be saline locked and encourage PO fluid intake. Will resume regular vital signs.

## 2019-11-20 ENCOUNTER — Inpatient Hospital Stay (HOSPITAL_COMMUNITY): Payer: Medicare Other | Admitting: Speech Pathology

## 2019-11-20 ENCOUNTER — Inpatient Hospital Stay (HOSPITAL_COMMUNITY): Payer: Medicare Other | Admitting: Physical Therapy

## 2019-11-20 ENCOUNTER — Inpatient Hospital Stay (HOSPITAL_COMMUNITY): Payer: Medicare Other | Admitting: Occupational Therapy

## 2019-11-20 MED ORDER — GENERIC EXTERNAL MEDICATION
Status: DC
Start: ? — End: 2019-11-20

## 2019-11-20 NOTE — Progress Notes (Signed)
Physical Therapy Weekly Progress Note  Patient Details  Name: Sarah Delacruz MRN: [id number] Date of Birth: [birth year]  Beginning of progress report period: November 13, 2019 End of progress report period: November 20, 2019  Today's Date: 11/20/2019 PT Individual Time: 0952-1020 PT Individual Time Calculation (min): 28 min   Patient has met 0 of 4 short term goals.  Sarah Delacruz is progressing well with therapy although continues to demonstrate impaired activity tolerance, impaired motor planning, L inattention, posterior lean due to trunk and L LE extensor tone, poor awareness of deficits, and L hemiparesis (UE>LE) resulting in need for mod assist for bed mobility, mod assist for sit<>stands, and varying max assist of 1 to +2 assist for bed<>chair transfers, as well as +2 assist for ambulation up to 168f using EEthelene Haland up to 438fusing RW.   Patient continues to demonstrate the following deficits muscle weakness and muscle joint tightness, decreased cardiorespiratoy endurance, impaired timing and sequencing, abnormal tone, unbalanced muscle activation, motor apraxia, decreased coordination and decreased motor planning, decreased attention to left, decreased initiation, decreased attention, decreased awareness, decreased problem solving, decreased safety awareness, decreased memory and delayed processing and decreased sitting balance, decreased standing balance, decreased postural control and decreased balance strategies and therefore will continue to benefit from skilled PT intervention to increase functional independence with mobility.  Patient progressing toward long term goals..  Plan of care revisions: Adjusted pt's POC to 15/7.. Marland KitchenPT Short Term Goals Week 2:  PT Short Term Goal 1 (Week 2): Pt will perform supine<>sit with min assist PT Short Term Goal 1 - Progress (Week 2): Progressing toward goal PT Short Term Goal 2 (Week 2): Pt will perform sit<>stand with mod assist of 1 PT Short Term  Goal 2 - Progress (Week 2): Progressing toward goal PT Short Term Goal 3 (Week 2): Pt will perform bed<>chair transfers with mod assist of 1 PT Short Term Goal 3 - Progress (Week 2): Progressing toward goal PT Short Term Goal 4 (Week 2): Pt will ambulate at least 5069fsing LRAD with mod assist of 1 PT Short Term Goal 4 - Progress (Week 2): Progressing toward goal Week 3:  PT Short Term Goal 1 (Week 3): Pt will perform supine<>sit with min assist PT Short Term Goal 2 (Week 3): Pt will perform sit<>stand using LRAD with mod assist of 1 PT Short Term Goal 3 (Week 3): Pt will perform bed<>chair transfers using LRAD with mod assist of 1 person PT Short Term Goal 4 (Week 3): Pt will ambulate at least 97f23fing LRAD with mod assist of 1 person  Skilled Therapeutic Interventions/Progress Updates:  Ambulation/gait training;DME/adaptive equipment instruction;Psychosocial support;UE/LE Strength taining/ROM;Balance/vestibular training;Functional electrical stimulation;Skin care/wound management;UE/LE Coordination activities;Cognitive remediation/compensation;Functional mobility training;Splinting/orthotics;Visual/perceptual remediation/compensation;Community reintegration;Stair training;Neuromuscular re-education;Wheelchair propulsion/positioning;Discharge planning;Pain management;Therapeutic Activities;Disease management/prevention;Patient/family education;Therapeutic Exercise   Pt received sitting in TIS w/c asleep but awakens to verbal stimulus and agreeable to therapy session. Pt reports needing to use bathroom. Scooting towards front of w/c seat using B UE support on armrests with CGA for steadying - pt demonstrating improvement in performing this task. Sit>stand w/c>RW with mod assist for lifting into standing and pt continuing to demonstrate posterior lean with L LE extensor tone though improving - max cuing for placement of L UE on/off RW orthotic. Stand pivot to [facility]W with mod assist of 1 and +2  providing min assist for safety and pivoting hips. Standing with mod assist of 1 while 2nd person performed total assist LB clothing management.  Pt continent of bladder and required min/mod assist for standing balance with RW while +2 assist performed total assist peri-care and LB clothing management. Gait training ~79f using RW with light mod assist for balance and improved AD management - pt demonstrates lack of L LE hip/knee flexion to perform adequate swing mechanics, decreased L LE stance time, and delayed motor planning as pt perseverating on sitting back in w/c to rest. Pt left seated in TIS w/c with needs in reach, tilted backwards, and seat belt alarm on.   Therapy Documentation Precautions:  Precautions Precautions: Fall, Other (comment) Precaution Comments: left inattention, extensor patterns in LEs and trunk Restrictions Weight Bearing Restrictions: No  Pain:   No reports of pain throughout session.  Therapy/Group: Individual Therapy  CTawana Scale PT, DPT 11/20/2019, 8:08 AM

## 2019-11-20 NOTE — Progress Notes (Signed)
Speech Language Pathology Daily Session Note  Patient Details  Name: Sarah Delacruz MRN: [id number] Date of Birth: 03/23/1924  Today's Date: 11/20/2019 SLP Individual Time: 1431-1458 SLP Individual Time Calculation (min): 27 min  Short Term Goals: Week 3: SLP Short Term Goal 1 (Week 3): Pt will consume current diet minimal overt s/sx aspiration and efficient mastication and oral clearance of solids with Supervision A multimodal cues for use of swallow strategies. SLP Short Term Goal 2 (Week 3): Pt will consume upgraded trials Dys 3 solids with efficient mastication and oral clearance X3 prior to advancement. SLP Short Term Goal 3 (Week 3): Pt will communicate at the word and phrase level with Min A cues for use of speech intelligibility strategies to achieve ~80% intelligibility. SLP Short Term Goal 4 (Week 3): Pt will demonstrate ability to recall new and daily information with Mod A multimodal cues for compensatory strategies. SLP Short Term Goal 5 (Week 3): Pt will demonstrate ability to problem solve during basic familiar tasks with Mod A verbal/visual cues. SLP Short Term Goal 6 (Week 3): Pt will scan left visual field and locate items on left side of environment in 90% opportunities with Min A multimodal cues.  Skilled Therapeutic Interventions: Pt was seen for skilled ST targeting speech goals. During a phrase level barrier speech task, pt was ~75% intelligible, and used increased vocal intensity with Min A. Despite Max A verbal cues and in depth education regarding facial weakness and speech production musculature, pt with minimal success responding to overarticulation and other more specific articulatory placement cues in a functional manner. Pt recited also family members' names (unknown to this therapist) with 2 verbal cues for clarification. Pt left laying in bed with alarm set and needs within reach. Continue per current plan of care.        Pain Pain Assessment Pain Scale:  0-10 Pain Score: 0-No pain  Therapy/Group: Individual Therapy  Little Ishikawa 11/20/2019, 12:05 PM

## 2019-11-20 NOTE — Progress Notes (Addendum)
Team Conference Report to Patient/Family  Team Conference discussion was reviewed with the patient's daughter and she will forward to grand-daughter, including goals, any changes in plan of care and target discharge date extended to 12/05/19.  Patient's daughter expressed understanding and is in agreement.  The patient has a target discharge date of 12/05/19. And daughter advised of need for family education 4/6 or 4/7. She will review this info with Anjail. The family is working on a ramp.  Chana Bode B 11/20/2019, 12:16 PM

## 2019-11-20 NOTE — Progress Notes (Signed)
[facility] PHYSICAL MEDICINE & REHABILITATION PROGRESS NOTE   Subjective/Complaints: No new issues overnight, no longer requiring intermittent catheterization  ROS-  Pt denines SOB, abd pain, CP, N/V//D, and vision changes  Objective: No results found. Recent Labs    11/18/19 0955 11/19/19 0005  WBC 6.0 5.7  HGB 9.7* 9.4*  HCT 30.6* 29.8*  PLT 335 304   Recent Labs    11/18/19 0955 11/19/19 0005  NA 137 139  K 3.8 3.6  CL 103 109  CO2 22 22  GLUCOSE 121* 88  BUN 21 21  CREATININE 1.51* 1.45*  CALCIUM 8.7* 8.5*    Intake/Output Summary (Last 24 hours) at 11/20/2019 1214 Last data filed at 11/20/2019 0827 Gross per 24 hour  Intake 310 ml  Output --  Net 310 ml     Physical Exam: Vital Signs Blood pressure (!) 176/46, pulse 62, temperature 98.4 F (36.9 C), temperature source Oral, resp. rate 18, height 5\' 6"  (1.676 m), weight 54.2 kg, SpO2 98 %.  General: No acute distress Mood and affect are appropriate Heart: Regular rate and rhythm no rubs murmurs or extra sounds Lungs: Clear to auscultation, breathing unlabored, no rales or wheezes Abdomen: Positive bowel sounds, soft nontender to palpation, nondistended Extremities: No clubbing, cyanosis, or edema Skin: No evidence of breakdown, no evidence of rash Oriented to person and place Speech dysarthric  Neurologic: Motor strength is 4/5 in RIght 0/5 left  deltoid, bicep, tricep, grip, 4/5 RIght , 3- Left hip flexor, knee extensors,4/5 RIght and tr/5 left  ankle dorsiflexor and plantar flexor Tone MAS 3-4 Left  Elbow extensors     Musculoskeletal:No joint swelling  Assessment/Plan: 1. Functional deficits secondary to Right MCA infarct  which require 3+ hours per day of interdisciplinary therapy in a comprehensive inpatient rehab setting.  Physiatrist is providing close team supervision and 24 hour management of active medical problems listed below.  Physiatrist and rehab team continue to assess barriers to  discharge/monitor patient progress toward functional and medical goals  Care Tool:  Bathing    Body parts bathed by patient: Left arm, Chest, Abdomen, Right upper leg, Left upper leg, Face, Front perineal area   Body parts bathed by helper: Right arm, Right lower leg, Left lower leg, Buttocks Body parts n/a: Right arm, Left arm, Chest, Abdomen, Buttocks, Right upper leg, Left upper leg, Left lower leg, Right lower leg   Bathing assist Assist Level: Maximal Assistance - Patient 24 - 49%     Upper Body Dressing/Undressing Upper body dressing   What is the patient wearing?: Pull over shirt    Upper body assist Assist Level: Maximal Assistance - Patient 25 - 49%    Lower Body Dressing/Undressing Lower body dressing      What is the patient wearing?: Incontinence brief, Pants     Lower body assist Assist for lower body dressing: 2 Helpers     Toileting Toileting    Toileting assist Assist for toileting: Dependent - Patient 0%     Transfers Chair/bed transfer  Transfers assist     Chair/bed transfer assist level: 2 Helpers     Locomotion Ambulation   Ambulation assist   Ambulation activity did not occur: Safety/medical concerns(poor balance/high fall risk)  Assist level: 2 helpers Assistive device: Walker-rolling Max distance: 31ft   Walk 10 feet activity   Assist  Walk 10 feet activity did not occur: Safety/medical concerns(poor balance/high fall risk)  Assist level: 2 helpers Assistive device: Walker-rolling   Walk 50  feet activity   Assist Walk 50 feet with 2 turns activity did not occur: Safety/medical concerns(poor balance/high fall risk)  Assist level: Moderate Assistance - Patient - 50 - 74%(mod A and +2 w/c follow) Assistive device: Walker-Eva    Walk 150 feet activity   Assist Walk 150 feet activity did not occur: Safety/medical concerns(poor balance/high fall risk)         Walk 10 feet on uneven surface  activity   Assist  Walk 10 feet on uneven surfaces activity did not occur: Safety/medical concerns(poor balance/high fall risk)         Wheelchair     Assist Will patient use wheelchair at discharge?: Yes Type of Wheelchair: Manual    Wheelchair assist level: Dependent - Patient 0% Max wheelchair distance: 150 ft    Wheelchair 50 feet with 2 turns activity    Assist        Assist Level: Dependent - Patient 0%   Wheelchair 150 feet activity     Assist      Assist Level: Dependent - Patient 0%   Blood pressure (!) 176/46, pulse 62, temperature 98.4 F (36.9 C), temperature source Oral, resp. rate 18, height 5\' 6"  (1.676 m), weight 54.2 kg, SpO2 98 %.   Medical Problem List and Plan: 1.  Left hemiparesis with sensory deficits, L-HH with right inattention, pusher tendencies to the left/back, severe dysarthria secondary to right MCA infarct.             -patient may shower             -ELOS/Goals: 4/2             Continue CIR PT, OT . SLP- reduce to 15/7,    2.  Antithrombotics: -DVT/anticoagulation:  Pharmaceutical: Lovenox             -antiplatelet therapy: ASA daily.  3. Pain Management: Tylenol prn. Well controlled.  4. Mood: LCSW to follow for evaluation and support.              -antipsychotic agents: N/A 5. Neuropsych: This patient is? capable of making decisions on her own behalf. 6. Skin/Wound Care: Routine pressure relief measure.  7. Fluids/Electrolytes/Nutrition: Monitor I/O.  IVF at nights for hydration.  8. HTN: Monitor BP tid--continue hydralazine and BB (also was on microzide in the past)  Vitals:   11/20/19 0425 11/20/19 0737  BP: (!) 161/53 (!) 176/46  Pulse: 64 62  Resp: 19 18  Temp: 98.1 F (36.7 C) 98.4 F (36.9 C)  SpO2: 97% 98%  Improved with hydralazine to 25mg  TID,now hypotensive will reduce to 10mg  , increase lopressor to 25 mg, HR improved BP normalized   9. Chronic diastolic CHF/Valvular disease: Monitor weights daily with I/O. Continue  hydralazine, metoprolol, pravastatin and ASA. No ARB/ACE due to CKD.              Daily weights Filed Weights   11/17/19 0259 11/18/19 0636 11/20/19 0425  Weight: 54.2 kg 53.2 kg 54.2 kg  weight down, low fluid intake , will encourage , IVF  10. Dyslipidemia: On pravastatin.  11. GERD: resume PPI.  12. Chronic constipation: Continue Senna.  Adjust bowel meds as necessary. 13. CKD III: Monitor renal status with serial checks.  CMP ordered. 14. GU: now voiding but will d/c terazosin due to low BP, monitor for retention  15.  Post stroke dysphagia- given  degree of dysarthria expect severe oral phase issues as well  Pure with honey thick liquids, advance diet as tolerated   Will discuss f/u MBS with SLP this week , oral phase may be main issue  16.  Bowel incont lkely multifactorial with reduced awareness, delay in notifying nurse and difficulty getting g on bedpan  17. Anemia: Hgb decreased to 9.4 from 11.8 one week ago. Had been stable in 11s prior to this. Ordered heme occult.No sample thus far  Recheck CBC stable , fecal OB neg , may be dilutional due to IVF 18.  Spasticity- avoid tizanidine due to hypotension, avoid baclofen due to CKD, may need BOtox as OP, Left triceps LOS: 15 days A FACE TO FACE EVALUATION WAS PERFORMED  Erick Colace 11/20/2019, 12:14 PM

## 2019-11-20 NOTE — Progress Notes (Addendum)
Occupational Therapy Weekly Progress Note  Patient Details  Name: Sarah Delacruz MRN: [id number] Date of Birth: June 18, 1924  Beginning of progress report period: November 12, 2019 End of progress report period: November 20, 2019  Today's Date: 11/20/2019 OT Individual Time: 6256-3893 OT Individual Time Calculation (min): 53 min   Patient has met 1 of 4 short term goals. Patient making slow progressing toward OT goals requiring close supervision A and Min cueing at best for static sitting balance and Mod A with Mod to Max cueing at best for dynamic sitting balance with functional tasks including LB dressing. Patient completes UB bathing with Mod A in unsupported sitting EOB and Max A for UB dressing with cueing for use of hemi-dressing techniques. Patient able to complete sit to stand transfers from EOB to RW with Mod A +2. Patient continues to be limited by increased LLE extensor tone, trunk extensor pattern, and increased tone in RUE < hand making functional transfers and ambulation a hardship. Patient also limited by left visual field deficits requiring Mod-Max cueing for locating items in L visual field on bedside table during UB bathing/dressing seated EOB or in TIS wc. Patient continues to benefit from skilled OT services to increase independence and decrease caregiver burden in prep for safe discharge to next level of care with anticipated d/c on 4/2.   Patient continues to demonstrate the following deficits: muscle weakness, impaired timing and sequencing, abnormal tone, unbalanced muscle activation, motor apraxia, decreased coordination and decreased motor planning, decreased midline orientation and decreased attention to left, decreased awareness, decreased problem solving, decreased memory and delayed processing and decreased sitting balance, decreased standing balance, decreased postural control, hemiplegia and decreased balance strategies and therefore will continue to benefit from skilled OT  intervention to enhance overall performance with BADL and Reduce care partner burden.  Patient progressing toward long term goals..  Continue plan of care.  OT Short Term Goals Week 2:  OT Short Term Goal 1 (Week 2): Patient will complete UB dressing with Mod A in supported sitting with no more than Mod multimodal cues. OT Short Term Goal 1 - Progress (Week 2): Progressing toward goal OT Short Term Goal 2 (Week 2): Patient will demonstrate dynamic sitting balance unsupported with Mod A in prep for UB bathing/dressing tasks. OT Short Term Goal 2 - Progress (Week 2): Progressing toward goal OT Short Term Goal 3 (Week 2): Patient will complete sit > stand transfers with Mod A +2 helpers. OT Short Term Goal 3 - Progress (Week 2): Met OT Short Term Goal 4 (Week 2): Pt will complete UB bathing in supported sitting with min assist. OT Short Term Goal 4 - Progress (Week 2): Progressing toward goal Week 3:  OT Short Term Goal 1 (Week 3): Patient will complete UB dressing with Mod A in supported sitting with no more than Mod multimodal cues. OT Short Term Goal 2 (Week 3): Patient will demonstrate dynamic sitting balance unsupported with Mod A in prep for UB bathing/dressing tasks. OT Short Term Goal 3 (Week 3): Pt will complete UB bathing in supported sitting with min assist. OT Short Term Goal 4 (Week 3): Patient will complete oral hygiene with Mod A seated at sink level with no more than 1-3 multimodal cues.  Skilled Therapeutic Interventions/Progress Updates:  Patient met lying supine in bed in agreement with OT treatment session. Patient recognized this therapist for treatment session yesterday. 0/10 pain noted at rest and with activity. Patient indicating need to void. Supine to EOB with  Mod A and cueing for use of RLE to help advance LLE toward EOB and for hand placement when bring trunk from lying to sitting. Lateral scooting to L seated EOB with Max A progressing to Mod A and Max cues for sequencing  and foot placement. Patient demonstrating strong pusher syndrome with functional transfers this date requiring Max cueing for forward flexion and L leg block secondary to extensor tone. Squat-pivot transfer EOB > BSC > TIS wc with Max A +2 progressing to Mod A +2 and cueing for forward lean, foot placement, and hand placement. Patient able to complete peri-care seated on [facility] with supervision A and cueing for posture. Patient engaged in UB/LB dressing seated in TIS wc with cueing and Mod A to don UB clothing with use of hemi-dressing techniques. Mod A +2 required for sit to stand to hike pants over hips with use of RW and Max cues for hemi-dressing techniques, hand placement, and sequencing. Patient left tilted in TIS wc with call bell within reach and all needs met.   Therapy Documentation Precautions:  Precautions Precautions: Fall, Other (comment) Precaution Comments: left inattention, extensor patterns in LEs and trunk Restrictions Weight Bearing Restrictions: No   Therapy/Group: Individual Therapy  Sarah Delacruz 11/20/2019, 7:55 AM

## 2019-11-20 NOTE — Progress Notes (Signed)
Speech Language Pathology Weekly Progress and Session Note  Patient Details  Name: Sarah Delacruz MRN: [id number] Date of Birth: May 09, 1924  Beginning of progress report period: November 14, 2019 End of progress report period: November 20, 2019  Today's Date: 11/20/2019 SLP Individual Time: 1130-1155 SLP Individual Time Calculation (min): 25 min  Short Term Goals: Week 2: SLP Short Term Goal 1 (Week 2): Pt will consume current diet minimal overt s/sx aspiration and efficient mastication and oral clearance of solids with Min A multimodal cues for use of swallow strategies. SLP Short Term Goal 1 - Progress (Week 2): Met SLP Short Term Goal 2 (Week 2): Pt will sustain attention to tasks for 30 minute intervals with Min A cues for redirection. SLP Short Term Goal 2 - Progress (Week 2): Met SLP Short Term Goal 3 (Week 2): Pt will demonstrate ability to recall new and daily information with Mod A multimodal cues for compensatory strategies. SLP Short Term Goal 3 - Progress (Week 2): Progressing toward goal SLP Short Term Goal 4 (Week 2): Pt will demonstrate ability to problem solve during basic familiar tasks with Max A verbal/visual cues. SLP Short Term Goal 4 - Progress (Week 2): Met SLP Short Term Goal 5 (Week 2): Pt will scan left visual field and locate items on left side of environment in 75% opportunities with Min A multimodal cues. SLP Short Term Goal 5 - Progress (Week 2): Progressing toward goal SLP Short Term Goal 6 (Week 2): Pt will communicate at the word and phrase level with Mod A cues for use of speech intelligibility strategies to achieve ~70% intelligibility. SLP Short Term Goal 6 - Progress (Week 2): Met    New Short Term Goals: Week 3: SLP Short Term Goal 1 (Week 3): Pt will consume current diet minimal overt s/sx aspiration and efficient mastication and oral clearance of solids with Supervision A multimodal cues for use of swallow strategies. SLP Short Term Goal 2 (Week 3): Pt  will consume upgraded trials Dys 3 solids with efficient mastication and oral clearance X3 prior to advancement. SLP Short Term Goal 3 (Week 3): Pt will communicate at the word and phrase level with Min A cues for use of speech intelligibility strategies to achieve ~80% intelligibility. SLP Short Term Goal 4 (Week 3): Pt will demonstrate ability to recall new and daily information with Mod A multimodal cues for compensatory strategies. SLP Short Term Goal 5 (Week 3): Pt will demonstrate ability to problem solve during basic familiar tasks with Mod A verbal/visual cues. SLP Short Term Goal 6 (Week 3): Pt will scan left visual field and locate items on left side of environment in 90% opportunities with Min A multimodal cues.  Weekly Progress Updates: Pt has made functional gains and met 4 out of 6 short term goals. Pt is currently Mod assist for basic cognitive tasks due to impairments impacting her sustained attention, problem solving, awareness, and visual scanning. Pt required Mod A for use of speech intelligibility due to dysarthria. She is consuming upgraded Dysphagia 2 (minced/ground) diet with thin liquids with ~Min A cues for use of swallow strategies. Pt has demonstrated improved sustained attention, use of increased vocal intensity for intelligibility, and oropharyngeal swallow function. Pt and family education is ongoing, but family would benefit from more targeted family education closer to d/c date. Pt would continue to benefit from skilled ST while inpatient in order to maximize functional independence and reduce burden of care prior to discharge. Anticipate that pt will need  24/7 supervision at discharge in addition to [place] follow up at next level of care.       Intensity: Minumum of 1-2 x/day, 30 to 90 minutes Frequency: 3 to 5 out of 7 days Duration/Length of Stay: 12/05/19 Treatment/Interventions: Cueing hierarchy;Cognitive remediation/compensation;Environmental controls;Internal/external  aids;Speech/Language facilitation;Functional tasks;Dysphagia/aspiration precaution training;Patient/family education;Therapeutic Activities   Daily Session  Skilled Therapeutic Interventions: Pt was seen for skilled ST targeting cognitive goals. Pt required overall Mod A verbal and visual cues for use of strategies for visual scanning during a functional medicine label reading task. Max A verbal and visual cues were required for problem solving to interpret/translate information within the labels during functional tasks, such as calculating total number of pills to take each day. Pt left sitting in wheelchair with alarm set and needs within reach. Continue per current plan of care.            Pain Pain Assessment Pain Scale: 0-10 Pain Score: 0-No pain  Therapy/Group: Individual Therapy  Arbutus Leas 11/20/2019, 7:12 AM

## 2019-11-21 ENCOUNTER — Inpatient Hospital Stay (HOSPITAL_COMMUNITY): Payer: Medicare Other | Admitting: Occupational Therapy

## 2019-11-21 ENCOUNTER — Inpatient Hospital Stay (HOSPITAL_COMMUNITY): Payer: Medicare Other | Admitting: Speech Pathology

## 2019-11-21 ENCOUNTER — Inpatient Hospital Stay (HOSPITAL_COMMUNITY): Payer: Medicare Other

## 2019-11-21 MED ORDER — POLYETHYLENE GLYCOL 3350 17 G PO PACK
17.0000 g | PACK | Freq: Once | ORAL | Status: AC
  Administered 2019-11-21: 17 g via ORAL
  Filled 2019-11-21: qty 1

## 2019-11-21 MED ORDER — GENERIC EXTERNAL MEDICATION
Status: DC
Start: ? — End: 2019-11-21

## 2019-11-21 MED ORDER — HYDRALAZINE HCL 10 MG PO TABS
10.0000 mg | ORAL_TABLET | Freq: Four times a day (QID) | ORAL | Status: DC
  Administered 2019-11-21 – 2019-11-23 (×9): 10 mg via ORAL
  Filled 2019-11-21 (×9): qty 1

## 2019-11-21 NOTE — Progress Notes (Signed)
Speech Language Pathology Daily Session Note  Patient Details  Name: Sarah Delacruz MRN: [id number] Date of Birth: 08-15-1924  Today's Date: 11/21/2019 SLP Individual Time: 1255-1340 SLP Individual Time Calculation (min): 45 min  Short Term Goals: Week 3: SLP Short Term Goal 1 (Week 3): Pt will consume current diet minimal overt s/sx aspiration and efficient mastication and oral clearance of solids with Supervision A multimodal cues for use of swallow strategies. SLP Short Term Goal 2 (Week 3): Pt will consume upgraded trials Dys 3 solids with efficient mastication and oral clearance X3 prior to advancement. SLP Short Term Goal 3 (Week 3): Pt will communicate at the word and phrase level with Min A cues for use of speech intelligibility strategies to achieve ~80% intelligibility. SLP Short Term Goal 4 (Week 3): Pt will demonstrate ability to recall new and daily information with Mod A multimodal cues for compensatory strategies. SLP Short Term Goal 5 (Week 3): Pt will demonstrate ability to problem solve during basic familiar tasks with Mod A verbal/visual cues. SLP Short Term Goal 6 (Week 3): Pt will scan left visual field and locate items on left side of environment in 90% opportunities with Min A multimodal cues.  Skilled Therapeutic Interventions: Skilled treatment session focused on dysphagia and cognitive goals. Upon arrival, patient was consuming her lunch meal of dys. 2 textures with thin liquids. Patient consumed meal without overt s/s of aspiration and intermittent supervision verbal cues for use of swallowing compensatory strategies. Recommend patient continue current diet. SLP also facilitated session by providing Mod A verbal cues for problem solving and visual scanning to her left field of environment during a 4-step picture sequencing task. At end of session, patient reported she was hot but declined water or removing her blanket. However, once in the room, patient told her daughter  she felt "faint." RN aware and PA aware. Patient handed off to RN. Continue with current plan of care.      Pain Pain Assessment Pain Scale: 0-10 Pain Score: 0-No pain  Therapy/Group: Individual Therapy  Myishia Kasik 11/21/2019, 3:35 PM

## 2019-11-21 NOTE — Progress Notes (Signed)
Pt was in room with daughter. VS were taken and noted as Pt stated she was "hot" during SPT session..Pt stated her stomach was not feeling well. All VS normal. Pt daughter wants to cancel her afternoon PT session. Pt was left resting in chair with daughter present. Will continue to monitor. Reuben Likes, LPN

## 2019-11-21 NOTE — Progress Notes (Signed)
Physical Therapy Session Note  Patient Details  Name: Sarah Delacruz MRN: [id number] Date of Birth: February 27, 1924  Today's Date: 11/21/2019 PT Individual Time: 7619-5093 PT Individual Time Calculation (min): 40 min   Short Term Goals: Week 3:  PT Short Term Goal 1 (Week 3): Pt will perform supine<>sit with min assist PT Short Term Goal 2 (Week 3): Pt will perform sit<>stand using LRAD with mod assist of 1 PT Short Term Goal 3 (Week 3): Pt will perform bed<>chair transfers using LRAD with mod assist of 1 person PT Short Term Goal 4 (Week 3): Pt will ambulate at least 96ft using LRAD with mod assist of 1 person  Skilled Therapeutic Interventions/Progress Updates:    Pt initially declining therapies despite various offered alternative interventions than leaving the room. RN was aware of pt reporting not feeling well. Pt agreeable and requesting to return to bed from TIS w/c. NMR to facilitate sit <> stands during session, stand step transfer to the L with RW, addressing postural control in standing and seated positions, and coordinated movement in LUE while in seated position. Pt requires mod assist to facilitate anterior weightshift for sit -> stand with cues for LLE planted on the floor and tactile input needed at times due to tone. Pt able to take several ambulatory steps to the L with the RW (and L hand orthosis - total assist for management of L hand on/off orthosis due to poor awareness and decreased strength/coordination/balance). Postural control retraining seated EOB while performing active ROM and coordination activities with LUE. Noted increased edema in this hand and educated daughter on positioning, elevation, and importance of encouragement of movement for edema control. While seated EOB requires supervision to occasional min tactile cues for improved posture. Pt wanting to change into gown for the afternoon. Performed mod assist for sit <> stand with posterior bias and decreased control of  LLE in stance. Total assist to doff pants and daughter assisted with donning of gown. Returned to supine with min assist and performed bridging for repositioning for coordinated BLE movement as well. Left with LUE elevated and daughter at bedside. She requests 4 rails up at this time. Educated this is a Product manager but noted on chart for patient's family's preference.    Therapy Documentation Precautions:  Precautions Precautions: Fall, Other (comment) Precaution Comments: left inattention, extensor patterns in LEs and trunk Restrictions Weight Bearing Restrictions: No Pain: Pt c/o feeling nauseous and sick. RN aware and medication was given.    Therapy/Group: Individual Therapy  Karolee Stamps Darrol Poke, PT, DPT, CBIS  11/21/2019, 3:01 PM

## 2019-11-21 NOTE — Progress Notes (Signed)
Occupational Therapy Session Note  Patient Details  Name: Sarah Delacruz MRN: [id number] Date of Birth: 04/01/1924  Today's Date: 11/21/2019 OT Individual Time: 6384-5364 OT Individual Time Calculation (min): 45 min    Short Term Goals: Week 3:  OT Short Term Goal 1 (Week 3): Patient will complete UB dressing with Mod A in supported sitting with no more than Mod multimodal cues. OT Short Term Goal 2 (Week 3): Patient will demonstrate dynamic sitting balance unsupported with Mod A in prep for UB bathing/dressing tasks. OT Short Term Goal 3 (Week 3): Pt will complete UB bathing in supported sitting with min assist. OT Short Term Goal 4 (Week 3): Patient will complete oral hygiene with Mod A seated at sink level with no more than 1-3 multimodal cues.  Skilled Therapeutic Interventions/Progress Updates:  Patient met lying supine in bed in agreement with skilled OT treatment session. Patient indicating need to void, completing stand-pivot transfer to [facility] with use of RW and Mod A +2. Toileting/hygiene/clothing management with Max A +2 and Max multimodal cues secondary to extensor tone in trunk this date. Stand-pivot transfer to TIS wc to R with total A. Patient completed UB dressing in supported sitting with Max A and Max multimodal cues for hemi-dressing techniques, L attention, and sequencing. LB dress with Mod A +2 to hike pants over hips in standing. Patient left seated in TIS wc with call bell within reach, belt alarm activated, and all needs met.   Therapy Documentation Precautions:  Precautions Precautions: Fall, Other (comment) Precaution Comments: left inattention, extensor patterns in LEs and trunk Restrictions Weight Bearing Restrictions: No  Therapy/Group: Individual Therapy  Katherinne Mofield R [place] 11/21/2019, 7:50 AM

## 2019-11-21 NOTE — Progress Notes (Signed)
[place] PHYSICAL MEDICINE & REHABILITATION PROGRESS NOTE   Subjective/Complaints:  No issues overnite , "I can't let go with Left hand "  ROS-  Pt denines SOB, abd pain, CP, N/V//D, and vision changes  Objective: No results found. Recent Labs    11/18/19 0955 11/19/19 0005  WBC 6.0 5.7  HGB 9.7* 9.4*  HCT 30.6* 29.8*  PLT 335 304   Recent Labs    11/18/19 0955 11/19/19 0005  NA 137 139  K 3.8 3.6  CL 103 109  CO2 22 22  GLUCOSE 121* 88  BUN 21 21  CREATININE 1.51* 1.45*  CALCIUM 8.7* 8.5*    Intake/Output Summary (Last 24 hours) at 11/21/2019 0913 Last data filed at 11/21/2019 0730 Gross per 24 hour  Intake 220 ml  Output --  Net 220 ml     Physical Exam: Vital Signs Blood pressure (!) 168/59, pulse 61, temperature 97.7 F (36.5 C), temperature source Oral, resp. rate 20, height 5\' 6"  (1.676 m), weight 52.6 kg, SpO2 99 %.   General: No acute distress Mood and affect are appropriate Heart: Regular rate and rhythm no rubs murmurs or extra sounds Lungs: Clear to auscultation, breathing unlabored, no rales or wheezes Abdomen: Positive bowel sounds, soft nontender to palpation, nondistended Extremities: No clubbing, cyanosis, or edema Skin: No evidence of breakdown, no evidence of rash   Speech dysarthric  Neurologic: Motor strength is 4/5 in RIght 0/5 left  deltoid, bicep, tricep, grip, 4/5 RIght , 3- Left hip flexor, knee extensors,4/5 RIght and tr/5 left  ankle dorsiflexor and plantar flexor Tone MAS 3-4 Left  Elbow extensors  Able to release grip Left hand with some effort Sensation intact ot LT    Musculoskeletal:No joint swelling  Assessment/Plan: 1. Functional deficits secondary to Right MCA infarct  which require 3+ hours per day of interdisciplinary therapy in a comprehensive inpatient rehab setting.  Physiatrist is providing close team supervision and 24 hour management of active medical problems listed below.  Physiatrist and rehab team  continue to assess barriers to discharge/monitor patient progress toward functional and medical goals  Care Tool:  Bathing    Body parts bathed by patient: Left arm, Chest, Abdomen, Right upper leg, Left upper leg, Face, Front perineal area   Body parts bathed by helper: Right arm, Right lower leg, Left lower leg, Buttocks Body parts n/a: Right arm, Left arm, Chest, Abdomen, Buttocks, Right upper leg, Left upper leg, Left lower leg, Right lower leg   Bathing assist Assist Level: Maximal Assistance - Patient 24 - 49%     Upper Body Dressing/Undressing Upper body dressing   What is the patient wearing?: Pull over shirt    Upper body assist Assist Level: Maximal Assistance - Patient 25 - 49%    Lower Body Dressing/Undressing Lower body dressing      What is the patient wearing?: Incontinence brief, Pants     Lower body assist Assist for lower body dressing: 2 Helpers     Toileting Toileting    Toileting assist Assist for toileting: Dependent - Patient 0%     Transfers Chair/bed transfer  Transfers assist     Chair/bed transfer assist level: 2 Helpers(mod A of 1 and min A of 2nd person)     Locomotion Ambulation   Ambulation assist   Ambulation activity did not occur: Safety/medical concerns(poor balance/high fall risk)  Assist level: 2 helpers(mod A of 1 and +2 w/c follow) Assistive device: Walker-rolling Max distance: ~59ft   Walk  10 feet activity   Assist  Walk 10 feet activity did not occur: Safety/medical concerns(poor balance/high fall risk)  Assist level: 2 helpers(mod A of 1 and +2 w/c follow) Assistive device: Walker-rolling   Walk 50 feet activity   Assist Walk 50 feet with 2 turns activity did not occur: Safety/medical concerns(poor balance/high fall risk)  Assist level: Moderate Assistance - Patient - 50 - 74%(mod A and +2 w/c follow) Assistive device: Walker-Eva    Walk 150 feet activity   Assist Walk 150 feet activity did not  occur: Safety/medical concerns(poor balance/high fall risk)         Walk 10 feet on uneven surface  activity   Assist Walk 10 feet on uneven surfaces activity did not occur: Safety/medical concerns(poor balance/high fall risk)         Wheelchair     Assist Will patient use wheelchair at discharge?: Yes Type of Wheelchair: Manual    Wheelchair assist level: Dependent - Patient 0% Max wheelchair distance: 150 ft    Wheelchair 50 feet with 2 turns activity    Assist        Assist Level: Dependent - Patient 0%   Wheelchair 150 feet activity     Assist      Assist Level: Dependent - Patient 0%   Blood pressure (!) 168/59, pulse 61, temperature 97.7 F (36.5 C), temperature source Oral, resp. rate 20, height 5\' 6"  (1.676 m), weight 52.6 kg, SpO2 99 %.   Medical Problem List and Plan: 1.  Left hemiparesis with sensory deficits, L-HH with right inattention, pusher tendencies to the left/back, severe dysarthria secondary to right MCA infarct.             -patient may shower             -ELOS/Goals: 4/2             Continue CIR PT, OT . SLP- reduce to 15/7,    2.  Antithrombotics: -DVT/anticoagulation:  Pharmaceutical: Lovenox             -antiplatelet therapy: ASA daily.  3. Pain Management: Tylenol prn. Well controlled.  4. Mood: LCSW to follow for evaluation and support.              -antipsychotic agents: N/A 5. Neuropsych: This patient is? capable of making decisions on her own behalf. 6. Skin/Wound Care: Routine pressure relief measure.  7. Fluids/Electrolytes/Nutrition: Monitor I/O.  IVF at nights for hydration.  8. HTN: Monitor BP tid--continue hydralazine and BB (also was on microzide in the past)  Vitals:   11/21/19 0443 11/21/19 0758  BP: (!) 182/55 (!) 168/59  Pulse: 61   Resp: 20   Temp: 97.7 F (36.5 C)   SpO2: 99%   Improved with hydralazine to 25mg  TID,now hypotensive will reduce to 10mg  , increase lopressor to 25 mg, HR improved  BP climbing up again increase hydralazine to 10mg  qid  9. Chronic diastolic CHF/Valvular disease: Monitor weights daily with I/O. Continue hydralazine, metoprolol, pravastatin and ASA. No ARB/ACE due to CKD.              Daily weights Filed Weights   11/18/19 0636 11/20/19 0425 11/21/19 0505  Weight: 53.2 kg 54.2 kg 52.6 kg  weight down, low fluid intake , will encourage , IVF at noc  10. Dyslipidemia: On pravastatin.  11. GERD: resume PPI.  12. Chronic constipation: Continue Senna.  Adjust bowel meds as necessary. 13. CKD III: Monitor renal status with  serial checks.  CMP ordered. 14. GU: now voiding but will d/c terazosin due to low BP, monitor for retention  15.  Post stroke dysphagia- given  degree of dysarthria expect severe oral phase issues as well              Pure with honey thick liquids, advance diet as tolerated   Will discuss f/u MBS with SLP this week , oral phase may be main issue  16.  Bowel incont lkely multifactorial with reduced awareness, delay in notifying nurse and difficulty getting g on bedpan  17. Anemia: Hgb decreased to 9.4 from 11.8 one week ago. Had been stable in 11s prior to this. Ordered heme occult.No sample thus far  Recheck CBC stable , fecal OB neg , may be dilutional due to IVF 18.  Spasticity- avoid tizanidine due to hypotension, avoid baclofen due to CKD, may need BOtox as OP, Left triceps LOS: 16 days A FACE TO FACE EVALUATION WAS PERFORMED  Erick Colace 11/21/2019, 9:13 AM

## 2019-11-22 ENCOUNTER — Inpatient Hospital Stay (HOSPITAL_COMMUNITY): Payer: Medicare Other | Admitting: Occupational Therapy

## 2019-11-22 ENCOUNTER — Inpatient Hospital Stay (HOSPITAL_COMMUNITY): Payer: Medicare Other | Admitting: Physical Therapy

## 2019-11-22 DIAGNOSIS — R0989 Other specified symptoms and signs involving the circulatory and respiratory systems: Secondary | ICD-10-CM

## 2019-11-22 LAB — GLUCOSE, CAPILLARY
Glucose-Capillary: 86 mg/dL (ref 70–99)
Glucose-Capillary: 85 mg/dL (ref 70–99)

## 2019-11-22 LAB — OCCULT BLOOD X 1 CARD TO LAB, STOOL: Fecal Occult Bld: NEGATIVE

## 2019-11-22 NOTE — Progress Notes (Signed)
Occupational Therapy Session Note  Patient Details  Name: Sarah Delacruz MRN: [id number] Date of Birth: 1924-03-27  Today's Date: 11/22/2019 OT Individual Time: 0802-0901 OT Individual Time Calculation (min): 59 min    Short Term Goals: Week 3:  OT Short Term Goal 1 (Week 3): Patient will complete UB dressing with Mod A in supported sitting with no more than Mod multimodal cues. OT Short Term Goal 2 (Week 3): Patient will demonstrate dynamic sitting balance unsupported with Mod A in prep for UB bathing/dressing tasks. OT Short Term Goal 3 (Week 3): Pt will complete UB bathing in supported sitting with min assist. OT Short Term Goal 4 (Week 3): Patient will complete oral hygiene with Mod A seated at sink level with no more than 1-3 multimodal cues.  Skilled Therapeutic Interventions/Progress Updates:    Pt completed toilet transfer to the 3:1 at bedside to start session, stand pivot to the right with total assist +2 (pt 30%).  Pt with increased pusher tendencies to the left in standing with resistance to therapist correcting weightshift to midline.  She needed +2 assist for managing brief as well once she had turned to the toilet.  She was able to complete toilet hygiene sit to stand with total assist +2 (pt 35%) with use of the RW in front for support with left hand splint attached.  She performed transfer stand pivot with the RW to the tilt in space wheelchair with max assist to work on bathing tasks.  She was able to wash her UB with overall min assist and max hand over hand for integration of the LUE to wash the right arm.  LB bathing was at max assist level sit to stand as was dressing tasks.  Pt still demonstrates decreased ability to reach her LEs for donning clothing.  She needed max assist for donning pullover shirt with use of hemi dressing techniques.  Pt left in the wheelchair with call button and phone in reach and safety alarm belt in place.    Therapy Documentation Precautions:   Precautions Precautions: Fall, Other (comment) Precaution Comments: left inattention, extensor patterns in LEs and trunk Restrictions Weight Bearing Restrictions: No  Pain:  No report of pain during session  ADL: See Care Tool Section for some details of mobility and selfcare  Therapy/Group: Individual Therapy  Awais Cobarrubias OTR/L 11/22/2019, 12:31 PM

## 2019-11-22 NOTE — Progress Notes (Signed)
[place] PHYSICAL MEDICINE & REHABILITATION PROGRESS NOTE   Subjective/Complaints: Patient seen sitting up in bed this morning.  She states she slept well overnight.  She asks for bowel meds.  Discussed with nursing the patient just received MiraLAX.  ROS: Denies abdominal pain, CP, SOB, N/V/D  Objective: No results found. No results for input(s): WBC, HGB, HCT, PLT in the last 72 hours. No results for input(s): NA, K, CL, CO2, GLUCOSE, BUN, CREATININE, CALCIUM in the last 72 hours.  Intake/Output Summary (Last 24 hours) at 11/22/2019 1810 Last data filed at 11/22/2019 1320 Gross per 24 hour  Intake 480 ml  Output --  Net 480 ml     Physical Exam: Vital Signs Blood pressure (!) 164/52, pulse (!) 58, temperature 98.1 F (36.7 C), temperature source Oral, resp. rate 16, height 5\' 6"  (1.676 m), weight 51.7 kg, SpO2 100 %. Constitutional: No distress . Vital signs reviewed. HENT: Normocephalic.  Atraumatic. Eyes: EOMI. No discharge. Cardiovascular: No JVD. Respiratory: Normal effort.  No stridor. GI: Non-distended.  Soft. Skin: Warm and dry.  Intact. Psych: Normal mood.  Normal behavior. Musc: No edema in extremities.  No tenderness in extremities. Neuro: Alert Dysarthria  Assessment/Plan: 1. Functional deficits secondary to Right MCA infarct  which require 3+ hours per day of interdisciplinary therapy in a comprehensive inpatient rehab setting.  Physiatrist is providing close team supervision and 24 hour management of active medical problems listed below.  Physiatrist and rehab team continue to assess barriers to discharge/monitor patient progress toward functional and medical goals  Care Tool:  Bathing    Body parts bathed by patient: Chest, Abdomen, Left arm, Right upper leg, Left upper leg   Body parts bathed by helper: Front perineal area, Buttocks, Face Body parts n/a: Right arm, Left arm, Chest, Abdomen, Buttocks, Right upper leg, Left upper leg, Left lower leg,  Right lower leg   Bathing assist Assist Level: Maximal Assistance - Patient 24 - 49%     Upper Body Dressing/Undressing Upper body dressing   What is the patient wearing?: Pull over shirt    Upper body assist Assist Level: Maximal Assistance - Patient 25 - 49%    Lower Body Dressing/Undressing Lower body dressing      What is the patient wearing?: Incontinence brief, Pants     Lower body assist Assist for lower body dressing: 2 Helpers     Toileting Toileting    Toileting assist Assist for toileting: 2 Helpers     Transfers Chair/bed transfer  Transfers assist     Chair/bed transfer assist level: Moderate Assistance - Patient 50 - 74% Chair/bed transfer assistive device: Walker, Armrests   Locomotion Ambulation   Ambulation assist   Ambulation activity did not occur: Safety/medical concerns(poor balance/high fall risk)  Assist level: Moderate Assistance - Patient 50 - 74% Assistive device: Walker-rolling Max distance: 71ft   Walk 10 feet activity   Assist  Walk 10 feet activity did not occur: Safety/medical concerns(poor balance/high fall risk)  Assist level: Moderate Assistance - Patient - 50 - 74% Assistive device: Walker-rolling   Walk 50 feet activity   Assist Walk 50 feet with 2 turns activity did not occur: Safety/medical concerns(poor balance/high fall risk)  Assist level: Moderate Assistance - Patient - 50 - 74%(mod A and +2 w/c follow) Assistive device: Walker-Eva    Walk 150 feet activity   Assist Walk 150 feet activity did not occur: Safety/medical concerns(poor balance/high fall risk)         Walk  10 feet on uneven surface  activity   Assist Walk 10 feet on uneven surfaces activity did not occur: Safety/medical concerns(poor balance/high fall risk)         Wheelchair     Assist Will patient use wheelchair at discharge?: Yes Type of Wheelchair: Manual    Wheelchair assist level: Dependent - Patient 0% Max  wheelchair distance: 150 ft    Wheelchair 50 feet with 2 turns activity    Assist        Assist Level: Dependent - Patient 0%   Wheelchair 150 feet activity     Assist      Assist Level: Dependent - Patient 0%   Blood pressure (!) 164/52, pulse (!) 58, temperature 98.1 F (36.7 C), temperature source Oral, resp. rate 16, height 5\' 6"  (1.676 m), weight 51.7 kg, SpO2 100 %.   Medical Problem List and Plan: 1.  Left hemiparesis with sensory deficits, L-HH with right inattention, pusher tendencies to the left/back, severe dysarthria secondary to right MCA infarct.  Continue CIR 2.  Antithrombotics: -DVT/anticoagulation:  Pharmaceutical: Lovenox             -antiplatelet therapy: ASA daily.  3. Pain Management: Tylenol prn.  4. Mood: LCSW to follow for evaluation and support.              -antipsychotic agents: N/A 5. Neuropsych: This patient is? capable of making decisions on her own behalf. 6. Skin/Wound Care: Routine pressure relief measure.  7. Fluids/Electrolytes/Nutrition: Monitor I/Os.   8. HTN: Monitor BP tid--continue hydralazine and BB (also was on microzide in the past)  Vitals:   11/22/19 1218 11/22/19 1717  BP: (!) 171/61 (!) 164/52  Pulse: (!) 58   Resp: 16   Temp: 98.1 F (36.7 C)   SpO2: 100%    Hydralazine 10mg  4 times daily  Increased lopressor to 25 mg,   Labile and elevated on 3/27, monitor for trend and consider further medication adjustments if necessary 9. Chronic diastolic CHF/Valvular disease: Monitor weights daily with I/Os. Continue hydralazine, metoprolol, pravastatin and ASA. No ARB/ACE due to CKD.              Daily weights Filed Weights   11/20/19 0425 11/21/19 0505 11/22/19 0423  Weight: 54.2 kg 52.6 kg 51.7 kg   Trending down on 3/27 10. Dyslipidemia: On pravastatin.  11. GERD: resume PPI.  12. Chronic constipation: Continue Senna.  Adjust bowel meds as necessary. 13. CKD III: Monitor renal status with serial checks.     Creatinine 1.45 on 3/24  Continue to monitor 14. GU: now voiding but will d/c terazosin due to low BP, monitor for retention   Voiding 15.  Post stroke dysphagia- given               D2 thins, advance diet as tolerated 16.  Bowel incont lkely multifactorial with reduced awareness, delay in notifying nurse and difficulty getting g on bedpan   ?  Improving 17. Anemia:   Hemoglobin 9.4 on 3/24   Fecal OB neg , may be dilutional due to IVF 18.  Spasticity- avoid tizanidine due to hypotension, avoid baclofen due to CKD, may need BOtox as OP, Left triceps  LOS: 17 days A FACE TO FACE EVALUATION WAS PERFORMED  Arsalan Brisbin 4/24 11/22/2019, 6:10 PM

## 2019-11-22 NOTE — Progress Notes (Signed)
Physical Therapy Session Note  Patient Details  Name: Sarah Delacruz MRN: [id number] Date of Birth: [birth year]  Today's Date: 11/22/2019 PT Individual Time: 3825-0539 PT Individual Time Calculation (min): 60 min   Short Term Goals: Week 3:  PT Short Term Goal 1 (Week 3): Pt will perform supine<>sit with min assist PT Short Term Goal 2 (Week 3): Pt will perform sit<>stand using LRAD with mod assist of 1 PT Short Term Goal 3 (Week 3): Pt will perform bed<>chair transfers using LRAD with mod assist of 1 person PT Short Term Goal 4 (Week 3): Pt will ambulate at least 51ft using LRAD with mod assist of 1 person  Skilled Therapeutic Interventions/Progress Updates:   Pt received sitting in TIS w/c and agreeable to therapy session. Pt reports she is going to miss everyone here when she finally goes home. Transport to therapy gym in w/c and when initiating scooting forward to transfer to EOM pt states "I think I need to make a stinky." Transported back to room. Sit>stand w/c>RW with mod assist for lifting into standing due to posterior lean and LE extensor tone (pts feet stay better on ground while wearing shoes for increased grip on floor) - max multimodal cuing and hand-over-hand assist for L hand placement on RW orthotic as pt demonstrates grasp reflex and unable to let go of w/c armrest due to poor motor planning and L inattention. Left stand pivot to [facility] using RW with mod assist of 1 for balance and AD management (+2 for safety) with cuing for sequencing - required total assist for LB clothing management. Continent of bladder - standing using RW with min/mod assist for balance while +2 performed total assist peri-care and LB clothing management. Performed block practice stand pivot transfers w/c<>EOB using RW with focus on sequencing, motor planning, AD management, and balance with min/mod assist - cuing for sequencing of scooting forward, placing L hand on RW orthotic, and then pushing up with R hand and  leaning forward to come to stand - continues to require assist for RW management as pt unable to manipulate AD due to L UE tone/inattention/paresis. Gait training ~31ft using RW with min assist for balance and mod assist for AD management especially when turning to sit on EOB with multimodal cuing for sequencing and pt continuing to have difficulty managing AD due to L UE impairments. Sit>supine with mod assist for trunk descent and B LE management. Pt left supine in bed with needs in reach, HOB elevated, L UE elevated on pillows due to edema, and bed alarm on.    Therapy Documentation Precautions:  Precautions Precautions: Fall, Other (comment) Precaution Comments: left inattention, extensor patterns in LEs and trunk Restrictions Weight Bearing Restrictions: No  Pain: No reports of pain throughout session.   Therapy/Group: Individual Therapy  Ginny Forth, PT, DPT 11/22/2019, 12:25 PM

## 2019-11-23 ENCOUNTER — Inpatient Hospital Stay (HOSPITAL_COMMUNITY): Payer: Medicare Other | Admitting: Occupational Therapy

## 2019-11-23 ENCOUNTER — Inpatient Hospital Stay (HOSPITAL_COMMUNITY): Payer: Medicare Other

## 2019-11-23 MED ORDER — HYDRALAZINE HCL 25 MG PO TABS
25.0000 mg | ORAL_TABLET | Freq: Four times a day (QID) | ORAL | Status: DC
  Administered 2019-11-23 – 2019-12-02 (×34): 25 mg via ORAL
  Filled 2019-11-23 (×35): qty 1

## 2019-11-23 NOTE — Progress Notes (Signed)
Occupational Therapy Session Note  Patient Details  Name: Sarah Delacruz MRN: [id number] Date of Birth: [birth year]  Today's Date: 11/23/2019 OT Individual Time: 1400-1453 OT Individual Time Calculation (min): 53 min    Short Term Goals: Week 3:  OT Short Term Goal 1 (Week 3): Patient will complete UB dressing with Mod A in supported sitting with no more than Mod multimodal cues. OT Short Term Goal 2 (Week 3): Patient will demonstrate dynamic sitting balance unsupported with Mod A in prep for UB bathing/dressing tasks. OT Short Term Goal 3 (Week 3): Pt will complete UB bathing in supported sitting with min assist. OT Short Term Goal 4 (Week 3): Patient will complete oral hygiene with Mod A seated at sink level with no more than 1-3 multimodal cues.  Skilled Therapeutic Interventions/Progress Updates:    Upon entering the room, pt supine in bed with daughter present in the room. Pt appears very fatigued and RN reports she recently returned to bed from previous therapy sessions and sitting up in chair for several hours. BP results while supine are 153/50 and resting HR of 65 bpm. Pt is agreeable to OT intervention but requests to remain in bed this session. Pt scanning L of room to obtain water from therapist on the L with increased time to locate. Min cuing for safety to take smaller sips. Pt given 1 lbs resistive dowel rod for B UE strengthening and coordination. Pt needing hand over hand assistance to initially start movement and to have pt attend to L UE during task. Pt performing chest presses, straight arm raises, and forward rows 3 sets of 10 with rest breaks as needed. Daughter requesting assist to change pt into gown to sleep in. OT removed B TED hose total A. Pt threading L UE into gown with hand over hand assistance to pull over L UE. Pt getting R UE into shirt and initiating getting it to head but needing assistance to get it over head. Pt rolling L <> R with mod A to pull down trunk for  comfort. Pt repositioned and call bell placed within reach. Bed alarm activated. Daughter remained in room.   Therapy Documentation Precautions:  Precautions Precautions: Fall, Other (comment) Precaution Comments: left inattention, extensor patterns in LEs and trunk Restrictions Weight Bearing Restrictions: No General:   Vital Signs: Therapy Vitals Temp: 98.7 F (37.1 C) Pulse Rate: 62 Resp: 18 BP: (!) 147/54 Oxygen Therapy SpO2: 100 % O2 Device: Room Air Pain:   ADL: ADL Grooming: Dependent(Honey thick liquids) Upper Body Bathing: Maximal cueing, Maximal assistance(Multimodal cueing for focused attention and sustained attention.) Where Assessed-Upper Body Bathing: Edge of bed Lower Body Bathing: Maximal cueing, Dependent(+2 helpers) Where Assessed-Lower Body Bathing: Edge of bed Upper Body Dressing: Maximal cueing, Maximal assistance Where Assessed-Upper Body Dressing: Edge of bed Lower Body Dressing: Maximal cueing, Dependent(+2 helpers) Where Assessed-Lower Body Dressing: Edge of bed Toileting: Dependent(Incontinent brief) Toilet Transfer: Unable to assess Toilet Transfer Method: Unable to assess Tub/Shower Transfer: Unable to assess ADL Comments: Patient with poor static/dynamic sitting balance, L inattention/pusher syndrome, and need for maximal cueing for focused and sustained attention. Vision   Perception    Praxis   Exercises:   Other Treatments:     Therapy/Group: Individual Therapy  Alen Bleacher 11/23/2019, 2:54 PM

## 2019-11-23 NOTE — Progress Notes (Signed)
Physical Therapy Session Note  Patient Details  Name: Sarah Delacruz MRN: [id number] Date of Birth: Nov 08, 1923  Today's Date: 11/23/2019 PT Individual Time: 0902-0959 PT Individual Time Calculation (min): 57 min   Short Term Goals: Week 3:  PT Short Term Goal 1 (Week 3): Pt will perform supine<>sit with min assist PT Short Term Goal 2 (Week 3): Pt will perform sit<>stand using LRAD with mod assist of 1 PT Short Term Goal 3 (Week 3): Pt will perform bed<>chair transfers using LRAD with mod assist of 1 person PT Short Term Goal 4 (Week 3): Pt will ambulate at least 32ft using LRAD with mod assist of 1 person  Skilled Therapeutic Interventions/Progress Updates:    Patient supine in bed upon PT arrival, agreeable to therapy tx, denies pain. Therapist donned B TEDs and grippy socks totalA. Supine > sitting EOB with minA for trunk upright and LE management, cues to push up through LUE and utilize RUE to assist trunk upright. Pt performed fwd scooting with minA to prevent significant posterior leaning, cueing for technique and additional time required. Sit > stand to RW with modA d/t posterior lean and max multimodal cueing for sequencing. Stand pivot transfer > BSC with modA, therapist performed clothing management totalA. Stand > sit with minA for positioning and cues for sequencing. Pt continent of bladder and performed pericare without assist. Therapist donned new brief, pants, and shoes totalA.  Sit > stand from [facility] with modA +2 with second helper performing clothing management and max cueing for sequencing. Stand > sitting in w/c with minA. Pt transported to rehab gym for time management and energy conservation. Pt performed blocked practice of fwd/retro scooting in w/c with supervision and cueing for sequencing and additional time required. Pt then performed stand pivot transfer from w/c <> mat x 3 reps with modA progressing to minA with additional reps and max cueing for sequencing, posture and RW  management. Sit > stand from mat to RW with minA . Pt ambulated 30' with minA for balance and modA for AD management. Stand > sitting in w/c with minA. Pt transported back to room in w/c, left seated in w/c with needs in reach and chair alarm set.    Therapy Documentation Precautions:  Precautions Precautions: Fall, Other (comment) Precaution Comments: left inattention, extensor patterns in LEs and trunk Restrictions Weight Bearing Restrictions: No   Therapy/Group: Individual Therapy  Blima Ledger SPT 11/23/2019, 7:18 AM

## 2019-11-23 NOTE — Progress Notes (Signed)
[place] PHYSICAL MEDICINE & REHABILITATION PROGRESS NOTE   Subjective/Complaints: Patient seen sitting up in bed this morning.  She states she slept well overnight.  She states she had a bowel movement yesterday.  ROS: Denies abdominal pain, CP, SOB, N/V/D  Objective: No results found. No results for input(s): WBC, HGB, HCT, PLT in the last 72 hours. No results for input(s): NA, K, CL, CO2, GLUCOSE, BUN, CREATININE, CALCIUM in the last 72 hours.  Intake/Output Summary (Last 24 hours) at 11/23/2019 1314 Last data filed at 11/23/2019 0750 Gross per 24 hour  Intake 420 ml  Output 650 ml  Net -230 ml     Physical Exam: Vital Signs Blood pressure (!) 165/52, pulse 67, temperature 98.3 F (36.8 C), resp. rate 19, height 5\' 6"  (1.676 m), weight 49 kg, SpO2 99 %.  Constitutional: No distress . Vital signs reviewed. HENT: Normocephalic.  Atraumatic. Eyes: EOMI. No discharge. Cardiovascular: No JVD. Respiratory: Normal effort.  No stridor. GI: Non-distended. Skin: Warm and dry.  Intact. Psych: Normal mood.  Normal behavior. Musc: No edema in extremities.  No tenderness in extremities. Neuro: Alert Dysarthria Motor: RUE/RLE: 4/5 proximal distal LUE: 3/5 proximal distal LLE: 2+/5 proximal to distal  Assessment/Plan: 1. Functional deficits secondary to Right MCA infarct  which require 3+ hours per day of interdisciplinary therapy in a comprehensive inpatient rehab setting.  Physiatrist is providing close team supervision and 24 hour management of active medical problems listed below.  Physiatrist and rehab team continue to assess barriers to discharge/monitor patient progress toward functional and medical goals  Care Tool:  Bathing    Body parts bathed by patient: Chest, Abdomen, Left arm, Right upper leg, Left upper leg   Body parts bathed by helper: Front perineal area, Buttocks, Face Body parts n/a: Right arm, Left arm, Chest, Abdomen, Buttocks, Right upper leg, Left  upper leg, Left lower leg, Right lower leg   Bathing assist Assist Level: Maximal Assistance - Patient 24 - 49%     Upper Body Dressing/Undressing Upper body dressing   What is the patient wearing?: Pull over shirt    Upper body assist Assist Level: Maximal Assistance - Patient 25 - 49%    Lower Body Dressing/Undressing Lower body dressing      What is the patient wearing?: Incontinence brief, Pants     Lower body assist Assist for lower body dressing: 2 Helpers     Toileting Toileting    Toileting assist Assist for toileting: 2 Helpers     Transfers Chair/bed transfer  Transfers assist     Chair/bed transfer assist level: Moderate Assistance - Patient 50 - 74% Chair/bed transfer assistive device: Walker, Armrests   Locomotion Ambulation   Ambulation assist   Ambulation activity did not occur: Safety/medical concerns(poor balance/high fall risk)  Assist level: Moderate Assistance - Patient 50 - 74% Assistive device: Walker-rolling Max distance: 30 ft   Walk 10 feet activity   Assist  Walk 10 feet activity did not occur: Safety/medical concerns(poor balance/high fall risk)  Assist level: Moderate Assistance - Patient - 50 - 74% Assistive device: Walker-rolling   Walk 50 feet activity   Assist Walk 50 feet with 2 turns activity did not occur: Safety/medical concerns(poor balance/high fall risk)  Assist level: Moderate Assistance - Patient - 50 - 74%(mod A and +2 w/c follow) Assistive device: Walker-Eva    Walk 150 feet activity   Assist Walk 150 feet activity did not occur: Safety/medical concerns(poor balance/high fall risk)  Walk 10 feet on uneven surface  activity   Assist Walk 10 feet on uneven surfaces activity did not occur: Safety/medical concerns(poor balance/high fall risk)         Wheelchair     Assist Will patient use wheelchair at discharge?: Yes Type of Wheelchair: Manual    Wheelchair assist level:  Dependent - Patient 0% Max wheelchair distance: 150 ft    Wheelchair 50 feet with 2 turns activity    Assist        Assist Level: Dependent - Patient 0%   Wheelchair 150 feet activity     Assist      Assist Level: Dependent - Patient 0%   Blood pressure (!) 165/52, pulse 67, temperature 98.3 F (36.8 C), resp. rate 19, height 5\' 6"  (1.676 m), weight 49 kg, SpO2 99 %.   Medical Problem List and Plan: 1.  Left hemiparesis with sensory deficits, L-HH with right inattention, pusher tendencies to the left/back, severe dysarthria secondary to right MCA infarct.  Continue CIR 2.  Antithrombotics: -DVT/anticoagulation:  Pharmaceutical: Lovenox             -antiplatelet therapy: ASA daily.  3. Pain Management: Tylenol prn.  4. Mood: LCSW to follow for evaluation and support.              -antipsychotic agents: N/A 5. Neuropsych: This patient is? capable of making decisions on her own behalf. 6. Skin/Wound Care: Routine pressure relief measure.  7. Fluids/Electrolytes/Nutrition: Monitor I/Os.   8. HTN: Monitor BP tid--continue hydralazine and BB (also was on microzide in the past)  Vitals:   11/23/19 1009 11/23/19 1237  BP: (!) 151/60 (!) 165/52  Pulse: 67   Resp:    Temp:    SpO2:     Hydralazine 10mg  4 times daily, increased to 25 on 3/28  Increased lopressor to 25 mg,  9. Chronic diastolic CHF/Valvular disease: Monitor weights daily with I/Os. Continue hydralazine, metoprolol, pravastatin and ASA. No ARB/ACE due to CKD.              Daily weights Filed Weights   11/21/19 0505 11/22/19 0423 11/23/19 0419  Weight: 52.6 kg 51.7 kg 49 kg   Continues to trend down on 3/28 10. Dyslipidemia: On pravastatin.  11. GERD: resume PPI.  12. Chronic constipation: Continue Senna.  Adjust bowel meds as necessary. 13. CKD III: Monitor renal status with serial checks.    Creatinine 1.45 on 3/28  Continue to monitor 14. GU: now voiding but DC'd terazosin due to low BP, monitor  for retention   Voiding 15.  Post stroke dysphagia- given               D2 thins, advance diet as tolerated 16.  Bowel incont lkely multifactorial with reduced awareness, delay in notifying nurse and difficulty getting g on bedpan   Improving 17. Anemia:   Hemoglobin 9.4 on 3/24, labs ordered for tomorrow  Fecal OB neg , may be dilutional due to IVF 18.  Spasticity- avoid tizanidine due to hypotension, avoid baclofen due to CKD, may need BOtox as OP, Left triceps  LOS: 18 days A FACE TO FACE EVALUATION WAS PERFORMED  Sarah Delacruz 4/28 11/23/2019, 1:14 PM

## 2019-11-24 ENCOUNTER — Inpatient Hospital Stay (HOSPITAL_COMMUNITY): Payer: Medicare Other

## 2019-11-24 ENCOUNTER — Inpatient Hospital Stay (HOSPITAL_COMMUNITY): Payer: Medicare Other | Admitting: Speech Pathology

## 2019-11-24 LAB — CBC
HCT: 31.2 % — ABNORMAL LOW (ref 36.0–46.0)
Hemoglobin: 9.7 g/dL — ABNORMAL LOW (ref 12.0–15.0)
MCH: 28.9 pg (ref 26.0–34.0)
MCHC: 31.1 g/dL (ref 30.0–36.0)
MCV: 92.9 fL (ref 80.0–100.0)
Platelets: 348 10*3/uL (ref 150–400)
RBC: 3.36 MIL/uL — ABNORMAL LOW (ref 3.87–5.11)
RDW: 14.2 % (ref 11.5–15.5)
WBC: 5.9 10*3/uL (ref 4.0–10.5)
nRBC: 0 % (ref 0.0–0.2)

## 2019-11-24 NOTE — Progress Notes (Signed)
Physical Therapy Session Note  Patient Details  Name: Sarah Delacruz MRN: [id number] Date of Birth: [birth year]  Today's Date: 11/24/2019 PT Individual Time: 0915-1000 PT Individual Time Calculation (min): 45 min   Short Term Goals: Week 3:  PT Short Term Goal 1 (Week 3): Pt will perform supine<>sit with min assist PT Short Term Goal 2 (Week 3): Pt will perform sit<>stand using LRAD with mod assist of 1 PT Short Term Goal 3 (Week 3): Pt will perform bed<>chair transfers using LRAD with mod assist of 1 person PT Short Term Goal 4 (Week 3): Pt will ambulate at least 70ft using LRAD with mod assist of 1 person  Skilled Therapeutic Interventions/Progress Updates:    Patient supine in bed upon PT arrival, agreeable to therapy tx, denies pain. Therapist retrieved standard manual w/c for pt. Therapist donned B TEDs, socks, and shoes totalA. Supine > sitting EOB with minA for trunk upright, cues for LE management off the bed. Pt performed forward scooting to come to EOB with min-modA from therapist to move hips. Sit > stand with RW (multiple attempts) > stand pivot transfer to w/c with modA + 2 for safety and RW management, pt demonstrated significant posterior leaning. Pt transported to rehab gym in w.c for time management and energy conservation. Sit > stand from w/c to RW with minA max cueing for sequencing, technique, and anterior trunk lean prior to stand. Stand pivot transfer > mat with RW and modA. Pt performed blocked practice sit <> stands from mat table to RW with min-modA and max cueing for sequencing, scooting forward and anterior trunk lean prior to stand. Therapist utilized tband resistance as feedback for pt to lean forward while sitting attempted carry over with sit <> stand however pt unable to maintain anterior trunk lean position. Pt required less assistance when using cues to "reach to your shoes" and stand from excessive anterior trunk lean position.  Stand pivot transfer with RW > w/c  with minA for safety and RW management with max multimodal cueing for sequencing and anterior trunk lean. Pt transported back to room at end of session. Pt left seated in w/c with needs in reach and chair alarm set.   Therapy Documentation Precautions:  Precautions Precautions: Fall, Other (comment) Precaution Comments: left inattention, extensor patterns in LEs and trunk Restrictions Weight Bearing Restrictions: No   Therapy/Group: Individual Therapy  Blima Ledger SPT 11/24/2019, 7:33 AM

## 2019-11-24 NOTE — Progress Notes (Signed)
Occupational Therapy Session Note  Patient Details  Name: Sarah Delacruz MRN: [id number] Date of Birth: Jan 13, 1924  Today's Date: 11/24/2019 OT Individual Time: 4098-1191 OT Individual Time Calculation (min): 48 min   OT Individual Time: 1300-1330 OT Individual Time Calculation (min): 30 min   Short Term Goals: Week 3:  OT Short Term Goal 1 (Week 3): Patient will complete UB dressing with Mod A in supported sitting with no more than Mod multimodal cues. OT Short Term Goal 2 (Week 3): Patient will demonstrate dynamic sitting balance unsupported with Mod A in prep for UB bathing/dressing tasks. OT Short Term Goal 3 (Week 3): Pt will complete UB bathing in supported sitting with min assist. OT Short Term Goal 4 (Week 3): Patient will complete oral hygiene with Mod A seated at sink level with no more than 1-3 multimodal cues.  Skilled Therapeutic Interventions/Progress Updates:  Session 1: Patient met seated on commode with NT present. Patient in agreement with skilled OT treatment session with focus on self-care re-education and NMR as detailed below. Patient continent of bowel and bladder this date. Sit to stand from [facility] for hygiene management with Mod A and Max cueing for hand placement, foot placement, and midline orientation with patient demonstrating strong posterior lean. Stand-pivot transfer from [facility] to wc on R with Mod A and use of RW requiring Max cueing for sequencing. Patient engaged in LB dressing in supported sitting with +2 helpers to hike pants over hips in standing with use of RW. Patient demonstrates improved dynamic sitting balance requiring Max cueing and Min A to maintain. UB dressing with Mod A and Max cueing for sequencing. Patient with increased attention to L side requiring fewer cues to attend to L during dressing tasks. Patient with increased extensor tone in LUE limiting function. Patient demonstrates shoulder activation as well as flexion/extension of digits with Max cueing  and hand over hand assist. Session concluded with patient seated in wc with belt alarm activated, call bell within reach and all needs met.   Session 2: Patient met seated in wc in agreement with OT treatment session. Skilled services with focus on therapeutic activities as detailed below. Patient transported to therapy gym in standard wc with total assist for time management. Patient completed sit to stand from wc to RW with Mod A and Max cueing for foot placement, hand placement, and power negotiation as patient continues to demonstrate posterior lean in sitting and standing. Patient ambulated ~5-34f to mat table with Max cues for gait. Patient able to extend and flex digits, make a fist to squeeze this therapists fingers, and flex the shoulder ~75 degrees. Patient able to flex the elbow with hand over hand assist and increased time secondary to increased extensor tone. Anterior/posterior scoot in wc and at edge of mat with Min A and cueing for sequencing. Therapeutic activity with focus on static standing balance with LUE supported on RW, reaching/grasping, scanning to L and saccades. Patient ambulated ~5-752fback to wc. Session concluded with patient seated in wc with call bell within reach, belt alarm activated, and all needs met.   Therapy Documentation Precautions:  Precautions Precautions: Fall, Other (comment) Precaution Comments: left inattention, extensor patterns in LEs and trunk Restrictions Weight Bearing Restrictions: No   Therapy/Group: Individual Therapy  Shanisha Lech R [place]Davis 11/24/2019, 7:52 AM

## 2019-11-24 NOTE — Progress Notes (Signed)
Speech Language Pathology Daily Session Note  Patient Details  Name: Sarah Delacruz MRN: [id number] Date of Birth: 1924-03-08  Today's Date: 11/24/2019 SLP Individual Time: 4128-7867 SLP Individual Time Calculation (min): 30 min  Short Term Goals: Week 3: SLP Short Term Goal 1 (Week 3): Pt will consume current diet minimal overt s/sx aspiration and efficient mastication and oral clearance of solids with Supervision A multimodal cues for use of swallow strategies. SLP Short Term Goal 2 (Week 3): Pt will consume upgraded trials Dys 3 solids with efficient mastication and oral clearance X3 prior to advancement. SLP Short Term Goal 3 (Week 3): Pt will communicate at the word and phrase level with Min A cues for use of speech intelligibility strategies to achieve ~80% intelligibility. SLP Short Term Goal 4 (Week 3): Pt will demonstrate ability to recall new and daily information with Mod A multimodal cues for compensatory strategies. SLP Short Term Goal 5 (Week 3): Pt will demonstrate ability to problem solve during basic familiar tasks with Mod A verbal/visual cues. SLP Short Term Goal 6 (Week 3): Pt will scan left visual field and locate items on left side of environment in 90% opportunities with Min A multimodal cues.  Skilled Therapeutic Interventions: Pt was seen for skilled ST intervention targeting aforementioned goals. Pt was pleasant and cooperative with unfamiliar therapist. SLP facilitated session by engaging pt in conversation about the reasoning for rehab stay. Pt verbalized awareness of a stroke, and indicated therapies were working on "standing" and "feet and hands". Pt required mod assist to locate call bell under covers, but was able to indicate how to use it to call nursing for assistance. Pt recalled lunch items with min cues. Pt exhibits minimal carryover of strategies to improve intelligibility of speech, and is able to verbalize only "not talk so fast" when asked what she can do to  improve intelligibility. SLP reviewed strategies for overarticulation and stressing each syllable. Pt attended to the left, where I was sitting, making eye contact intermittently, throughout the session.  Pt was left in bed with alarm on, all needs within reach. Continue ST per current plan of care.   Pain Pain Assessment Pain Scale: 0-10 Pain Score: 0-No pain  Therapy/Group: Individual Therapy   Madell Heino B. Murvin Natal, [facility], CCC-SLP Speech Language Pathologist  Leigh Aurora 11/24/2019, 3:59 PM

## 2019-11-24 NOTE — Progress Notes (Signed)
[place] PHYSICAL MEDICINE & REHABILITATION PROGRESS NOTE   Subjective/Complaints:   Moved to a new room, no new issues   ROS: Denies abdominal pain, CP, SOB, N/V/D  Objective: No results found. Recent Labs    11/24/19 0612  WBC 5.9  HGB 9.7*  HCT 31.2*  PLT 348   No results for input(s): NA, K, CL, CO2, GLUCOSE, BUN, CREATININE, CALCIUM in the last 72 hours.  Intake/Output Summary (Last 24 hours) at 11/24/2019 1011 Last data filed at 11/24/2019 0800 Gross per 24 hour  Intake 250 ml  Output 350 ml  Net -100 ml     Physical Exam: Vital Signs Blood pressure (!) 145/58, pulse 65, temperature 97.8 F (36.6 C), resp. rate 20, height 5\' 6"  (1.676 m), weight 49.7 kg, SpO2 98 %.   General: No acute distress Mood and affect are appropriate Heart: Regular rate and rhythm no rubs murmurs or extra sounds Lungs: Clear to auscultation, breathing unlabored, no rales or wheezes Abdomen: Positive bowel sounds, soft nontender to palpation, nondistended Extremities: No clubbing, cyanosis, or edema Skin: No evidence of breakdown, no evidence of rash  LUE: 3/5 proximal distal LLE: 2+/5 proximal to distal  Assessment/Plan: 1. Functional deficits secondary to Right MCA infarct  which require 3+ hours per day of interdisciplinary therapy in a comprehensive inpatient rehab setting.  Physiatrist is providing close team supervision and 24 hour management of active medical problems listed below.  Physiatrist and rehab team continue to assess barriers to discharge/monitor patient progress toward functional and medical goals  Care Tool:  Bathing    Body parts bathed by patient: Chest, Abdomen, Left arm, Right upper leg, Left upper leg   Body parts bathed by helper: Front perineal area, Buttocks, Face Body parts n/a: Right arm, Left arm, Chest, Abdomen, Buttocks, Right upper leg, Left upper leg, Left lower leg, Right lower leg   Bathing assist Assist Level: Maximal Assistance - Patient  24 - 49%     Upper Body Dressing/Undressing Upper body dressing   What is the patient wearing?: Dress    Upper body assist Assist Level: Moderate Assistance - Patient 50 - 74%    Lower Body Dressing/Undressing Lower body dressing      What is the patient wearing?: Incontinence brief, Pants     Lower body assist Assist for lower body dressing: 2 Helpers     Toileting Toileting    Toileting assist Assist for toileting: 2 Helpers     Transfers Chair/bed transfer  Transfers assist     Chair/bed transfer assist level: Moderate Assistance - Patient 50 - 74% Chair/bed transfer assistive device: Walker, Armrests   Locomotion Ambulation   Ambulation assist   Ambulation activity did not occur: Safety/medical concerns(poor balance/high fall risk)  Assist level: Moderate Assistance - Patient 50 - 74% Assistive device: Walker-rolling Max distance: 30 ft   Walk 10 feet activity   Assist  Walk 10 feet activity did not occur: Safety/medical concerns(poor balance/high fall risk)  Assist level: Moderate Assistance - Patient - 50 - 74% Assistive device: Walker-rolling   Walk 50 feet activity   Assist Walk 50 feet with 2 turns activity did not occur: Safety/medical concerns(poor balance/high fall risk)  Assist level: Moderate Assistance - Patient - 50 - 74%(mod A and +2 w/c follow) Assistive device: Walker-Eva    Walk 150 feet activity   Assist Walk 150 feet activity did not occur: Safety/medical concerns(poor balance/high fall risk)         Walk 10 feet  on uneven surface  activity   Assist Walk 10 feet on uneven surfaces activity did not occur: Safety/medical concerns(poor balance/high fall risk)         Wheelchair     Assist Will patient use wheelchair at discharge?: Yes Type of Wheelchair: Manual    Wheelchair assist level: Dependent - Patient 0% Max wheelchair distance: 150 ft    Wheelchair 50 feet with 2 turns  activity    Assist        Assist Level: Dependent - Patient 0%   Wheelchair 150 feet activity     Assist      Assist Level: Dependent - Patient 0%   Blood pressure (!) 145/58, pulse 65, temperature 97.8 F (36.6 C), resp. rate 20, height 5\' 6"  (1.676 m), weight 49.7 kg, SpO2 98 %.   Medical Problem List and Plan: 1.  Left hemiparesis with sensory deficits, L-HH with right inattention, pusher tendencies to the left/back, severe dysarthria secondary to right MCA infarct.  Continue CIR PT, OT, SLP  2.  Antithrombotics: -DVT/anticoagulation:  Pharmaceutical: Lovenox             -antiplatelet therapy: ASA daily.  3. Pain Management: Tylenol prn.  4. Mood: LCSW to follow for evaluation and support.              -antipsychotic agents: N/A 5. Neuropsych: This patient is? capable of making decisions on her own behalf. 6. Skin/Wound Care: Routine pressure relief measure.  7. Fluids/Electrolytes/Nutrition: Monitor I/Os.   8. HTN: Monitor BP tid--continue hydralazine and BB (also was on microzide in the past)  Vitals:   11/24/19 0324 11/24/19 0848  BP: (!) 171/54 (!) 145/58  Pulse: 61 65  Resp: 20   Temp: 97.8 F (36.6 C)   SpO2: 98%    Hydralazine 10mg  4 times daily, increased to 25 on 3/28- HR OK, will cont to monitor   Increased lopressor to 25 mg,  9. Chronic diastolic CHF/Valvular disease: Monitor weights daily with I/Os. Continue hydralazine, metoprolol, pravastatin and ASA. No ARB/ACE due to CKD.              Daily weights Filed Weights   11/22/19 0423 11/23/19 0419 11/24/19 0324  Weight: 51.7 kg 49 kg 49.7 kg   Fairly stable  10. Dyslipidemia: On pravastatin.  11. GERD: resume PPI.  12. Chronic constipation: Continue Senna.  Adjust bowel meds as necessary. 13. CKD III: Monitor renal status with serial checks.    Creatinine 1.45 on 3/28  Continue to monitor 14. GU: now voiding but DC'd terazosin due to low BP, monitor for retention   Voiding 15.  Post stroke  dysphagia- given               D2 thins, advance diet as tolerated 16.  Bowel incont lkely multifactorial with reduced awareness, delay in notifying nurse and difficulty getting g on bedpan   Improving 17. Anemia:   Hemoglobin 9.4 on 3/24,9.7 on 3/29, stable   Fecal OB neg , may be dilutional due to IVF 18.  Spasticity- avoid tizanidine due to hypotension, avoid baclofen due to CKD, may need BOtox as OP, Left triceps  LOS: 19 days A FACE TO FACE EVALUATION WAS PERFORMED  Sarah Delacruz 11/24/2019, 10:11 AM

## 2019-11-25 ENCOUNTER — Inpatient Hospital Stay (HOSPITAL_COMMUNITY): Payer: Medicare Other | Admitting: Occupational Therapy

## 2019-11-25 ENCOUNTER — Inpatient Hospital Stay (HOSPITAL_COMMUNITY): Payer: Medicare Other | Admitting: Physical Therapy

## 2019-11-25 ENCOUNTER — Inpatient Hospital Stay (HOSPITAL_COMMUNITY): Payer: Medicare Other | Admitting: Speech Pathology

## 2019-11-25 NOTE — Progress Notes (Signed)
[place] PHYSICAL MEDICINE & REHABILITATION PROGRESS NOTE   Subjective/Complaints:  Labs reviewed, discussed BP with pt   ROS: Denies abdominal pain, CP, SOB, N/V/D  Objective: No results found. Recent Labs    11/24/19 0612  WBC 5.9  HGB 9.7*  HCT 31.2*  PLT 348   No results for input(s): NA, K, CL, CO2, GLUCOSE, BUN, CREATININE, CALCIUM in the last 72 hours.  Intake/Output Summary (Last 24 hours) at 11/25/2019 0801 Last data filed at 11/24/2019 2300 Gross per 24 hour  Intake 430 ml  Output --  Net 430 ml     Physical Exam: Vital Signs Blood pressure (!) 133/53, pulse 66, temperature 99.2 F (37.3 C), temperature source Oral, resp. rate 16, height 5\' 6"  (1.676 m), weight 50.4 kg, SpO2 98 %.     General: No acute distress Mood and affect are appropriate Heart: Regular rate and rhythm no rubs murmurs or extra sounds Lungs: Clear to auscultation, breathing unlabored, no rales or wheezes Abdomen: Positive bowel sounds, soft nontender to palpation, nondistended Extremities: No clubbing, cyanosis, or edema Skin: No evidence of breakdown, no evidence of rash   LUE: 3/5 proximal distal LLE: 2+/5 proximal to distal  Assessment/Plan: 1. Functional deficits secondary to Right MCA infarct  which require 3+ hours per day of interdisciplinary therapy in a comprehensive inpatient rehab setting.  Physiatrist is providing close team supervision and 24 hour management of active medical problems listed below.  Physiatrist and rehab team continue to assess barriers to discharge/monitor patient progress toward functional and medical goals  Care Tool:  Bathing    Body parts bathed by patient: Chest, Abdomen, Left arm, Right upper leg, Left upper leg   Body parts bathed by helper: Front perineal area, Buttocks, Face Body parts n/a: Right arm, Left arm, Chest, Abdomen, Buttocks, Right upper leg, Left upper leg, Left lower leg, Right lower leg   Bathing assist Assist Level:  Maximal Assistance - Patient 24 - 49%     Upper Body Dressing/Undressing Upper body dressing   What is the patient wearing?: Pull over shirt    Upper body assist Assist Level: Moderate Assistance - Patient 50 - 74%    Lower Body Dressing/Undressing Lower body dressing      What is the patient wearing?: Incontinence brief, Pants     Lower body assist Assist for lower body dressing: 2 Helpers     Toileting Toileting    Toileting assist Assist for toileting: Maximal Assistance - Patient 25 - 49%     Transfers Chair/bed transfer  Transfers assist     Chair/bed transfer assist level: Moderate Assistance - Patient 50 - 74% Chair/bed transfer assistive device: Walker, Armrests   Locomotion Ambulation   Ambulation assist   Ambulation activity did not occur: Safety/medical concerns(poor balance/high fall risk)  Assist level: Moderate Assistance - Patient 50 - 74% Assistive device: Walker-rolling Max distance: 30 ft   Walk 10 feet activity   Assist  Walk 10 feet activity did not occur: Safety/medical concerns(poor balance/high fall risk)  Assist level: Moderate Assistance - Patient - 50 - 74% Assistive device: Walker-rolling   Walk 50 feet activity   Assist Walk 50 feet with 2 turns activity did not occur: Safety/medical concerns(poor balance/high fall risk)  Assist level: Moderate Assistance - Patient - 50 - 74%(mod A and +2 w/c follow) Assistive device: Walker-Eva    Walk 150 feet activity   Assist Walk 150 feet activity did not occur: Safety/medical concerns(poor balance/high fall risk)  Walk 10 feet on uneven surface  activity   Assist Walk 10 feet on uneven surfaces activity did not occur: Safety/medical concerns(poor balance/high fall risk)         Wheelchair     Assist Will patient use wheelchair at discharge?: Yes Type of Wheelchair: Manual    Wheelchair assist level: Dependent - Patient 0% Max wheelchair distance: 150  ft    Wheelchair 50 feet with 2 turns activity    Assist        Assist Level: Dependent - Patient 0%   Wheelchair 150 feet activity     Assist      Assist Level: Dependent - Patient 0%   Blood pressure (!) 133/53, pulse 66, temperature 99.2 F (37.3 C), temperature source Oral, resp. rate 16, height 5\' 6"  (1.676 m), weight 50.4 kg, SpO2 98 %.   Medical Problem List and Plan: 1.  Left hemiparesis with sensory deficits, L-HH with right inattention, pusher tendencies to the left/back, severe dysarthria secondary to right MCA infarct.  Continue CIR PT, OT, SLP team conf in am  2.  Antithrombotics: -DVT/anticoagulation:  Pharmaceutical: Lovenox             -antiplatelet therapy: ASA daily.  3. Pain Management: Tylenol prn.  4. Mood: LCSW to follow for evaluation and support.              -antipsychotic agents: N/A 5. Neuropsych: This patient is? capable of making decisions on her own behalf. 6. Skin/Wound Care: Routine pressure relief measure.  7. Fluids/Electrolytes/Nutrition: Monitor I/Os.   8. HTN: Monitor BP tid--continue hydralazine and BB (also was on microzide in the past)  Vitals:   11/25/19 0017 11/25/19 0510  BP: (!) 133/53 (!) 133/53  Pulse: 64 66  Resp:  16  Temp:  99.2 F (37.3 C)  SpO2:  98%   Controlled Hydralazine 10mg  4 times daily, increased to 25 on 3/28- HR OK, will cont to monitor  lopressor  25 mg BID  9. Chronic diastolic CHF/Valvular disease: Monitor weights daily with I/Os. Continue hydralazine, metoprolol, pravastatin and ASA. No ARB/ACE due to CKD.              Daily weights Filed Weights   11/23/19 0419 11/24/19 0324 11/25/19 0510  Weight: 49 kg 49.7 kg 50.4 kg   Fairly stable  10. Dyslipidemia: On pravastatin.  11. GERD: resume PPI.  12. Chronic constipation: Continue Senna.  Adjust bowel meds as necessary. 13. CKD III: Monitor renal status with serial checks.    Creatinine 1.45 on 3/28- improved to 1.02 on 3/30   Continue to  monitor 14. GU: now voiding but DC'd terazosin due to low BP, monitor for retention   Voiding 15.  Post stroke dysphagia- given               D2 thins, advance diet as tolerated 16.  Bowel incont lkely multifactorial with reduced awareness, delay in notifying nurse and difficulty getting g on bedpan   Improving 17. Anemia:   Hemoglobin 9.4 on 3/24,9.7 on 3/29, stable   Fecal OB neg , may be dilutional due to IVF 18.  Spasticity- avoid tizanidine due to hypotension, avoid baclofen due to CKD, may need BOtox as OP, Left triceps  LOS: 20 days A FACE TO FACE EVALUATION WAS PERFORMED  4/24 11/25/2019, 8:01 AM

## 2019-11-25 NOTE — Progress Notes (Signed)
Slept good. Requesting bedpan vs getting up to [facility]. White coating to tongue. LBM 03/27. Sarah Delacruz A

## 2019-11-25 NOTE — Progress Notes (Signed)
Speech Language Pathology Daily Session Note  Patient Details  Name: Sarah Delacruz MRN: [id number] Date of Birth: May 24, 1924  Today's Date: 11/25/2019 SLP Individual Time: 0732-0800 SLP Individual Time Calculation (min): 28 min  Short Term Goals: Week 3: SLP Short Term Goal 1 (Week 3): Pt will consume current diet minimal overt s/sx aspiration and efficient mastication and oral clearance of solids with Supervision A multimodal cues for use of swallow strategies. SLP Short Term Goal 2 (Week 3): Pt will consume upgraded trials Dys 3 solids with efficient mastication and oral clearance X3 prior to advancement. SLP Short Term Goal 3 (Week 3): Pt will communicate at the word and phrase level with Min A cues for use of speech intelligibility strategies to achieve ~80% intelligibility. SLP Short Term Goal 4 (Week 3): Pt will demonstrate ability to recall new and daily information with Mod A multimodal cues for compensatory strategies. SLP Short Term Goal 5 (Week 3): Pt will demonstrate ability to problem solve during basic familiar tasks with Mod A verbal/visual cues. SLP Short Term Goal 6 (Week 3): Pt will scan left visual field and locate items on left side of environment in 90% opportunities with Min A multimodal cues.  Skilled Therapeutic Interventions: Pt was seen for skilled ST targeting dysphagia and speech. Although her intake was limited and required Max A to initiate PO consumption, she consumed Dys 2 breakfast solids with thin liquids without overt s/sx aspiration and Min A verbal cues for correcting min anterior loss of solids.  Pt continues to demonstrate poor carryover of overarticulation strategy for speehc intelligibility, requiring Mod-Max A verbal cues for implementation. Pt was ~80% intelligible at the phrase level when repeating phases with 2-3 syllable words containing consonant clusters. Pt left laying in bed with alarm set and needs met. Continue per current plan of care.          Pain Pain Assessment Pain Scale: 0-10 Pain Score: 0-No pain  Therapy/Group: Individual Therapy  Arbutus Leas 11/25/2019, 7:12 AM

## 2019-11-25 NOTE — Progress Notes (Signed)
Physical Therapy Session Note  Patient Details  Name: Sarah Delacruz MRN: [id number] Date of Birth: [birth year]  Today's Date: 11/25/2019 PT Individual Time: 1287-8676 PT Individual Time Calculation (min): 47 min   Short Term Goals: Week 3:  PT Short Term Goal 1 (Week 3): Pt will perform supine<>sit with min assist PT Short Term Goal 2 (Week 3): Pt will perform sit<>stand using LRAD with mod assist of 1 PT Short Term Goal 3 (Week 3): Pt will perform bed<>chair transfers using LRAD with mod assist of 1 person PT Short Term Goal 4 (Week 3): Pt will ambulate at least 66ft using LRAD with mod assist of 1 person  Skilled Therapeutic Interventions/Progress Updates:    Patient seated in w/c upon PT arrival, agreeable to therapy tx, denies pain. Pt transported to rehab gym in w/c for time management and energy conservation. Pt participated in blocked practice of the following: Sit > stand to RW with modA initially progressing to minA, max multimodal cueing for sequencing and additional time required as pt moves very slowly. Pt then ambulated 25 ft to mat table with minA > stand pivot transfer to mat with modA for balance and RW management max multimodal cueing for sequencing specifically verbalizing which LE to move at what time. Stand > sitting EOM with minA and continued cues for sequencing. Pt repeated functional mobility x 6 reps demonstrating improved initiation and attention to task, however continued max multimodal cues for sequencing with step by step instruction and additional time to allow pt to complete task without over assisting. Sit > stand from w/c to RW with minA and multimodal cueing for sequencing. Pt ambulated back to sink in room ~100 ft with RW and minA +2 for w/c follow. Pt performed hand hygiene in standing with cueing for technique and where to find necessary items (I.e. how to turn water on, location of soap, etc.) with minA for balance. Stand > sitting in w/c with minA. Pt left seated  in w/c with needs in reach and chair alarm set.   Therapy Documentation Precautions:  Precautions Precautions: Fall, Other (comment) Precaution Comments: left inattention, extensor patterns in LEs and trunk Restrictions Weight Bearing Restrictions: No    Therapy/Group: Individual Therapy  Blima Ledger SPT 11/25/2019, 7:51 AM

## 2019-11-25 NOTE — Progress Notes (Signed)
Occupational Therapy Session Note  Patient Details  Name: Sarah Delacruz MRN: [id number] Date of Birth: 07/08/1924  Today's Date: 11/25/2019 OT Individual Time: 6816-6196 OT Individual Time Calculation (min): 59 min    Short Term Goals: Week 3:  OT Short Term Goal 1 (Week 3): Patient will complete UB dressing with Mod A in supported sitting with no more than Mod multimodal cues. OT Short Term Goal 2 (Week 3): Patient will demonstrate dynamic sitting balance unsupported with Mod A in prep for UB bathing/dressing tasks. OT Short Term Goal 3 (Week 3): Pt will complete UB bathing in supported sitting with min assist. OT Short Term Goal 4 (Week 3): Patient will complete oral hygiene with Mod A seated at sink level with no more than 1-3 multimodal cues.  Skilled Therapeutic Interventions/Progress Updates:  Patient met lying supine in bed in agreement with OT treatment session. 0/10 pain reported at rest and with activity. Daughter present at bedside for duration of treatment session. Patient engaged in bathing seated EOB with ability to wash perineal area in sitting with set-up assist. Patient able to complete sit to stand from wc with Mod A and RW while therapist washed buttocks. UB dressing with Mod A and cueing for use of hemi-dressing techniques and LB dressing with patient able to initiate leaning forward. Assist required to thread BLE and hike brief/pants over hips in standing with BUE supported on RW. Cueing required for hand placement, food placement, and power negotiation as patient demonstrates extensor tone in LLE > LUE. Session concluded with patient seated in wc with call bell within reach, belt alarm activated, and all needs met.    Therapy Documentation Precautions:  Precautions Precautions: Fall, Other (comment) Precaution Comments: left inattention, extensor patterns in LEs and trunk Restrictions Weight Bearing Restrictions: No  Therapy/Group: Individual Therapy  Sherrick Araki R  [place] 11/25/2019, 7:47 AM

## 2019-11-26 ENCOUNTER — Inpatient Hospital Stay (HOSPITAL_COMMUNITY): Payer: Medicare Other

## 2019-11-26 ENCOUNTER — Inpatient Hospital Stay (HOSPITAL_COMMUNITY): Payer: Medicare Other | Admitting: Occupational Therapy

## 2019-11-26 ENCOUNTER — Inpatient Hospital Stay (HOSPITAL_COMMUNITY): Payer: Medicare Other | Admitting: Speech Pathology

## 2019-11-26 LAB — BASIC METABOLIC PANEL
Anion gap: 12 (ref 5–15)
BUN: 14 mg/dL (ref 8–23)
CO2: 24 mmol/L (ref 22–32)
Calcium: 8.9 mg/dL (ref 8.9–10.3)
Chloride: 99 mmol/L (ref 98–111)
Creatinine, Ser: 1.19 mg/dL — ABNORMAL HIGH (ref 0.44–1.00)
GFR calc Af Amer: 46 mL/min — ABNORMAL LOW (ref >60)
GFR calc non Af Amer: 39 mL/min — ABNORMAL LOW (ref >60)
Glucose, Bld: 96 mg/dL (ref 70–99)
Potassium: 3.6 mmol/L (ref 3.5–5.1)
Sodium: 135 mmol/L (ref 135–145)

## 2019-11-26 NOTE — Progress Notes (Signed)
Occupational Therapy Weekly Progress Note  Patient Details  Name: Sarah Delacruz MRN: [id number] Date of Birth: 1924-04-12  Beginning of progress report period: November 20, 2019 End of progress report period: November 27, 2019  Today's Date: 11/27/2019 OT Individual Time: 1130-1200 OT Individual Time Calculation (min):  30 min   OT Individual Time: 1330-1415 OT Individual Time Calculation (min):  45 min  Patient has met 2 of 4 short term goals. Patient making slow but steady progress toward goals this reporting period. Patient is continent of bowel and bladder requiring Mod A at best and Max A at most for stand-pivot transfers to [facility] on R with BLE knee block and heavy multimodal cues for foot placement, hand placement, sequencing, and orientation to midline demonstrating strong posterior lean and continued extensor tone in LUE/LLE. Patient able to bathe/dress UB with Mod A, increased time and Mod multimodal cues for hemi-dressing techniques. LB bath/dress requiring Mod A-Max A secondary to poor motor planning and need for Min A-Mod A to maintain dynamic sitting balance. Patient demonstrates improved sit to stand transfers requiring Min A at best with use of RW.   Patient continues to demonstrate the following deficits:muscle weakness,impaired timing and sequencing, abnormal tone, unbalanced muscle activation, motor apraxia, decreased coordination and decreased motor planning,decreased midline orientation and decreased attention to left,decreased awareness, decreased problem solving, decreased memory and delayed processingand decreased sitting balance, decreased standing balance, decreased postural control, hemiplegia and decreased balance strategiesand therefore will continue to benefit from skilled OT intervention to enhance overall performance with BADL and Reduce care partner burden.  Patient progressing toward long term goals..  Continue plan of care.  OT Short Term Goals Week 3:  OT Short  Term Goal 1 (Week 3): Patient will complete UB dressing with Mod A in supported sitting with no more than Mod multimodal cues. OT Short Term Goal 1 - Progress (Week 3): Met OT Short Term Goal 2 (Week 3): Patient will demonstrate dynamic sitting balance unsupported with Mod A in prep for UB bathing/dressing tasks. OT Short Term Goal 2 - Progress (Week 3): Met OT Short Term Goal 3 (Week 3): Pt will complete UB bathing in supported sitting with min assist. OT Short Term Goal 3 - Progress (Week 3): Progressing toward goal OT Short Term Goal 4 (Week 3): Patient will complete oral hygiene with Mod A seated at sink level with no more than 1-3 multimodal cues. OT Short Term Goal 4 - Progress (Week 3): Other (comment)(Patient declined oral hygiene tasks stating that she never does it at home.) Week 4:  OT Short Term Goal 1 (Week 4): Pt will complete UB bathing in supported sitting with min assist. OT Short Term Goal 2 (Week 4): Patient will complete transfers to [facility] with Mod A and Mod cues.  Skilled Therapeutic Interventions/Progress Updates:  Session 1: Patient met seated in wc in agreement with OT treatment session. 0/10 pain at rest and with activity. Daughter present at bedside. OT provided education on recommendation for DABSC secondary to patient with increased extensor tone in LLE and LUE, L inattention, difficulty ambulating, decreased motor planning, and decreased processing (although improved since initial eval) making functional transfers a hardship. Patient still requiring Mod A at best and Max A at most to transfer to [facility] with use of RW. DABSC would increase safety and decrease caregiver burden as patient would be able to complete squat-pivot transfers to and from commode with assistance from family. Patient transported to therapy gym with total A for time  management. Towel pushes on table top with focus on LUE activation including shoulder flexion, shoulder abduction, elbow flex/ext, and flex/ext of  digits in prep for functional use during ADLs. AAROM with UE ranger with hand over hand assist. Patient transported back to room. Session concluded with patient seated in wc with call bell within reach, belt alarm activated, and daughter present at bedside.      Session 2: Patient met seated in wc in agreement with OT treatment session. 0/10 pain reported at rest and with activity. Patient transported to therapy gym with total A. Patient requires Min A for anterior scooting toward edge of wc cushion. Sit to stand from wc with Min A, increased time, and multimodal cues power negotiation, facilitation of normal movement patterns, and hand/foot placement. Functional mobility ~3-38f with use of RW and Mod A progressing to Min A with cueing for proximity to mat and step length. Therapeutic activity with focus on sit to stand transfers, static/dyanmic standing balance, standing tolerance, L attention, and weight bearing in affected LUE placed on RW. Patient transported back to room with total assist. Session concluded with patient seated in wc with call bell within reach, belt alarm activated, and all needs met.   Therapy Documentation Precautions:  Precautions Precautions: Fall, Other (comment) Precaution Comments: left inattention, extensor patterns in LEs and trunk Restrictions Weight Bearing Restrictions: No  Therapy/Group: Individual Therapy  Cable Fearn R [place] 11/26/2019, 7:45 AM

## 2019-11-26 NOTE — Progress Notes (Signed)
Physical Therapy Session Note  Patient Details  Name: Sarah Delacruz MRN: [id number] Date of Birth: 1923/09/29  Today's Date: 11/26/2019 PT Individual Time: 1000-1030 and 1125-1156 PT Individual Time Calculation (min): 30 min and 31 min    Short Term Goals: Week 3:  PT Short Term Goal 1 (Week 3): Pt will perform supine<>sit with min assist PT Short Term Goal 2 (Week 3): Pt will perform sit<>stand using LRAD with mod assist of 1 PT Short Term Goal 3 (Week 3): Pt will perform bed<>chair transfers using LRAD with mod assist of 1 person PT Short Term Goal 4 (Week 3): Pt will ambulate at least 54ft using LRAD with mod assist of 1 person  Skilled Therapeutic Interventions/Progress Updates:    Session 1: Patient seated in w/c upon PT arrival, agreeable to therapy tx, denies pain. Pt transported to rehab gym in w/c for time management and energy conservation. Sit > stand from w/c to RW with minA with max multimodal cueing for sequencing with additional time required to complete task. Pt ambulated with RW 20 ft with minA for stability and RW management. Stand pivot transfer to mat with minA and significant cueing for sequencing and technique and time to allow pt to complete task. Sit > stand to RW from mat with same assist level. Pt ambulated 200 ft back to room with minA progressing to modA once inc fatigue for RW management and stability. Stand pivot transfer to w/c with modA for RW management and balance. Pt left seated in w/c with needs in reach and chair alarm set.   Session 2: Patient seated in w/c asleep upon PT arrival, agreeable to therapy tx, denies pain. Pt transported to rehab gym in w/c for time management and energy conservation. Sit <> stand from w/c to RW with minA with max multimodal cueing for sequencing with additional time required to complete task x 5 reps. Stand pivot transfer to mat table with RW and minA, significant cueing for sequencing and additional time needed. Sit <> stand from  w/c to RW with minA with max multimodal cueing for sequencing with additional time required to complete task x 5 reps for blocked practice, pt demonstrating improved initiation step by step sequencing when given multimodal cues. Sit > stand with RW and minA, pt ambulated 30' with RW and minA > stand pivot transfer to mat with minA > sitting EOM with minA x 2 reps. Stand pivot transfer with minA back to w/c. Pt transported back to room in w/c, left seated with needs in reach and chair alarm set.      Therapy Documentation Precautions:  Precautions Precautions: Fall, Other (comment) Precaution Comments: left inattention, extensor patterns in LEs and trunk Restrictions Weight Bearing Restrictions: No   Therapy/Group: Individual Therapy  Blima Ledger SPT 11/26/2019, 7:35 AM

## 2019-11-26 NOTE — Progress Notes (Signed)
Speech Language Pathology Daily Session Note  Patient Details  Name: Sarah Delacruz MRN: [id number] Date of Birth: [birth year]  Today's Date: 11/26/2019 SLP Individual Time: 0700-0730 SLP Individual Time Calculation (min): 30 min  Short Term Goals: Week 3: SLP Short Term Goal 1 (Week 3): Pt will consume current diet minimal overt s/sx aspiration and efficient mastication and oral clearance of solids with Supervision A multimodal cues for use of swallow strategies. SLP Short Term Goal 2 (Week 3): Pt will consume upgraded trials Dys 3 solids with efficient mastication and oral clearance X3 prior to advancement. SLP Short Term Goal 3 (Week 3): Pt will communicate at the word and phrase level with Min A cues for use of speech intelligibility strategies to achieve ~80% intelligibility. SLP Short Term Goal 4 (Week 3): Pt will demonstrate ability to recall new and daily information with Mod A multimodal cues for compensatory strategies. SLP Short Term Goal 5 (Week 3): Pt will demonstrate ability to problem solve during basic familiar tasks with Mod A verbal/visual cues. SLP Short Term Goal 6 (Week 3): Pt will scan left visual field and locate items on left side of environment in 90% opportunities with Min A multimodal cues.  Skilled Therapeutic Interventions: Skilled treatment session focused on dysphagia and speech goals. Upon arrival, patient requested to use the bedpan. Patient required Min verbal cues for problem solving with task but was continent of bladder. SLP facilitated session by providing Min-Mod A verbal cues to self-monitor and correct left pocketing of Dys. 2 textures. Suspect increased pocketing today due to dryness of eggs. Although patient required increased liquid washes, no overt s/s of aspiration were noted. Therefore, recommend patient continue current diet. Patient verbalized at both the phrase and sentence level with Min verbal cues needed for use of speech intelligibility strategies  to achieve ~75% intelligibility. Intelligibility was significantly reduced with increased length of utterances. Patient left upright in bed with alarm on and NT present. Continue with current plan of care.       Pain No/Denies Pain   Therapy/Group: Individual Therapy  Adeja Sarratt 11/26/2019, 12:35 PM

## 2019-11-26 NOTE — Progress Notes (Signed)
Occupational Therapy Session Note  Patient Details  Name: Sarah Delacruz MRN: [id number] Date of Birth: [birth year]  Today's Date: 11/26/2019 OT Individual Time: 7159-5396 OT Individual Time Calculation (min): 54 min    Short Term Goals: Week 3:  OT Short Term Goal 1 (Week 3): Patient will complete UB dressing with Mod A in supported sitting with no more than Mod multimodal cues. OT Short Term Goal 2 (Week 3): Patient will demonstrate dynamic sitting balance unsupported with Mod A in prep for UB bathing/dressing tasks. OT Short Term Goal 3 (Week 3): Pt will complete UB bathing in supported sitting with min assist. OT Short Term Goal 4 (Week 3): Patient will complete oral hygiene with Mod A seated at sink level with no more than 1-3 multimodal cues.  Skilled Therapeutic Interventions/Progress Updates:  Patient met lying supine in bed in agreement with skilled OT treatment session with focus on self-care re-education and NMR as detailed below. Supine > EOB with Mod A to assist trunk upright and increased time/effort. Patient indicated need to void/BM requiring Max A for stand pivot transfer to [facility] with use of RW and Mod cueing for foot/hand placement with bilateral knee block secondary to increased extensor tone. Patient required Max A for hygiene/clothing management in standing with cueing for orientation to midline demonstrating strong posterior lean. UB dressing in supported sitting with Mod A and cueing for hemi-dressing techniques. LB dressing with patient able to lean forward with multimodal cueing requiring Max A to thread BLE and hike pants over hips in standing. Patient declined oral hygiene seated at sink level. Session concluded with patient seated in wc with belt alarm activated, call bell within reach and all needs met.  Therapy Documentation Precautions:  Precautions Precautions: Fall, Other (comment) Precaution Comments: left inattention, extensor patterns in LEs and  trunk Restrictions Weight Bearing Restrictions: No  Therapy/Group: Individual Therapy  Hira Trent R [place]-Davis 11/26/2019, 12:16 PM

## 2019-11-26 NOTE — Patient Care Conference (Signed)
Inpatient RehabilitationTeam Conference and Plan of Care Update Date: 11/26/2019   Time: 10:45 AM    Patient Name: Sarah Delacruz      Medical Record Number: [id number]  Date of Birth: [birth year] Sex: Female         Room/Bed: 4W16C/4W16C-02 Payor Info: Payor: MEDICARE / Plan: MEDICARE PART A AND B / Product Type: *No Product type* /    Admit Date/Time:  11/05/2019  1:45 PM  Primary Diagnosis:  Acute right MCA stroke [facility])  Patient Active Problem List   Diagnosis Date Noted  . Labile blood pressure   . Acute right MCA stroke (HCC) 11/05/2019  . Acute cerebral infarction (HCC)   . Stage 3 chronic kidney disease   . Chronic constipation   . Chronic diastolic congestive heart failure (HCC)   . Essential hypertension   . Dysphagia, post-stroke     Expected Discharge Date: Expected Discharge Date: 12/05/19  Team Members Present: Physician leading conference: Dr. Claudette Laws Care Coodinator Present: Cheyenne Adas, RN, BSN, CRRN;Genie Riki Gehring, RN, MSN Nurse Present: Doran Durand, LPN PT Present: Woodfin Ganja, PT OT Present: Perrin Maltese, OT SLP Present: Suzzette Righter, CF-SLP     Current Status/Progress Goal Weekly Team Focus  Bowel/Bladder   continent of bowel and bladder; LBM 3/30  remain continent of bowel/bladder  assess q shift/prn   Swallow/Nutrition/ Hydration   dys 2, thins, Min A  Min A  tolerance dys 2, thin, d3 trials as indicated   ADL's   Mod A for UB bathing/dressing with hand over hand assist for use of LUE. Max A for LB bathing/dressing in sitting/standing with Max cues and use of RW. Max A for toilet transfers and toileting/hygiene/clothing mangagement. Patient no longer requiring +2 assist with bathing/dressing tasks.  min to mod assist overall  Self-care re-education, NMR, static/dynamic sitting/standing balance, therapeutic activities, therapeutic exercise.   Mobility   min A rolling L, max rolling R due to poor motor planning, min-mod A  supine>sitting, min-mod assist +1/+2 stand pivot RW (less assistance inconsistently with more practice), min-mod A Sit<>stands with RW (with significant cueing for anterior trunk lean prior to stand), gait with RW min-mod assist up to  30 ft  min-modA  facilitating anterior trunk lean prior to sit <> stands, blocked practice sit <> stand and stand pivot transfers, balance, strengthening, endurance, NMR, gait training   Communication   Min A increased vocal intensity, Max A articulatory cues  Min A  intelligibility phrase and sentence level emphasis on overarticulation strategy   Safety/Cognition/ Behavioral Observations  Min-Mod  Min A  visual scanning, functional problem solving, recall with aids/strategies, sustained attention   Pain   no c/o pain  remain pain free  assess q shift/prn   Skin   MASD buttocks  prevent further breakdown/infection  assess q shift/prn. cleanse/apply cream prn    Rehab Goals Patient on target to meet rehab goals: Yes *See Care Plan and progress notes for long and short-term goals.     Barriers to Discharge  Current Status/Progress Possible Resolutions Date Resolved   Nursing                  PT                    OT                  SLP                SW  Decreased caregiver support;Incontinence Grand-daughter seeking hired help toassist daughter with care when she is not available [place] services set up and access to incontinence supplies initiated          Discharge Planning/Teaching Needs:  Home with daughter  Transfers, Toileting, Bathing, Medications, etc   Team Discussion: MD BP lability, IV fluids at night DC, monitoring labs.  RN cont B/B, A+O.  OT mod A UB B/D, max LB B/D, max toilet transfers.  PT min/mod supine to sit, transfers mod A, min gait to mod A for longer distances, needs extensive cuing, Dtr will need fam ed for several days.  SLP D2thins, cues for sequencing tasks, min A goals.  Has a Dtr and GDtr to assist at home.   Revisions to  Treatment Plan: N/A     Medical Summary Current Status: Intake improving , maintaining hydration without IVF, labs stable, some BP lability but overall improved, Improved LUE strength Weekly Focus/Goal: neuromuscular re ed LUE  Barriers to Discharge: Medical stability   Possible Resolutions to Barriers: cont to enc fluid, cont walker trials with PT, cont cuing for sequencing, may need to downgrade   Continued Need for Acute Rehabilitation Level of Care: The patient requires daily medical management by a physician with specialized training in physical medicine and rehabilitation for the following reasons: Direction of a multidisciplinary physical rehabilitation program to maximize functional independence : Yes Medical management of patient stability for increased activity during participation in an intensive rehabilitation regime.: Yes Analysis of laboratory values and/or radiology reports with any subsequent need for medication adjustment and/or medical intervention. : Yes   I attest that I was present, lead the team conference, and concur with the assessment and plan of the team.   [name] 11/26/2019, 4:13 PM   Team conference was held via web/ teleconference due to [place] - 19

## 2019-11-26 NOTE — Progress Notes (Addendum)
[place] PHYSICAL MEDICINE & REHABILITATION PROGRESS NOTE   Subjective/Complaints:  No issues overnite  Labwork reviewed   ROS: Denies abdominal pain, CP, SOB, N/V/D  Objective: No results found. Recent Labs    11/24/19 0612  WBC 5.9  HGB 9.7*  HCT 31.2*  PLT 348   Recent Labs    11/26/19 0011  NA 135  K 3.6  CL 99  CO2 24  GLUCOSE 96  BUN 14  CREATININE 1.19*  CALCIUM 8.9    Intake/Output Summary (Last 24 hours) at 11/26/2019 0902 Last data filed at 11/26/2019 0730 Gross per 24 hour  Intake 320 ml  Output --  Net 320 ml     Physical Exam: Vital Signs Blood pressure (!) 155/51, pulse 68, temperature 97.8 F (36.6 C), temperature source Oral, resp. rate 16, height 5\' 6"  (1.676 m), weight 50.1 kg, SpO2 98 %.    General: No acute distress Mood and affect are appropriate Heart: Regular rate and rhythm no rubs murmurs or extra sounds Lungs: Clear to auscultation, breathing unlabored, no rales or wheezes Abdomen: Positive bowel sounds, soft nontender to palpation, nondistended Extremities: No clubbing, cyanosis, or edema Skin: No evidence of breakdown, no evidence of rash  LUE: 3- delt , biceps triceps , grasp and release LLE: 4-/5 HF, KE, ADF   Assessment/Plan: 1. Functional deficits secondary to Right MCA infarct  which require 3+ hours per day of interdisciplinary therapy in a comprehensive inpatient rehab setting.  Physiatrist is providing close team supervision and 24 hour management of active medical problems listed below.  Physiatrist and rehab team continue to assess barriers to discharge/monitor patient progress toward functional and medical goals  Care Tool:  Bathing    Body parts bathed by patient: Buttocks, Front perineal area   Body parts bathed by helper: Right arm, Buttocks, Right lower leg, Left lower leg Body parts n/a: Right arm, Left arm, Chest, Abdomen, Right upper leg, Left upper leg, Right lower leg, Left lower leg, Face    Bathing assist Assist Level: Maximal Assistance - Patient 24 - 49%     Upper Body Dressing/Undressing Upper body dressing   What is the patient wearing?: Pull over shirt    Upper body assist Assist Level: Moderate Assistance - Patient 50 - 74%    Lower Body Dressing/Undressing Lower body dressing      What is the patient wearing?: Incontinence brief, Pants     Lower body assist Assist for lower body dressing: Maximal Assistance - Patient 25 - 49%     Toileting Toileting    Toileting assist Assist for toileting: Maximal Assistance - Patient 25 - 49%     Transfers Chair/bed transfer  Transfers assist     Chair/bed transfer assist level: Moderate Assistance - Patient 50 - 74% Chair/bed transfer assistive device: Walker,   Ambulation assist   Ambulation activity did not occur: Safety/medical concerns(poor balance/high fall risk)  Assist level: Minimal Assistance - Patient > 75% Assistive device: Walker-rolling Max distance: 100 ft   Walk 10 feet activity   Assist  Walk 10 feet activity did not occur: Safety/medical concerns(poor balance/high fall risk)  Assist level: Minimal Assistance - Patient > 75% Assistive device: Walker-rolling   Walk 50 feet activity   Assist Walk 50 feet with 2 turns activity did not occur: Safety/medical concerns(poor balance/high fall risk)  Assist level: Minimal Assistance - Patient > 75% Assistive device: Walker-rolling    Walk 150 feet activity   Assist  Walk 150 feet activity did not occur: Safety/medical concerns(poor balance/high fall risk)         Walk 10 feet on uneven surface  activity   Assist Walk 10 feet on uneven surfaces activity did not occur: Safety/medical concerns(poor balance/high fall risk)         Wheelchair     Assist Will patient use wheelchair at discharge?: Yes Type of Wheelchair: Manual    Wheelchair assist level: Dependent - Patient 0% Max  wheelchair distance: 150 ft    Wheelchair 50 feet with 2 turns activity    Assist        Assist Level: Dependent - Patient 0%   Wheelchair 150 feet activity     Assist      Assist Level: Dependent - Patient 0%   Blood pressure (!) 155/51, pulse 68, temperature 97.8 F (36.6 C), temperature source Oral, resp. rate 16, height 5\' 6"  (1.676 m), weight 50.1 kg, SpO2 98 %.   Medical Problem List and Plan: 1.  Left hemiparesis with sensory deficits, L-HH with right inattention, pusher tendencies to the left/back, severe dysarthria secondary to right MCA infarct.  Continue CIR PT, OT, SLP  Good progress described by OT  2.  Antithrombotics: -DVT/anticoagulation:  Pharmaceutical: Lovenox             -antiplatelet therapy: ASA daily.  3. Pain Management: Tylenol prn.  4. Mood: LCSW to follow for evaluation and support.              -antipsychotic agents: N/A 5. Neuropsych: This patient is? capable of making decisions on her own behalf. 6. Skin/Wound Care: Routine pressure relief measure.  7. Fluids/Electrolytes/Nutrition: Monitor I/Os.   8. HTN: Monitor BP tid--continue hydralazine and BB (also was on microzide in the past)  Vitals:   11/25/19 2339 11/26/19 0426  BP: (!) 132/55 (!) 155/51  Pulse: 68 68  Resp:  16  Temp:  97.8 F (36.6 C)  SpO2:  98%   Controlled Hydralazine 10mg  4 times daily, increased to 25 on 3/28- HR OK, will cont to monitor  lopressor  25 mg BID HR in 60s -ok  9. Chronic diastolic CHF/Valvular disease: Monitor weights daily with I/Os. Continue hydralazine, metoprolol, pravastatin and ASA. No ARB/ACE due to CKD.              Daily weights Filed Weights   11/24/19 0324 11/25/19 0510 11/26/19 0426  Weight: 49.7 kg 50.4 kg 50.1 kg   Fairly stable  10. Dyslipidemia: On pravastatin.  11. GERD: resume PPI.  12. Chronic constipation: Continue Senna.  Adjust bowel meds as necessary. 13. CKD III: Monitor renal status with serial checks.    Creatinine  1.45 on 3/28- improved to 1.02 on 3/30   Continue to monitor 14. GU: now voiding but DC'd terazosin due to low BP, monitor for retention   Voiding 15.  Post stroke dysphagia- given               D2 thins, advance diet as tolerated 16.  Bowel incont lkely multifactorial with reduced awareness, delay in notifying nurse and difficulty getting g on bedpan   Improving 17. Anemia:   Hemoglobin 9.4 on 3/24,9.7 on 3/29, stable   Fecal OB neg , may be dilutional due to IVF 18.  Spasticity- avoid tizanidine due to hypotension, avoid baclofen due to CKD, may need BOtox as OP, Left triceps- improving , moving out of extensor syndergy pattern in elbow, also able to grasp and release  LOS: 21 days A FACE TO FACE EVALUATION WAS PERFORMED  Erick Colace 11/26/2019, 9:02 AM

## 2019-11-27 ENCOUNTER — Inpatient Hospital Stay (HOSPITAL_COMMUNITY): Payer: Medicare Other | Admitting: Speech Pathology

## 2019-11-27 ENCOUNTER — Inpatient Hospital Stay (HOSPITAL_COMMUNITY): Payer: Medicare Other | Admitting: Occupational Therapy

## 2019-11-27 ENCOUNTER — Inpatient Hospital Stay (HOSPITAL_COMMUNITY): Payer: Medicare Other | Admitting: Physical Therapy

## 2019-11-27 MED ORDER — GENERIC EXTERNAL MEDICATION
Status: DC
Start: ? — End: 2019-11-27

## 2019-11-27 MED ORDER — WHITE PETROLATUM EX OINT
TOPICAL_OINTMENT | CUTANEOUS | Status: AC
  Filled 2019-11-27: qty 28.35

## 2019-11-27 NOTE — Progress Notes (Signed)
Speech Language Pathology Weekly Progress and Session Note  Patient Details  Name: Sarah Delacruz MRN: [id number] Date of Birth: 01/28/1924  Beginning of progress report period: November 20, 2019 End of progress report period: November 27, 2019  Today's Date: 11/27/2019 SLP Individual Time: 8756-4332 SLP Individual Time Calculation (min): 25 min  Short Term Goals: Week 3: SLP Short Term Goal 1 (Week 3): Pt will consume current diet minimal overt s/sx aspiration and efficient mastication and oral clearance of solids with Supervision A multimodal cues for use of swallow strategies. SLP Short Term Goal 1 - Progress (Week 3): Not met SLP Short Term Goal 2 (Week 3): Pt will consume upgraded trials Dys 3 solids with efficient mastication and oral clearance X3 prior to advancement. SLP Short Term Goal 2 - Progress (Week 3): Not met SLP Short Term Goal 3 (Week 3): Pt will communicate at the word and phrase level with Min A cues for use of speech intelligibility strategies to achieve ~80% intelligibility. SLP Short Term Goal 3 - Progress (Week 3): Met SLP Short Term Goal 4 (Week 3): Pt will demonstrate ability to recall new and daily information with Mod A multimodal cues for compensatory strategies. SLP Short Term Goal 5 (Week 3): Pt will demonstrate ability to problem solve during basic familiar tasks with Mod A verbal/visual cues. SLP Short Term Goal 5 - Progress (Week 3): Met SLP Short Term Goal 6 (Week 3): Pt will scan left visual field and locate items on left side of environment in 90% opportunities with Min A multimodal cues. SLP Short Term Goal 6 - Progress (Week 3): Met    New Short Term Goals: Week 4: SLP Short Term Goal 1 (Week 4): STGs=LTGs due to ELOS  Weekly Progress Updates: Patient continues to make slow but functional gains and has met 6 of 6 STGs this reporting period. Currently, patient is consuming Dys. 2 textures with thin liquids with minimal overt s/s of aspiration but Min verbal  cues to self-monitor and correct left oral pocketing, therefore, trials of dys. 3 textures have not been attempted. Patient also requires overall Mod A multimodal cues for basic problem solving, scanning/attenging to left field of environment and for recall of functional information. Patient with improved speech intelligibility at the word and phrase level to ~89% with overall Min verbal cues for use of speech intelligibility strategies, however, intelligibility is reduced at the sentence level. Patient and family education is ongoing. Patient would benefit from continued skilled SLP intervention to maximize her swallowing, cognitive and speech function prior to discharge.      Intensity: Minumum of 1-2 x/day, 30 to 90 minutes Frequency: 3 to 5 out of 7 days Duration/Length of Stay: 12/05/19 Treatment/Interventions: Cueing hierarchy;Cognitive remediation/compensation;Environmental controls;Internal/external aids;Speech/Language facilitation;Functional tasks;Dysphagia/aspiration precaution training;Patient/family education;Therapeutic Activities   Daily Session  Skilled Therapeutic Interventions: Skilled treatment session focused on cognitive goals. SLP facilitated session by providing Min A verbal cues for problem solving during a 4-step picture sequencing task. Patient was ~80% intelligibile when describing the actions within the pictures at the sentence level in a mildly noisy environment with overall Min verbal cues. Min-Mod verbal cues were needed for left visual scanning throughout task. Patient left upright in wheelchair with alarm on and all needs within reach. Continue with current plan of care.   Pain No/Denies Pain   Therapy/Group: Individual Therapy  Aimi Essner 11/27/2019, 6:41 AM

## 2019-11-27 NOTE — Plan of Care (Signed)
  Problem: RH Wheelchair Mobility Goal: LTG Patient will propel w/c in controlled environment (PT) Description: LTG: Patient will propel wheelchair in controlled environment, # of feet with assist (PT) Outcome: Not Applicable Flowsheets (Taken 11/27/2019 1429) LTG: Pt will propel w/c in controlled environ  assist needed:: (discontinue goal as pt will not functionally propel the w/c) -- Note: discontinue goal as pt will not functionally propel the w/c Goal: LTG Patient will propel w/c in home environment (PT) Description: LTG: Patient will propel wheelchair in home environment, # of feet with assistance (PT). Outcome: Not Applicable Flowsheets (Taken 11/27/2019 1429) LTG: Pt will propel w/c in home environ  assist needed:: (discontinue goal as pt will not functionally propel the w/c) -- Note: discontinue goal as pt will not functionally propel the w/c

## 2019-11-27 NOTE — Progress Notes (Signed)
Physical Therapy Weekly Progress Note  Patient Details  Name: Sarah Delacruz MRN: [id number] Date of Birth: 12-16-1923  Beginning of progress report period: November 20, 2019 End of progress report period: November 27, 2019  Today's Date: 11/27/2019 PT Individual Time: 5366-4403 PT Individual Time Calculation (min): 31 min   Patient has met 4 of 4 short term goals. Sarah Delacruz is progressing well with therapy demonstrating improving motor planning; however, continues to require fluctuating levels of assist (ranging from min to max) during functional sit<>stand and stand pivot transfers using RW due to extensor tone and posterior lean. Therapy sessions have focused on blocked practice transfers and short distance ambulation for improved motor planning and decreased assistance with these tasks in preparation for D/C home next week. She is ambulating up to 221f using RW with initially min assist progressed to mod assist for balance and AD management due to fatigue.  Patient continues to demonstrate the following deficits muscle weakness and muscle joint tightness, decreased cardiorespiratoy endurance, impaired timing and sequencing, abnormal tone, unbalanced muscle activation, motor apraxia, decreased coordination and decreased motor planning, decreased attention to left, decreased initiation, decreased attention, decreased awareness, decreased problem solving, decreased safety awareness, decreased memory and delayed processing and decreased sitting balance, decreased standing balance, decreased postural control and decreased balance strategies and therefore will continue to benefit from skilled PT intervention to increase functional independence with mobility.  Patient progressing toward long term goals. Discontinued long term w/c goals as pt will not functionally propel the wheelchair. Continue plan of care.  PT Short Term Goals Week 3:  PT Short Term Goal 1 (Week 3): Pt will perform supine<>sit with min  assist PT Short Term Goal 1 - Progress (Week 3): Met PT Short Term Goal 2 (Week 3): Pt will perform sit<>stand using LRAD with mod assist of 1 PT Short Term Goal 2 - Progress (Week 3): Met PT Short Term Goal 3 (Week 3): Pt will perform bed<>chair transfers using LRAD with mod assist of 1 person PT Short Term Goal 3 - Progress (Week 3): Met PT Short Term Goal 4 (Week 3): Pt will ambulate at least 563fusing LRAD with mod assist of 1 person PT Short Term Goal 4 - Progress (Week 3): Met Week 4:  PT Short Term Goal 1 (Week 4): = to LTGs based on ELOS  Skilled Therapeutic Interventions/Progress Updates:  Ambulation/gait training;DME/adaptive equipment instruction;Psychosocial support;UE/LE Strength taining/ROM;Balance/vestibular training;Functional electrical stimulation;Skin care/wound management;UE/LE Coordination activities;Cognitive remediation/compensation;Functional mobility training;Splinting/orthotics;Visual/perceptual remediation/compensation;Community reintegration;Stair training;Neuromuscular re-education;Wheelchair propulsion/positioning;Discharge planning;Pain management;Therapeutic Activities;Disease management/prevention;Patient/family education;Therapeutic Exercise  Pt received supine in bed with NT present assisting pt with breakfast. Pt agreeable to therapy session but requests to take a few more bites of food prior to getting OOB. Therapist engaged pt in conversation on her left side to address L inattention while completing meal. Therapist donned B LE TEDs max assist. Supine>sitting L EOB with HOB slightly elevated and mod assist for trunk upright. Donned pants and shoes sitting EOB with max assist. Scooted hips towards EOB with CGA for trunk control and increased time/effort. Sit>stand EOB>RW with mod assist for lifting into standing due to strong posterior lean this morning. Required mod assist to maintain standing due to posterior lean with mod assist to pull pants over hips. Stand  pivot to w/c using RW with mod assist for balance and AD management while turning though posterior lean improved during transfer pt continued to require mod assist to prevent LOB. Pt left seated in w/c with needs in reach, LUE  supported on w/c lap trap, and seat belt alarm on.  Therapy Documentation Precautions:  Precautions Precautions: Fall, Other (comment) Precaution Comments: left inattention, extensor patterns in LEs and trunk Restrictions Weight Bearing Restrictions: No  Pain:   No reports of pain throughout session.  Therapy/Group: Individual Therapy  Tawana Scale, PT, DPT 11/27/2019, 7:55 AM

## 2019-11-27 NOTE — Progress Notes (Signed)
Team Conference Report to Patient/Family  Team Conference discussion was reviewed with the patient's daughter, including goals, any changes in plan of care and target discharge date.  Patient's daughter expressed understanding and is in agreement.  The patient has a target discharge date of 12/05/19. Requested family education 4/6-4/8 and family to discuss times and confirm dates/times that will be best for educ. Message left for grand-daughter regarding team conf update and request for family education times.  Chana Bode B 11/27/2019, 9:57 AM

## 2019-11-27 NOTE — Plan of Care (Signed)
Problem: RH Stairs Goal: LTG Patient will ambulate up and down stairs w/assist (PT) Description: LTG: Patient will ambulate up and down # of stairs with assistance (PT) Outcome: Not Applicable Flowsheets (Taken 11/27/2019 1543) LTG: Pt will ambulate up/down stairs assist needed:: (will have ramped entrance into home) -- Note: will have ramped entrance into home

## 2019-11-28 ENCOUNTER — Inpatient Hospital Stay (HOSPITAL_COMMUNITY): Payer: Medicare Other | Admitting: Speech Pathology

## 2019-11-28 ENCOUNTER — Inpatient Hospital Stay (HOSPITAL_COMMUNITY): Payer: Medicare Other | Admitting: Physical Therapy

## 2019-11-28 ENCOUNTER — Inpatient Hospital Stay (HOSPITAL_COMMUNITY): Payer: Medicare Other

## 2019-11-28 ENCOUNTER — Inpatient Hospital Stay (HOSPITAL_COMMUNITY): Payer: Medicare Other | Admitting: Occupational Therapy

## 2019-11-28 NOTE — Progress Notes (Signed)
Speech Language Pathology Daily Session Note  Patient Details  Name: Sarah Delacruz MRN: [id number] Date of Birth: [birth year]  Today's Date: 11/28/2019 SLP Individual Time: 6720-9198 SLP Individual Time Calculation (min): 25 min  Short Term Goals: Week 4: SLP Short Term Goal 1 (Week 4): STGs=LTGs due to ELOS  Skilled Therapeutic Interventions: Skilled treatment session focused on cognitive goals. SLP facilitated session by providing Min-Mod A verbal and visual cues for problem solving during a 6-step picture sequencing task. Patient was ~90% intelligibile when describing the actions within the pictures at the sentence level in a quiet environment with overall supervision verbal cues. Min-Mod verbal cues were needed for left visual scanning and attention to her LUE throughout task. Patient left upright in bed with alarm on and all needs within reach. Continue with current plan of care     Pain No/Denies Pain   Therapy/Group: Individual Therapy  Tymon Nemetz 11/28/2019, 2:23 PM

## 2019-11-28 NOTE — Progress Notes (Signed)
Physical Therapy Session Note  Patient Details  Name: Sarah Delacruz MRN: [id number] Date of Birth: 05/10/1924  Today's Date: 11/28/2019 PT Missed Time: 30 Minutes Missed Time Reason: Patient fatigue   Pt received asleep, supine in bed but easily awakens to verbal stimulus. Pt denies therapy at this time stating she just finished her other therapy session and has already been in the chair/walked today and was wanting to laying down and rest. Despite encouragement and education on benefits of skilled physical therapy, pt continues to decline therapy at this time requesting to participate tomorrow. Missed 30 minutes of skilled physical therapy.   Ginny Forth, PT, DPT 11/28/2019, 12:39 PM

## 2019-11-28 NOTE — Progress Notes (Signed)
Physical Therapy Session Note  Patient Details  Name: Nyellie Yetter MRN: [id number] Date of Birth: 1924/05/25  Today's Date: 11/28/2019 PT Individual Time: 8441-7127 PT Individual Time Calculation (min): 30 min   Short Term Goals: Week 4:  PT Short Term Goal 1 (Week 4): = to LTGs based on ELOS  Skilled Therapeutic Interventions/Progress Updates:     Pt received seated in WC. No report of pain.  Sit to stand with modA, extra time required for verbal cues on sequencing and assisting pt to strap L hand in grip-assist on RW. Pt initially pushing into extension, especially with LLE, while initiating sit to stand, but is able to correct with verbal cues for improved foot placement.  x3 bouts of ambulation x30' each. Pt with forward flexed posture and requiring modA primarily to assist with safe RW management and slight posterior lean. Pt has difficulty keeping herself within safe proximity to RW and requires frequent cues to increase upright posture and gaze, stride length, and RW placement while transitioning to WC.   MinA for for return to supine. Pt left supine with alarm intact and all needs within reach.  Therapy Documentation Precautions:  Precautions Precautions: Fall, Other (comment) Precaution Comments: left inattention, extensor patterns in LEs and trunk Restrictions Weight Bearing Restrictions: No    Therapy/Group: Individual Therapy  Beau Fanny, PT, DPT 11/28/2019, 3:41 PM

## 2019-11-28 NOTE — Plan of Care (Signed)
  Problem: Consults Goal: RH STROKE PATIENT EDUCATION Description: See Patient Education module for education specifics  Outcome: Progressing   Problem: RH BOWEL ELIMINATION Goal: RH STG MANAGE BOWEL WITH ASSISTANCE Description: STG Manage Bowel with min Assistance. Outcome: Progressing Goal: RH STG MANAGE BOWEL W/MEDICATION W/ASSISTANCE Description: STG Manage Bowel with Medication with min Assistance. Outcome: Progressing   Problem: RH BLADDER ELIMINATION Goal: RH STG MANAGE BLADDER WITH ASSISTANCE Description: STG Manage Bladder With min Assistance Outcome: Progressing   Problem: RH SKIN INTEGRITY Goal: RH STG SKIN FREE OF INFECTION/BREAKDOWN Description: Patients skin will remain free from further infection or breakdown with min assist. Outcome: Progressing Goal: RH STG MAINTAIN SKIN INTEGRITY WITH ASSISTANCE Description: STG Maintain Skin Integrity With min Assistance. Outcome: Progressing   Problem: RH SAFETY Goal: RH STG ADHERE TO SAFETY PRECAUTIONS W/ASSISTANCE/DEVICE Description: STG Adhere to Safety Precautions With supervision Assistance/Device. Outcome: Progressing   Problem: RH KNOWLEDGE DEFICIT Goal: RH STG INCREASE KNOWLEDGE OF HYPERTENSION Description: Patient/caregiver will verbalize understanding of management of HTN including diet, exercise, medications, monitoring, and follow up care with min assist. Outcome: Progressing Goal: RH STG INCREASE KNOWLEDGE OF DYSPHAGIA/FLUID INTAKE Description: Patient/caregiver will verbalize understanding of management of dysphagia as guided by SLP. Outcome: Progressing Goal: RH STG INCREASE KNOWLEGDE OF HYPERLIPIDEMIA Description: Patient/caregiver will verbalize understanding of management of HLD including diet, exercise, medications, monitoring, and follow up care with min assist. Outcome: Progressing

## 2019-11-28 NOTE — Progress Notes (Signed)
[facility] PHYSICAL MEDICINE & REHABILITATION PROGRESS NOTE   Subjective/Complaints:  Pt feels ok today , remembered grasp and release exercises  ROS: Denies abdominal pain, CP, SOB, N/V/D  Objective: No results found. No results for input(s): WBC, HGB, HCT, PLT in the last 72 hours. Recent Labs    11/26/19 0011  NA 135  K 3.6  CL 99  CO2 24  GLUCOSE 96  BUN 14  CREATININE 1.19*  CALCIUM 8.9    Intake/Output Summary (Last 24 hours) at 11/28/2019 0913 Last data filed at 11/28/2019 0750 Gross per 24 hour  Intake 280 ml  Output --  Net 280 ml     Physical Exam: Vital Signs Blood pressure (!) 154/51, pulse 64, temperature 98.3 F (36.8 C), resp. rate 16, height 5\' 6"  (1.676 m), weight 51.5 kg, SpO2 99 %.     General: No acute distress Mood and affect are appropriate Heart: Regular rate and rhythm no rubs murmurs or extra sounds Lungs: Clear to auscultation, breathing unlabored, no rales or wheezes Abdomen: Positive bowel sounds, soft nontender to palpation, nondistended Extremities: No clubbing, cyanosis, or edema Skin: No evidence of breakdown, no evidence of rash Neurologic  LUE: 3- delt , biceps triceps , grasp and release LLE: 4-/5 HF, KE, ADF   Assessment/Plan: 1. Functional deficits secondary to Right MCA infarct  which require 3+ hours per day of interdisciplinary therapy in a comprehensive inpatient rehab setting.  Physiatrist is providing close team supervision and 24 hour management of active medical problems listed below.  Physiatrist and rehab team continue to assess barriers to discharge/monitor patient progress toward functional and medical goals  Care Tool:  Bathing    Body parts bathed by patient: Buttocks, Front perineal area   Body parts bathed by helper: Right arm, Buttocks, Right lower leg, Left lower leg Body parts n/a: Right arm, Left arm, Chest, Abdomen, Right upper leg, Left upper leg, Right lower leg, Left lower leg, Face   Bathing  assist Assist Level: Maximal Assistance - Patient 24 - 49%     Upper Body Dressing/Undressing Upper body dressing   What is the patient wearing?: Pull over shirt    Upper body assist Assist Level: Moderate Assistance - Patient 50 - 74%    Lower Body Dressing/Undressing Lower body dressing      What is the patient wearing?: Incontinence brief, Pants     Lower body assist Assist for lower body dressing: Maximal Assistance - Patient 25 - 49%     Toileting Toileting    Toileting assist Assist for toileting: Maximal Assistance - Patient 25 - 49%     Transfers Chair/bed transfer  Transfers assist     Chair/bed transfer assist level: Moderate Assistance - Patient 50 - 74% Chair/bed transfer assistive device:   Ambulation assist   Ambulation activity did not occur: Safety/medical concerns(poor balance/high fall risk)  Assist level: Moderate Assistance - Patient 50 - 74% Assistive device: Walker-rolling Max distance: 200 ft   Walk 10 feet activity   Assist  Walk 10 feet activity did not occur: Safety/medical concerns(poor balance/high fall risk)  Assist level: Moderate Assistance - Patient - 50 - 74% Assistive device: Walker-rolling   Walk 50 feet activity   Assist Walk 50 feet with 2 turns activity did not occur: Safety/medical concerns(poor balance/high fall risk)  Assist level: Moderate Assistance - Patient - 50 - 74% Assistive device: Walker-rolling    Walk 150 feet activity   Assist Walk 150  feet activity did not occur: Safety/medical concerns(poor balance/high fall risk)  Assist level: Moderate Assistance - Patient - 50 - 74% Assistive device: Walker-rolling    Walk 10 feet on uneven surface  activity   Assist Walk 10 feet on uneven surfaces activity did not occur: Safety/medical concerns(poor balance/high fall risk)         Wheelchair     Assist Will patient use wheelchair at discharge?: Yes Type of  Wheelchair: Manual    Wheelchair assist level: Dependent - Patient 0% Max wheelchair distance: 150 ft    Wheelchair 50 feet with 2 turns activity    Assist        Assist Level: Dependent - Patient 0%   Wheelchair 150 feet activity     Assist      Assist Level: Dependent - Patient 0%   Blood pressure (!) 154/51, pulse 64, temperature 98.3 F (36.8 C), resp. rate 16, height 5\' 6"  (1.676 m), weight 51.5 kg, SpO2 99 %.   Medical Problem List and Plan: 1.  Left hemiparesis with sensory deficits, L-HH with right inattention, pusher tendencies to the left/back, severe dysarthria secondary to right MCA infarct.  Continue CIR PT, OT, SLP  Good progress described by OT  2.  Antithrombotics: -DVT/anticoagulation:  Pharmaceutical: Lovenox             -antiplatelet therapy: ASA daily.  3. Pain Management: Tylenol prn.  4. Mood: LCSW to follow for evaluation and support.              -antipsychotic agents: N/A 5. Neuropsych: This patient is? capable of making decisions on her own behalf. 6. Skin/Wound Care: Routine pressure relief measure.  7. Fluids/Electrolytes/Nutrition: Monitor I/Os.   8. HTN: Monitor BP tid--continue hydralazine and BB (also was on microzide in the past)  Vitals:   11/28/19 0001 11/28/19 0429  BP: (!) 151/47 (!) 154/51  Pulse:  64  Resp:  16  Temp:  98.3 F (36.8 C)  SpO2:  99%   Controlled Hydralazine 10mg  4 times daily, increased to 25 on 3/28- HR OK, will cont to monitor  lopressor  25 mg BID HR in 60s -ok  9. Chronic diastolic CHF/Valvular disease: Monitor weights daily with I/Os. Continue hydralazine, metoprolol, pravastatin and ASA. No ARB/ACE due to CKD.              Daily weights Filed Weights   11/26/19 0426 11/27/19 0447 11/28/19 0429  Weight: 50.1 kg 50.2 kg 51.5 kg   Fairly stable  10. Dyslipidemia: On pravastatin.  11. GERD: resume PPI.  12. Chronic constipation: Continue Senna.  Adjust bowel meds as necessary. 13. CKD III:  Monitor renal status with serial checks.    Creatinine 1.45 on 3/28- improved to 1.02 on 3/30   Continue to monitor 14. FF:MBWGYK continent Voiding 15.  Post stroke dysphagia- given               D2 thins, advance diet as tolerated 16.  Bowel incont lkely multifactorial with reduced awareness, delay in notifying nurse and difficulty getting g on bedpan   Improving 17. Anemia:   Hemoglobin 9.4 on 3/24,9.7 on 3/29, stable   Fecal OB neg , may be dilutional due to IVF 18.  Spasticity- avoid tizanidine due to hypotension, avoid baclofen due to CKD, may need BOtox as OP, Left triceps- improving , moving out of extensor syndergy pattern in elbow, also able to grasp and release   LOS: 23 days A FACE TO FACE  EVALUATION WAS PERFORMED  Erick Colace 11/28/2019, 9:13 AM

## 2019-11-28 NOTE — Progress Notes (Signed)
Occupational Therapy Session Note  Patient Details  Name: Sarah Delacruz MRN: [id number] Date of Birth: 1924/07/03  Today's Date: 11/28/2019 OT Individual Time: 0623-7628 OT Individual Time Calculation (min): 58 min    Short Term Goals: Week 3:  OT Short Term Goal 1 (Week 3): Patient will complete UB dressing with Mod A in supported sitting with no more than Mod multimodal cues. OT Short Term Goal 1 - Progress (Week 3): Met OT Short Term Goal 2 (Week 3): Patient will demonstrate dynamic sitting balance unsupported with Mod A in prep for UB bathing/dressing tasks. OT Short Term Goal 2 - Progress (Week 3): Met OT Short Term Goal 3 (Week 3): Pt will complete UB bathing in supported sitting with min assist. OT Short Term Goal 3 - Progress (Week 3): Progressing toward goal OT Short Term Goal 4 (Week 3): Patient will complete oral hygiene with Mod A seated at sink level with no more than 1-3 multimodal cues. OT Short Term Goal 4 - Progress (Week 3): Other (comment)(Patient declined oral hygiene tasks stating that she never does it at home.) Week 4:  OT Short Term Goal 1 (Week 4): Pt will complete UB bathing in supported sitting with min assist. OT Short Term Goal 2 (Week 4): Patient will complete transfers to [facility] with Mod A and Mod cues.  Skilled Therapeutic Interventions/Progress Updates:  Patient met lying supine in bed in agreement with OT treatment session. 0/10 pain at rest and with activity. Supine to EOB with Min A and increased time to bring trunk upright. Patient continent of bladder requiring Mod A for sit to stand from EOB. Patient still requires Mod-Max A for stand-pivot transfers to [facility] secondary to decreased motor planning particularly with turning requiring Mod-Max cueing. Toileting with assistance for pulling pants down and L knee block for lowering/getting up from [facility]. Patient able to wash front perineal area in sitting with set-up A. UB/LB bathing activity seated on BSC with hand  over hand assist to wash RUE. LB bathing with patient requiring assistance to wash buttocks in standing. Once standing at [facility], patient required Min A and multimodal cues to correct posterior lean necessary to maintain static standing balance. Patient continues to require Mod-Max A to thread LB clothing and hike pants over hips in standing secondary to LLE extensor tone, decreased motor planning, and decreased dynamic sitting balance although improved since initial eval. NMR re-education with focus on motor planning in LUE with grasping/releasing of cones with hand over hand assist in supported sitting position. Session concluded with patient seated in wc with call bell within reach, belt alarm activated, and all needs met.   Therapy Documentation Precautions:  Precautions Precautions: Fall, Other (comment) Precaution Comments: left inattention, extensor patterns in LEs and trunk Restrictions Weight Bearing Restrictions: No   Therapy/Group: Individual Therapy  [name] R Howerton-Davis 11/28/2019, 7:49 AM

## 2019-11-29 ENCOUNTER — Inpatient Hospital Stay (HOSPITAL_COMMUNITY): Payer: Medicare Other

## 2019-11-29 ENCOUNTER — Inpatient Hospital Stay (HOSPITAL_COMMUNITY): Payer: Medicare Other | Admitting: Physical Therapy

## 2019-11-29 NOTE — Progress Notes (Signed)
Occupational Therapy Session Note  Patient Details  Name: Sarah Delacruz MRN: [id number] Date of Birth: 19-Mar-1924  Today's Date: 11/29/2019 OT Individual Time: 7672-0947 OT Individual Time Calculation (min): 55 min    Short Term Goals: Week 1:  OT Short Term Goal 1 (Week 1): Patient will complete UB dressing with Mod A in supported sitting with no more than Mod multimodal cues. OT Short Term Goal 1 - Progress (Week 1): Not met OT Short Term Goal 2 (Week 1): Patient will demonstrate dynamic sitting balance unsupported with Mod A in prep for UB bathing/dressing tasks. OT Short Term Goal 2 - Progress (Week 1): Not met OT Short Term Goal 3 (Week 1): Patient will perform bed mobility with 1 person assist in prep for ADL tasks. OT Short Term Goal 3 - Progress (Week 1): Met OT Short Term Goal 4 (Week 1): Patient will complete sit > stand transfers with Mod A +2 helpers. OT Short Term Goal 4 - Progress (Week 1): Not met  Skilled Therapeutic Interventions/Progress Updates:    1:1. Pt received on bed pan and rolls with MOD A-MAX A to remove and cleanse buttocks. Pt able to cleanse peri area at bed level. Pt supine>sitting EOB with MOD A for trunk/LLE management. Pt sits EOB with S-CGA for bathing. HOH A provided to facilitate trunk flexion and bathing/threading of LES with LUE. A to wash B feet. Pt sit to stand with significant retropulsion while OT advances pants past hips. Pt requires overall MAX A to don shirt but able to verbalize hemi techniques. Pt stand pivot tranfser to w/c with MAX A d/t retropulsion and pt pushing RW forward. Pt completes anterior weight shifting with BUT placed on stool in front of pt in prep for transitional movmeent 1x10 with MOD A for facilitation. Exited sesion with pt seated in w/c, exit alarm on and call light in reach  Therapy Documentation Precautions:  Precautions Precautions: Fall, Other (comment) Precaution Comments: left inattention, extensor patterns in LEs  and trunk Restrictions Weight Bearing Restrictions: No General:   Vital Signs: Therapy Vitals Temp: 98.1 F (36.7 C) Pulse Rate: 68 Resp: 16 BP: (!) 117/44 Patient Position (if appropriate): Lying Oxygen Therapy SpO2: 99 % O2 Device: Room Air Pain:   ADL: ADL Grooming: Dependent(Honey thick liquids) Upper Body Bathing: Maximal cueing, Maximal assistance(Multimodal cueing for focused attention and sustained attention.) Where Assessed-Upper Body Bathing: Edge of bed Lower Body Bathing: Maximal cueing, Dependent(+2 helpers) Where Assessed-Lower Body Bathing: Edge of bed Upper Body Dressing: Maximal cueing, Maximal assistance Where Assessed-Upper Body Dressing: Edge of bed Lower Body Dressing: Maximal cueing, Dependent(+2 helpers) Where Assessed-Lower Body Dressing: Edge of bed Toileting: Dependent(Incontinent brief) Toilet Transfer: Unable to assess Toilet Transfer Method: Unable to assess Tub/Shower Transfer: Unable to assess ADL Comments: Patient with poor static/dynamic sitting balance, L inattention/pusher syndrome, and need for maximal cueing for focused and sustained attention. Vision   Perception    Praxis   Exercises:   Other Treatments:     Therapy/Group: Individual Therapy  Tonny Branch 11/29/2019, 8:49 AM

## 2019-11-29 NOTE — Progress Notes (Signed)
Physical Therapy Session Note  Patient Details  Name: Sarah Delacruz MRN: [id number] Date of Birth: May 20, 1924  Today's Date: 11/29/2019 PT Individual Time: 1027-2536 PT Individual Time Calculation (min): 62 min   Short Term Goals: Week 4:  PT Short Term Goal 1 (Week 4): = to LTGs based on ELOS  Skilled Therapeutic Interventions/Progress Updates:   Pt received sitting in w/c with her daughter, Sedalia Muta, present and pt agreeable to therapy session. Pt reports she needs to have a BM. Throughout session therapist educating pt's daughter on pt's CLOF, need for 24hr assist, pt's L inattention, proper use of gait belt, importance of having pt wear shoes to prevent her feet from sliding forward on ground, proper sequencing of transfers, importance of keeping sequencing of mobility tasks consistent, pt's extensor tone in trunk and L LE causing posterior lean/LOB as well as providing visual demonstration and verbal education throughout session on proper positioning to safely assist pt. Sit<>stands using RW with min assist for balance due to posterior lean - cuing for scooting hips forward, placing L hand on RW orthotic, and anterior weight shift. L stand pivot to BSC using RW with initially min assist for balance but mod assist when stepping backwards to get closer to [facility] due to posterior lean/LOB. Once recovered balance stood with min assist using RW while therapist performed max assist LB clothing management. Sit<>stand BSC<>RW with min assist for balance due to posterior lean and continued cuing for sequencing. Continent of bladder but unable to have BM - standing with RW and min assist for balance due to posterior lean therapist performed total assist peri-care. R stand pivot to w/c using RW with min assist for balance and max cuing for sequencing and AD management - continues to demonstrate difficulty stepping backwards with L LE during transfer. Gait training ~75ft (2 turns) using RW with min assist for balance  throughout and mod cuing for navigating room environment while therapist educated pt's daughter on proper assistance with this task. Therapist educated pt's daughter that depending on the time of day pt can require increased assistance to perform mobility tasks. Pt requesting to change into night gown. Changed clothes in sitting with max assist for clothing management and min assist for sit<>stand and standing balance using RW. Therapist educated pt's daughter on w/c parts including leg rests and w/c tray table. Pt's daughter planning to return this coming week with 3 additional family members to participate in hands-on family training. Pt left seated in w/c with needs in reach, L UE supported on w/c tray table, and seat belt alarm on.  Therapy Documentation Precautions:  Precautions Precautions: Fall, Other (comment) Precaution Comments: left inattention, extensor patterns in LEs and trunk Restrictions Weight Bearing Restrictions: No  Pain: No reports of pain throughout session.   Therapy/Group: Individual Therapy  Ginny Forth, PT, DPT 11/29/2019, 12:29 PM

## 2019-11-29 NOTE — Progress Notes (Signed)
[place] PHYSICAL MEDICINE & REHABILITATION PROGRESS NOTE   Subjective/Complaints:  Up in bed using bed pan. Had a good night. "I feel good"  ROS: Patient denies fever, rash, sore throat, blurred vision, nausea, vomiting, diarrhea, cough, shortness of breath or chest pain, joint or back pain, headache, or mood change.   Objective: No results found. No results for input(s): WBC, HGB, HCT, PLT in the last 72 hours. No results for input(s): NA, K, CL, CO2, GLUCOSE, BUN, CREATININE, CALCIUM in the last 72 hours.  Intake/Output Summary (Last 24 hours) at 11/29/2019 1126 Last data filed at 11/28/2019 1838 Gross per 24 hour  Intake 110 ml  Output 50 ml  Net 60 ml     Physical Exam: Vital Signs Blood pressure (!) 117/44, pulse 68, temperature 98.1 F (36.7 C), resp. rate 16, height 5\' 6"  (1.676 m), weight 51.3 kg, SpO2 99 %.     Constitutional: No distress . Vital signs reviewed. HEENT: EOMI, oral membranes moist Neck: supple Cardiovascular: RRR without murmur. No JVD    Respiratory/Chest: CTA Bilaterally without wheezes or rales. Normal effort    GI/Abdomen: BS +, non-tender, non-distended Ext: no clubbing, cyanosis, or edema Psych: pleasant and cooperative Skin: No evidence of breakdown, no evidence of rash Neurologic  LUE: 3- to 3/5 delt , biceps triceps , grasp and release LLE: 4-/5 HF, KE, ADF   Assessment/Plan: 1. Functional deficits secondary to Right MCA infarct  which require 3+ hours per day of interdisciplinary therapy in a comprehensive inpatient rehab setting.  Physiatrist is providing close team supervision and 24 hour management of active medical problems listed below.  Physiatrist and rehab team continue to assess barriers to discharge/monitor patient progress toward functional and medical goals  Care Tool:  Bathing    Body parts bathed by patient: Right arm, Left arm, Chest, Abdomen, Front perineal area, Buttocks, Right upper leg, Left upper leg, Left  lower leg, Face, Right lower leg   Body parts bathed by helper: Right arm, Buttocks, Right lower leg, Left lower leg Body parts n/a: Right arm, Left arm, Chest, Abdomen, Right upper leg, Left upper leg, Right lower leg, Left lower leg, Face   Bathing assist Assist Level: Maximal Assistance - Patient 24 - 49%     Upper Body Dressing/Undressing Upper body dressing   What is the patient wearing?: Pull over shirt    Upper body assist Assist Level: Moderate Assistance - Patient 50 - 74%    Lower Body Dressing/Undressing Lower body dressing      What is the patient wearing?: Incontinence brief, Pants     Lower body assist Assist for lower body dressing: Maximal Assistance - Patient 25 - 49%     Toileting Toileting    Toileting assist Assist for toileting: Moderate Assistance - Patient 50 - 74%     Transfers Chair/bed transfer  Transfers assist     Chair/bed transfer assist level: Moderate Assistance - Patient 50 - 74% Chair/bed transfer assistive device:   Ambulation assist   Ambulation activity did not occur: Safety/medical concerns(poor balance/high fall risk)  Assist level: Moderate Assistance - Patient 50 - 74% Assistive device: Walker-rolling Max distance: 30'   Walk 10 feet activity   Assist  Walk 10 feet activity did not occur: Safety/medical concerns(poor balance/high fall risk)  Assist level: Moderate Assistance - Patient - 50 - 74% Assistive device: Walker-rolling   Walk 50 feet activity   Assist Walk 50 feet with 2 turns activity  did not occur: Safety/medical concerns(poor balance/high fall risk)  Assist level: Moderate Assistance - Patient - 50 - 74% Assistive device: Walker-rolling    Walk 150 feet activity   Assist Walk 150 feet activity did not occur: Safety/medical concerns(poor balance/high fall risk)  Assist level: Moderate Assistance - Patient - 50 - 74% Assistive device: Walker-rolling    Walk  10 feet on uneven surface  activity   Assist Walk 10 feet on uneven surfaces activity did not occur: Safety/medical concerns(poor balance/high fall risk)         Wheelchair     Assist Will patient use wheelchair at discharge?: Yes Type of Wheelchair: Manual    Wheelchair assist level: Dependent - Patient 0% Max wheelchair distance: 150 ft    Wheelchair 50 feet with 2 turns activity    Assist        Assist Level: Dependent - Patient 0%   Wheelchair 150 feet activity     Assist      Assist Level: Dependent - Patient 0%   Blood pressure (!) 117/44, pulse 68, temperature 98.1 F (36.7 C), resp. rate 16, height 5\' 6"  (1.676 m), weight 51.3 kg, SpO2 99 %.   Medical Problem List and Plan: 1.  Left hemiparesis with sensory deficits, L-HH with right inattention, pusher tendencies to the left/back, severe dysarthria secondary to right MCA infarct.  Continue CIR PT, OT, SLP  Good progress described by OT  2.  Antithrombotics: -DVT/anticoagulation:  Pharmaceutical: Lovenox             -antiplatelet therapy: ASA daily.  3. Pain Management: Tylenol prn.  4. Mood: LCSW to follow for evaluation and support.              -antipsychotic agents: N/A 5. Neuropsych: This patient is? capable of making decisions on her own behalf. 6. Skin/Wound Care: Routine pressure relief measure.  7. Fluids/Electrolytes/Nutrition: Monitor I/Os.   8. HTN:   Vitals:   11/29/19 0254 11/29/19 0500  BP: (!) 116/42 (!) 117/44  Pulse: 68 68  Resp: 16 16  Temp: 98.1 F (36.7 C) 98.1 F (36.7 C)  SpO2: 98% 99%   Controlled Hydralazine 10mg  4 times daily, increased to 25 on 3/28- HR OK, will cont to monitor  lopressor  25 mg BID HR in 60s  4/3 bp well controlled 9. Chronic diastolic CHF/Valvular disease: Monitor weights daily with I/Os. Continue hydralazine, metoprolol, pravastatin and ASA. No ARB/ACE due to CKD.              Daily weights Filed Weights   11/27/19 0447 11/28/19 0429  11/29/19 0500  Weight: 50.2 kg 51.5 kg 51.3 kg   4/3 monitor for further weight increase this weekend 10. Dyslipidemia: On pravastatin.  11. GERD: resume PPI.  12. Chronic constipation: Continue Senna.  Adjust bowel meds as necessary. 13. CKD III: Monitor renal status with serial checks.    Creatinine 1.45 on 3/28- improved to 1.02 on 3/30   Continue to monitor 14. 4/28 continent Voiding 15.  Post stroke dysphagia- given               D2 thins, advance diet as tolerated 16.  Bowel incont lkely multifactorial with reduced awareness, delay in notifying nurse and difficulty getting g on bedpan   Improving 17. Anemia:   Hemoglobin 9.4 on 3/24,9.7 on 3/29, stable   Fecal OB neg , may be dilutional due to IVF 18.  Spasticity- avoid tizanidine due to hypotension, avoid baclofen due to  CKD, may need BOtox as OP, Left triceps- improving , moving out of extensor syndergy pattern in elbow, also able to grasp and release   LOS: 24 days A FACE TO FACE EVALUATION WAS PERFORMED  Meredith Staggers 11/29/2019, 11:26 AM

## 2019-11-30 ENCOUNTER — Inpatient Hospital Stay (HOSPITAL_COMMUNITY): Payer: Medicare Other

## 2019-11-30 NOTE — Progress Notes (Signed)
Occupational Therapy Session Note  Patient Details  Name: Sarah Delacruz MRN: [id number] Date of Birth: Jun 26, 1924  Today's Date: 11/30/2019 OT Individual Time: 1100-1200 OT Individual Time Calculation (min): 60 min    Short Term Goals: Week 1:  OT Short Term Goal 1 (Week 1): Patient will complete UB dressing with Mod A in supported sitting with no more than Mod multimodal cues. OT Short Term Goal 1 - Progress (Week 1): Not met OT Short Term Goal 2 (Week 1): Patient will demonstrate dynamic sitting balance unsupported with Mod A in prep for UB bathing/dressing tasks. OT Short Term Goal 2 - Progress (Week 1): Not met OT Short Term Goal 3 (Week 1): Patient will perform bed mobility with 1 person assist in prep for ADL tasks. OT Short Term Goal 3 - Progress (Week 1): Met OT Short Term Goal 4 (Week 1): Patient will complete sit > stand transfers with Mod A +2 helpers. OT Short Term Goal 4 - Progress (Week 1): Not met  Skilled Therapeutic Interventions/Progress Updates:    OT session focused on functional transfers, L NMR, and visual scanning. Pt received sitting in w/c. Provided AAROM to LUE focusing flexion/extension of shoulder, elbow, and digits. Completed ball raises 4x 6 reps with mod-max assist for placement of R hand. OT provided verbal cues for visual attention to LUE during activity. Completed visual scanning activity requiring pt to transfer small items left and right of midine. Moderate difficulty noted as client completed with 65% accuracy. Competed stand pivot transfer bed<>w/c with mod A and cues for technique and sequencing. Pt sat EOB for 15 minutes with mild posterior lean requiring cues to correct. Completed functional reach 3x 10 reps with RUE crossing midine facilitating weight bearing through LUE, visual skills, and anterior weight shift. Completed sit<>stand x10 with min A and emphasizing anterior weight shift while pushing through BUEs. At end of session, pt left sitting in w/c  with belt and all needs in reach. Daughter present with OT encouraging sitting on pt's left side to increase awareness.   Therapy Documentation Precautions:  Precautions Precautions: Fall, Other (comment) Precaution Comments: left inattention, extensor patterns in LEs and trunk Restrictions Weight Bearing Restrictions: No General:   Vital Signs: Therapy Vitals Pulse Rate: 87 BP: (!) 118/57 Pain: Pain Assessment Pain Scale: 0-10 Pain Score: 0-No pain ADL: ADL Grooming: Dependent(Honey thick liquids) Upper Body Bathing: Maximal cueing, Maximal assistance(Multimodal cueing for focused attention and sustained attention.) Where Assessed-Upper Body Bathing: Edge of bed Lower Body Bathing: Maximal cueing, Dependent(+2 helpers) Where Assessed-Lower Body Bathing: Edge of bed Upper Body Dressing: Maximal cueing, Maximal assistance Where Assessed-Upper Body Dressing: Edge of bed Lower Body Dressing: Maximal cueing, Dependent(+2 helpers) Where Assessed-Lower Body Dressing: Edge of bed Toileting: Dependent(Incontinent brief) Toilet Transfer: Unable to assess Toilet Transfer Method: Unable to assess Tub/Shower Transfer: Unable to assess ADL Comments: Patient with poor static/dynamic sitting balance, L inattention/pusher syndrome, and need for maximal cueing for focused and sustained attention. Vision   Perception    Praxis   Exercises:   Other Treatments:     Therapy/Group: Individual Therapy  Duayne Cal 11/30/2019, 11:22 AM

## 2019-11-30 NOTE — Progress Notes (Signed)
Physical Therapy Session Note  Patient Details  Name: Sarah Delacruz MRN: [id number] Date of Birth: 07/20/1924  Today's Date: 11/30/2019 PT Individual Time: 0800-0910 PT Individual Time Calculation (min): 70 min   Short Term Goals: Week 4:  PT Short Term Goal 1 (Week 4): = to LTGs based on ELOS  Skilled Therapeutic Interventions/Progress Updates:    Pt supine in bed upon PT arrival, agreeable to therapy tx and denies pain. Pt asking to get dressed and wash her face. Pt transferred to sitting EOB with min assist, cues for techniques, increased time to complete. While sitting EOB pt performed upper and lower body dressing, initial posterior lean but able to correct. Total assist to don shoes. Assist to loop legs through pants, sit<>stand mod assist with RW to pull pants over hips. Min assist to don shirt. Stand pivot to w/c with mod assist, cues for techniques and sequencing. Pt transported to the gym. Pt ambulated 2 x 60 ft with x 2 turns each time working on gait and turns, using RW and min assist with increased cueing for turns. Pt performed x 8 sit<>stands this session with min assist overall with emphasis on sequencing and techniques. Pt worked on standing balance while participating in horseshoe toss activity, UE support on RW. Pt transported back to room at end of session, needs in reach and chair alarm set.    Therapy Documentation Precautions:  Precautions Precautions: Fall, Other (comment) Precaution Comments: left inattention, extensor patterns in LEs and trunk Restrictions Weight Bearing Restrictions: No    Therapy/Group: Individual Therapy  Cresenciano Genre, PT, DPT, CSRS 11/30/2019, 8:07 AM

## 2019-12-01 ENCOUNTER — Inpatient Hospital Stay (HOSPITAL_COMMUNITY): Payer: Medicare Other

## 2019-12-01 ENCOUNTER — Inpatient Hospital Stay (HOSPITAL_COMMUNITY): Payer: Medicare Other | Admitting: Speech Pathology

## 2019-12-01 ENCOUNTER — Inpatient Hospital Stay (HOSPITAL_COMMUNITY): Payer: Medicare Other | Admitting: Occupational Therapy

## 2019-12-01 LAB — CBC
HCT: 31.1 % — ABNORMAL LOW (ref 36.0–46.0)
Hemoglobin: 9.8 g/dL — ABNORMAL LOW (ref 12.0–15.0)
MCH: 29.6 pg (ref 26.0–34.0)
MCHC: 31.5 g/dL (ref 30.0–36.0)
MCV: 94 fL (ref 80.0–100.0)
Platelets: 276 10*3/uL (ref 150–400)
RBC: 3.31 MIL/uL — ABNORMAL LOW (ref 3.87–5.11)
RDW: 13.9 % (ref 11.5–15.5)
WBC: 6.8 10*3/uL (ref 4.0–10.5)
nRBC: 0 % (ref 0.0–0.2)

## 2019-12-01 NOTE — Progress Notes (Signed)
Occupational Therapy Session Note  Patient Details  Name: Mariaha Ellington MRN: [id number] Date of Birth: [birth year]  Today's Date: 12/01/2019 OT Individual Time: 0904-1000 OT Individual Time Calculation (min): 56 min    Short Term Goals: Week 3:  OT Short Term Goal 1 (Week 3): Patient will complete UB dressing with Mod A in supported sitting with no more than Mod multimodal cues. OT Short Term Goal 1 - Progress (Week 3): Met OT Short Term Goal 2 (Week 3): Patient will demonstrate dynamic sitting balance unsupported with Mod A in prep for UB bathing/dressing tasks. OT Short Term Goal 2 - Progress (Week 3): Met OT Short Term Goal 3 (Week 3): Pt will complete UB bathing in supported sitting with min assist. OT Short Term Goal 3 - Progress (Week 3): Progressing toward goal OT Short Term Goal 4 (Week 3): Patient will complete oral hygiene with Mod A seated at sink level with no more than 1-3 multimodal cues. OT Short Term Goal 4 - Progress (Week 3): Other (comment)(Patient declined oral hygiene tasks stating that she never does it at home.) Week 4:  OT Short Term Goal 1 (Week 4): Pt will complete UB bathing in supported sitting with min assist. OT Short Term Goal 2 (Week 4): Patient will complete transfers to [facility] with Mod A and Mod cues.  Skilled Therapeutic Interventions/Progress Updates:  Patient met lying supine in bed in agreement with OT treatment session. Supine to EOB with Mod A to bring trunk upright and to advance BLE from bed surface to EOB secondary to morning stiffness. Patient required Min A for sit to stand from EOB with use of RW and cueing for hand placement, power negotiation, foot placement, and sequencing. Toilet transfer, toileting/hygiene/clothing management with Mod A on BSC. Once standing, patient requires only Min guard and cues for posture to maintain static standing balance for hygiene/clothing management with BUE supported on RW. Bathing seated on commode with Mod A and  cueing to bathe UB. Patient continues to require Mod-Max A to bathe LB secondary to extensor tone in LLE and decreased sitting balance although improved since initial eval. LB dressing with Max A to thread BLE and to hike pants over hips in standing. Patient unable to attain figure-4 position secondary to stiffness requiring total A to don footwear. Session concluded with patient seated in wc with belt alarm activated, call bell within reach and all needs met.      Therapy Documentation Precautions:  Precautions Precautions: Fall, Other (comment) Precaution Comments: left inattention, extensor patterns in LEs and trunk Restrictions Weight Bearing Restrictions: No   Therapy/Group: Individual Therapy  Vega Withrow R Howerton-Davis 12/01/2019, 7:41 AM

## 2019-12-01 NOTE — Progress Notes (Signed)
Speech Language Pathology Daily Session Note  Patient Details  Name: Sarah Delacruz MRN: [id number] Date of Birth: Oct 18, 1923  Today's Date: 12/01/2019 SLP Individual Time: 0730-0800 SLP Individual Time Calculation (min): 30 min  Short Term Goals: Week 4: SLP Short Term Goal 1 (Week 4): STGs=LTGs due to ELOS  Skilled Therapeutic Interventions: Skilled treatment session focused on dysphagia and speech goals. Upon arrival, patient requested to use the bedpan. Patient required Min verbal cues for problem solving with task but was continent of bladder. SLP facilitated session by providing Min-Mod A verbal cues to self-monitor and correct left anterior spillage and pocketing of Dys. 2 textures. Although patient required increased liquid washes, no overt s/s of aspiration were noted. Therefore, recommend patient continue current diet. Patient verbalized at both the sentence level with supervision verbal cues needed for use of speech intelligibility strategies to achieve ~80% intelligibility. Patient left upright in bed with alarm on and NT present. Continue with current plan of care.     Pain No/Denies Pain  Therapy/Group: Individual Therapy  Sharlyne Koeneman 12/01/2019, 12:41 PM

## 2019-12-01 NOTE — Progress Notes (Signed)
Physical Therapy Session Note  Patient Details  Name: Sarah Delacruz MRN: [id number] Date of Birth: [birth year]  Today's Date: 12/01/2019 PT Individual Time: 1761-6073 PT Individual Time Calculation (min): 43 min    Short Term Goals: Week 4:  PT Short Term Goal 1 (Week 4): = to LTGs based on ELOS  Skilled Therapeutic Interventions/Progress Updates:    Pt seated in w/c eating lunch upon PT arrival, agreeable to therapy tx and denies pain. Pt reports she is about through with eating, therapist provided supervision for last few bites with cues as needed. Pt transported to the gym in w/c. This session pt worked on ambulation in the home environment while ambulating in the rehab apartment from w/c>recliner x 15 ft with RW and then from recliner>couch x 15 ft and then to w/c x 5 ft, increased cues/assist needed with turns for foot placement and RW management. Pt requiring mod assist for sit<>stands from furniture and min assist sit<>stands from her w/c, with cues for sequencing/techniques. Pt transported to ortho gym this session. Ambulated 2 x 10 ft from car with min assist and RW, mod assist needed for car transfer to turn, to help with scooting/bringing legs into car, and for sit<>stand from car. Pt transported back to room, requesting to doff stockings/shoes, therapist performed this total assist. Pt requesting to get back in bed. Pt performed stand pivot from w/c>bed with RW and mod assist, cues for techniques. Sit>supine with min assist, left supine with needs in reach and bed alarm set.   Therapy Documentation Precautions:  Precautions Precautions: Fall, Other (comment) Precaution Comments: left inattention, extensor patterns in LEs and trunk Restrictions Weight Bearing Restrictions: No    Therapy/Group: Individual Therapy  Cresenciano Genre, PT, DPT, CSRS 12/01/2019, 1:00 PM

## 2019-12-01 NOTE — Progress Notes (Signed)
[place] PHYSICAL MEDICINE & REHABILITATION PROGRESS NOTE   Subjective/Complaints:  No pains, discussed D/C date Pt eating well   ROS: Patient denies CP,SOB. N/V/D Objective: No results found. Recent Labs    12/01/19 0545  WBC 6.8  HGB 9.8*  HCT 31.1*  PLT 276   No results for input(s): NA, K, CL, CO2, GLUCOSE, BUN, CREATININE, CALCIUM in the last 72 hours.  Intake/Output Summary (Last 24 hours) at 12/01/2019 0847 Last data filed at 12/01/2019 0800 Gross per 24 hour  Intake 460 ml  Output 200 ml  Net 260 ml     Physical Exam: Vital Signs Blood pressure (!) 133/51, pulse 62, temperature 98 F (36.7 C), resp. rate 16, height 5\' 6"  (1.676 m), weight 47.6 kg, SpO2 97 %.   General: No acute distress Mood and affect are appropriate Heart: Regular rate and rhythm no rubs murmurs or extra sounds Lungs: Clear to auscultation, breathing unlabored, no rales or wheezes Abdomen: Positive bowel sounds, soft nontender to palpation, nondistended Extremities: No clubbing, cyanosis, or edema Skin: No evidence of breakdown, no evidence of rash   LUE: 3- to 3/5 delt , biceps triceps , grasp and release LLE: 4-/5 HF, KE, ADF   Assessment/Plan: 1. Functional deficits secondary to Right MCA infarct  which require 3+ hours per day of interdisciplinary therapy in a comprehensive inpatient rehab setting.  Physiatrist is providing close team supervision and 24 hour management of active medical problems listed below.  Physiatrist and rehab team continue to assess barriers to discharge/monitor patient progress toward functional and medical goals  Care Tool:  Bathing    Body parts bathed by patient: Right arm, Left arm, Chest, Abdomen, Front perineal area, Buttocks, Right upper leg, Left upper leg, Left lower leg, Face, Right lower leg   Body parts bathed by helper: Right arm, Buttocks, Right lower leg, Left lower leg Body parts n/a: Right arm, Left arm, Chest, Abdomen, Right upper leg,  Left upper leg, Right lower leg, Left lower leg, Face   Bathing assist Assist Level: Maximal Assistance - Patient 24 - 49%     Upper Body Dressing/Undressing Upper body dressing   What is the patient wearing?: Pull over shirt    Upper body assist Assist Level: Moderate Assistance - Patient 50 - 74%    Lower Body Dressing/Undressing Lower body dressing      What is the patient wearing?: Incontinence brief, Pants     Lower body assist Assist for lower body dressing: Maximal Assistance - Patient 25 - 49%     Toileting Toileting    Toileting assist Assist for toileting: Moderate Assistance - Patient 50 - 74%     Transfers Chair/bed transfer  Transfers assist     Chair/bed transfer assist level: Minimal Assistance - Patient > 75% Chair/bed transfer assistive device: Walker,   Ambulation assist   Ambulation activity did not occur: Safety/medical concerns(poor balance/high fall risk)  Assist level: Minimal Assistance - Patient > 75% Assistive device: Walker-rolling Max distance: 60 ft   Walk 10 feet activity   Assist  Walk 10 feet activity did not occur: Safety/medical concerns(poor balance/high fall risk)  Assist level: Minimal Assistance - Patient > 75% Assistive device: Walker-rolling   Walk 50 feet activity   Assist Walk 50 feet with 2 turns activity did not occur: Safety/medical concerns(poor balance/high fall risk)  Assist level: Minimal Assistance - Patient > 75% Assistive device: Walker-rolling    Walk 150 feet activity  Assist Walk 150 feet activity did not occur: Safety/medical concerns(poor balance/high fall risk)  Assist level: Moderate Assistance - Patient - 50 - 74% Assistive device: Walker-rolling    Walk 10 feet on uneven surface  activity   Assist Walk 10 feet on uneven surfaces activity did not occur: Safety/medical concerns(poor balance/high fall risk)         Wheelchair     Assist  Will patient use wheelchair at discharge?: Yes Type of Wheelchair: Manual    Wheelchair assist level: Dependent - Patient 0% Max wheelchair distance: 150 ft    Wheelchair 50 feet with 2 turns activity    Assist        Assist Level: Dependent - Patient 0%   Wheelchair 150 feet activity     Assist      Assist Level: Dependent - Patient 0%   Blood pressure (!) 133/51, pulse 62, temperature 98 F (36.7 C), resp. rate 16, height 5\' 6"  (1.676 m), weight 47.6 kg, SpO2 97 %.   Medical Problem List and Plan: 1.  Left hemiparesis with sensory deficits, L-HH with right inattention, pusher tendencies to the left/back, severe dysarthria secondary to right MCA infarct.  Continue CIR PT, OT, SLP improved LUE strength  2.  Antithrombotics: -DVT/anticoagulation:  Pharmaceutical: Lovenox             -antiplatelet therapy: ASA daily.  3. Pain Management: Tylenol prn.  4. Mood: LCSW to follow for evaluation and support.              -antipsychotic agents: N/A 5. Neuropsych: This patient is? capable of making decisions on her own behalf. 6. Skin/Wound Care: Routine pressure relief measure.  7. Fluids/Electrolytes/Nutrition: Monitor I/Os.   8. HTN:   Vitals:   12/01/19 0631 12/01/19 0739  BP: (!) 132/45 (!) 133/51  Pulse: 70 62  Resp:    Temp:    SpO2:     Controlled Hydralazine 25mg  4 times daily, lopressor  25 mg BID HR in 60s  4/5 bp well controlled 9. Chronic diastolic CHF/Valvular disease: Monitor weights daily with I/Os. Continue hydralazine, metoprolol, pravastatin and ASA. No ARB/ACE due to CKD.              Daily weights Filed Weights   11/29/19 0500 11/30/19 0333 12/01/19 0432  Weight: 51.3 kg 50.6 kg 47.6 kg   4/3 monitor for further weight increase this weekend 10. Dyslipidemia: On pravastatin.  11. GERD: resume PPI.  12. Chronic constipation: Continue Senna.  Adjust bowel meds as necessary. 13. CKD III: Monitor renal status with serial checks.    Creatinine  1.45 on 3/28- improved to 1.02 on 3/30   Oral intake low , repeat BMET off IVF on 4/7  14. HC:WCBJSE continent Voiding 15.  Post stroke dysphagia- given               D2 thins, advance diet as tolerated 16.  Bowel incont lkely multifactorial with reduced awareness, delay in notifying nurse and difficulty getting g on bedpan   Improving 17. Anemia:   Hemoglobin 9.4 on 3/24,9.7 on 3/29, 9.8 on 4/5  Fecal OB neg , may be dilutional due to IVF 18.  Spasticity-improving  avoid tizanidine due to hypotension, avoid baclofen due to CKD, may need BOtox as OP, Left triceps- improving , moving out of extensor syndergy pattern in elbow, also able to grasp and release   LOS: 26 days A FACE TO FACE EVALUATION WAS PERFORMED  Charlett Blake 12/01/2019, 8:47  AM    

## 2019-12-02 ENCOUNTER — Encounter (HOSPITAL_COMMUNITY): Payer: Medicare Other | Admitting: Occupational Therapy

## 2019-12-02 ENCOUNTER — Encounter (HOSPITAL_COMMUNITY): Payer: Medicare Other | Admitting: Speech Pathology

## 2019-12-02 ENCOUNTER — Inpatient Hospital Stay (HOSPITAL_COMMUNITY): Payer: Medicare Other | Admitting: Speech Pathology

## 2019-12-02 ENCOUNTER — Ambulatory Visit (HOSPITAL_COMMUNITY): Payer: Medicare Other | Admitting: Physical Therapy

## 2019-12-02 MED ORDER — HYDRALAZINE HCL 25 MG PO TABS
25.0000 mg | ORAL_TABLET | Freq: Three times a day (TID) | ORAL | Status: DC
  Administered 2019-12-03 – 2019-12-04 (×4): 25 mg via ORAL
  Filled 2019-12-02 (×5): qty 1

## 2019-12-02 NOTE — Progress Notes (Signed)
[place] PHYSICAL MEDICINE & REHABILITATION PROGRESS NOTE   Subjective/Complaints:  No c/o SLP readministering Cognistat  ROS: Patient denies CP,SOB. N/V/D Objective: No results found. Recent Labs    12/01/19 0545  WBC 6.8  HGB 9.8*  HCT 31.1*  PLT 276   No results for input(s): NA, K, CL, CO2, GLUCOSE, BUN, CREATININE, CALCIUM in the last 72 hours.  Intake/Output Summary (Last 24 hours) at 12/02/2019 0829 Last data filed at 12/01/2019 1816 Gross per 24 hour  Intake 300 ml  Output --  Net 300 ml     Physical Exam: Vital Signs Blood pressure (!) 107/50, pulse 66, temperature 98.2 F (36.8 C), temperature source Oral, resp. rate 18, height 5\' 6"  (1.676 m), weight 47.6 kg, SpO2 100 %.    General: No acute distress Mood and affect are appropriate Heart: Regular rate and rhythm no rubs murmurs or extra sounds Lungs: Clear to auscultation, breathing unlabored, no rales or wheezes Abdomen: Positive bowel sounds, soft nontender to palpation, nondistended Extremities: No clubbing, cyanosis, or edema  Musculoskeletal: Full range of motion in all 4 extremities. No joint swelling    LUE: 3- to 3/5 delt , biceps triceps , grasp and release LLE: 4-/5 HF, KE, ADF   Assessment/Plan: 1. Functional deficits secondary to Right MCA infarct  which require 3+ hours per day of interdisciplinary therapy in a comprehensive inpatient rehab setting.  Physiatrist is providing close team supervision and 24 hour management of active medical problems listed below.  Physiatrist and rehab team continue to assess barriers to discharge/monitor patient progress toward functional and medical goals  Care Tool:  Bathing    Body parts bathed by patient: Chest, Abdomen, Front perineal area, Buttocks, Right upper leg, Left upper leg, Left lower leg, Face, Right lower leg, Left arm   Body parts bathed by helper: Buttocks, Right arm Body parts n/a: Right arm, Left arm, Chest, Abdomen, Right upper  leg, Left upper leg, Right lower leg, Left lower leg, Face   Bathing assist Assist Level: Moderate Assistance - Patient 50 - 74%     Upper Body Dressing/Undressing Upper body dressing   What is the patient wearing?: Pull over shirt    Upper body assist Assist Level: Moderate Assistance - Patient 50 - 74%    Lower Body Dressing/Undressing Lower body dressing      What is the patient wearing?: Incontinence brief, Pants     Lower body assist Assist for lower body dressing: Maximal Assistance - Patient 25 - 49%     Toileting Toileting    Toileting assist Assist for toileting: Moderate Assistance - Patient 50 - 74%     Transfers Chair/bed transfer  Transfers assist     Chair/bed transfer assist level: Moderate Assistance - Patient 50 - 74% Chair/bed transfer assistive device: Walker, Clinical biochemist   Ambulation assist   Ambulation activity did not occur: Safety/medical concerns(poor balance/high fall risk)  Assist level: Minimal Assistance - Patient > 75% Assistive device: Walker-rolling Max distance: 15 ft   Walk 10 feet activity   Assist  Walk 10 feet activity did not occur: Safety/medical concerns(poor balance/high fall risk)  Assist level: Minimal Assistance - Patient > 75% Assistive device: Walker-rolling   Walk 50 feet activity   Assist Walk 50 feet with 2 turns activity did not occur: Safety/medical concerns(poor balance/high fall risk)  Assist level: Minimal Assistance - Patient > 75% Assistive device: Walker-rolling    Walk 150 feet activity   Assist Walk 150  feet activity did not occur: Safety/medical concerns(poor balance/high fall risk)  Assist level: Moderate Assistance - Patient - 50 - 74% Assistive device: Walker-rolling    Walk 10 feet on uneven surface  activity   Assist Walk 10 feet on uneven surfaces activity did not occur: Safety/medical concerns(poor balance/high fall risk)          Wheelchair     Assist Will patient use wheelchair at discharge?: Yes Type of Wheelchair: Manual    Wheelchair assist level: Dependent - Patient 0% Max wheelchair distance: 150 ft    Wheelchair 50 feet with 2 turns activity    Assist        Assist Level: Dependent - Patient 0%   Wheelchair 150 feet activity     Assist      Assist Level: Dependent - Patient 0%   Blood pressure (!) 107/50, pulse 66, temperature 98.2 F (36.8 C), temperature source Oral, resp. rate 18, height 5\' 6"  (1.676 m), weight 47.6 kg, SpO2 100 %.   Medical Problem List and Plan: 1.  Left hemiparesis with sensory deficits, L-HH with right inattention, pusher tendencies to the left/back, severe dysarthria secondary to right MCA infarct.  Continue CIR PT, OT, SLP team conf in am  2.  Antithrombotics: -DVT/anticoagulation:  Pharmaceutical: Lovenox             -antiplatelet therapy: ASA daily.  3. Pain Management: Tylenol prn.  4. Mood: LCSW to follow for evaluation and support.              -antipsychotic agents: N/A 5. Neuropsych: This patient is? capable of making decisions on her own behalf. 6. Skin/Wound Care: Routine pressure relief measure.  7. Fluids/Electrolytes/Nutrition: Monitor I/Os.   8. HTN:   Vitals:   12/02/19 0632 12/02/19 0754  BP: (!) 136/49 (!) 107/50  Pulse: 65 66  Resp: 18   Temp: 98.2 F (36.8 C)   SpO2: 100%    Controlled Hydralazine 25mg  4 times daily, lopressor  25 mg BID HR in 60s  4/6 bp well controlled in fact on low side reduce Hyralazine to TID  9. Chronic diastolic CHF/Valvular disease: Monitor weights daily with I/Os. Continue hydralazine, metoprolol, pravastatin and ASA. No ARB/ACE due to CKD.              Daily weights Filed Weights   11/29/19 0500 11/30/19 0333 12/01/19 0432  Weight: 51.3 kg 50.6 kg 47.6 kg  4/5 down, ? Technique  10. Dyslipidemia: On pravastatin.  11. GERD: resume PPI.  12. Chronic constipation: Continue Senna.  Adjust bowel  meds as necessary. 13. CKD III: Monitor renal status with serial checks.    Creatinine 1.45 on 3/28- improved to 1.02 on 3/30   Oral intake low , repeat BMET off IVF on 4/7  14. 4/30 continent Voiding 15.  Post stroke dysphagia- given               D2 thins, advance diet as tolerated 16.  Bowel incont lkely multifactorial with reduced awareness, delay in notifying nurse and difficulty getting g on bedpan   Improving 17. Anemia:   Hemoglobin 9.4 on 3/24,9.7 on 3/29, 9.8 on 4/5  Fecal OB neg , may be dilutional due to IVF 18.  Spasticity-improving  avoid tizanidine due to hypotension, avoid baclofen due to CKD, may need BOtox as OP, Left triceps- improving , moving out of extensor syndergy pattern in elbow, also able to grasp and release   LOS: 27 days A FACE TO  FACE EVALUATION WAS PERFORMED  Erick Colace 12/02/2019, 8:29 AM

## 2019-12-02 NOTE — Progress Notes (Signed)
Physical Therapy Session Note  Patient Details  Name: Sarah Delacruz MRN: [id number] Date of Birth: 1924/04/02  Today's Date: 12/02/2019 PT Individual Time: 1435-1526 PT Individual Time Calculation (min): 51 min   Short Term Goals: Week 3:  PT Short Term Goal 1 (Week 3): Pt will perform supine<>sit with min assist PT Short Term Goal 1 - Progress (Week 3): Met PT Short Term Goal 2 (Week 3): Pt will perform sit<>stand using LRAD with mod assist of 1 PT Short Term Goal 2 - Progress (Week 3): Met PT Short Term Goal 3 (Week 3): Pt will perform bed<>chair transfers using LRAD with mod assist of 1 person PT Short Term Goal 3 - Progress (Week 3): Met PT Short Term Goal 4 (Week 3): Pt will ambulate at least 80f using LRAD with mod assist of 1 person PT Short Term Goal 4 - Progress (Week 3): Met  Skilled Therapeutic Interventions/Progress Updates:   Pt received sitting in w/c with her 4 family members present for hands-on family training/education and pt agreeable to therapy session. Therapist educated pt's family on her need for 24hr assistance, pt's CLOF, need to use RW for all standing/ambulating, pt wearing shoes to help with traction, proper use of gait belt, proper positioning and hand placement for assisting pt, and started to introduce w/c parts. Therapist educated pt's family on proper sequencing of transfers to keep consistency for pt. Sit>stand w/c>RW with min assist for lifting/balance due to slight posterior lean. R stand pivot to EOB using RW with min assist for balance and cuing for sequencing while therapist educated pt's family on proper sequencing/assist. Pt reports need to use bathroom. R stand pivot EOB>BSC using RW with min assist for balance and continued cuing for proper sequencing of AD and stepping. Standing with min assist for balance while therapist performed max assist LB clothing management. Continent of bladder. Sit>stand BSC>RW with mod assist for lifting into standing due to  posterior lean - therapist educated pt's family on her intermittent need for increased assist due to posterior lean. Standing with min assist for balance performed peri-care total assist and LB clothing management max/total assist. L stand pivot BSC>EOB using RW with min assist for balance and continued education to pt's family on proper sequencing assist. Gait training ~333fin room using RW with min assist for balance and primarily for AD management while educating pt's family on the need for their full attention during ambulation and ensuring adequate LLE step length during swing and their assist for AD management. Pt performed L stand pivot transfer EOB>w/c using RW with pt's daughter, DiShauna Hughproviding hands-on assist with min cuing from therapist. Ambulated ~3022fsing RW with pt's daughter providing proper hands-on assist with min cuing from therapist. Pt's daughter reports feeling confident in providing pt assistance but does plan to return for additional hands-on family training tomorrow. Pt left seated in w/c with needs in reach and seat belt alarm on.   Therapy Documentation Precautions:  Precautions Precautions: Fall, Other (comment) Precaution Comments: left inattention, extensor patterns in LEs and trunk Restrictions Weight Bearing Restrictions: No  Pain:  No reports of pain throughout session.   Therapy/Group: Individual Therapy  CarTawana ScaleT, DPT 12/02/2019, 1:01 PM

## 2019-12-02 NOTE — Progress Notes (Signed)
Occupational Therapy Session Note  Patient Details  Name: Sarah Delacruz MRN: [id number] Date of Birth: 06/13/1924  Today's Date: 12/02/2019 OT Individual Time: 1300-1343 OT Individual Time Calculation (min): 43 min    Short Term Goals: Week 4:  OT Short Term Goal 1 (Week 4): Pt will complete UB bathing in supported sitting with min assist. OT Short Term Goal 2 (Week 4): Patient will complete transfers to [facility] with Mod A and Mod cues.  Skilled Therapeutic Interventions/Progress Updates:  Patient met lying supine in bed and in agreement with OT treatment session. Daughter not present for family education. Skilled OT services with focus on self-care re-education as detailed below. Patient able to transition from supine to EOB with Min A to bring trunk upright and Min cueing for sequencing secondary to poor motor planning. Sit to stand from EOB to RW with Min A with cues for forward lean, hand placement and foot placement. Patient performed stand-pivot transfer to R with CGA-Min A and stand to sit in wc with Min A for controlled descent. Arrival of lunch tray with patient requiring set-up assist and supervision for completion of meal. Patient in agreement with toileting completing sit to stand from wc CGA-Min A. Ambulation with RW to commode in bathroom with BSC over top with Min A progressing to [place]. Patient completed toilet transfer and toileting/hygiene/clothing management with Min-Mod A for clothing management in standing. Patient appears to function with less assistance required in afternoon as she is very stiff in A.M. Session concluded with patient seated in wc with call bell within reach and SLP present.   Therapy Documentation Precautions:  Precautions Precautions: Fall, Other (comment) Precaution Comments: left inattention, extensor patterns in LEs and trunk Restrictions Weight Bearing Restrictions: No   Therapy/Group: Individual Therapy  Demarlo Riojas R [place] 12/02/2019, 7:42 AM

## 2019-12-02 NOTE — Care Management (Signed)
Patient ID: Sarah Delacruz, female   DOB: 10/14/1923, 86 y.o.   MRN: [id number] Family here for family education and asking questions about the patient and her stroke. Deferred questions to the medical team. Marissa Nestle PA, notified of family's request for information/questions.

## 2019-12-02 NOTE — Progress Notes (Signed)
Speech Language Pathology Daily Session Notes  Patient Details  Name: Sarah Delacruz MRN: [id number] Date of Birth: June 30, 1924  Today's Date: 12/02/2019  Session 1: SLP Individual Time: 0017-4944 SLP Individual Time Calculation (min): 25 min   Session 2: SLP Individual Time: 1345-1410 SLP Individual Time Calculation (min): 25 min  Short Term Goals: Week 4: SLP Short Term Goal 1 (Week 4): STGs=LTGs due to ELOS  Skilled Therapeutic Interventions:  Session 1: Skilled treatment session focused on cognitive goals. SLP facilitated session by re-administering the Cognistat. Patient scored WFL on all subtests with the exception of mild impairments in orientation and attention and severe impairments in short-term recall and calculations. Overall, patient demonstrated improvement in all cognitive domains since initial evaluation.   Session 2: Skilled treatment session focused on completion of family education with the patient, her daughters and granddaughters. SLP facilitated session by providing education in regards to patient's current swallowing function, diet recommendations, compensatory strategies and medication administration. They were also educated on her speech intelligibility strategies and how to implement strategies at home. Education was also provided in regards to patient's current cognitive deficits and strategies to utilize at home to maximize attention, visual scanning to the left, recall with use of strategies and overall safety. All verbalized understanding and handouts were given to reinforce information. Patient left upright in the wheelchair with family present and alarm on. Continue with current plan of care.   Pain Pain Assessment Pain Scale: 0-10 Pain Score: 0-No pain  Therapy/Group: Individual Therapy  Denishia Citro 12/02/2019, 10:06 AM

## 2019-12-02 NOTE — Progress Notes (Signed)
Physical Therapy Session Note  Patient Details  Name: Sarah Delacruz MRN: [id number] Date of Birth: 1924/08/03  Today's Date: 12/02/2019 PT Individual Time: 0930(make up)-1010 PT Individual Time Calculation (min): 40 min   Short Term Goals: Week 4:  PT Short Term Goal 1 (Week 4): = to LTGs based on ELOS  Skilled Therapeutic Interventions/Progress Updates: Pt presented in bed with nsg present agreeable to therapy (make up time). Session focused on functional transfers and ambulation. Pt performed supine to sit at EOB with min to modA with increased time and use of bed features. Pt required increased time and minA for scooting to EOB. PTA threaded pants maxA for time management. Performed STS from EOB modA requiring x 2 attempts due to posterior lean and pushing. Once in standing PTA pulled up pants total A for time management. Pt performed stand pivot to transfer to L with CGA and mod verbal cues for sequencing. Performed STS from w/c minA with min cues for sequencing and ambulated x 11f CGA in room to standard chair. Pt with decreased eccentric control upon sitting and required cues for reaching back. Pt then ambulated additional 120fchair to bed with x 2 turns (R then L). Pt transferred sit to supine modA with use of bed features. Pt repositioned to comfort and left with bed alarm on, call bell within reach and needs met.      Therapy Documentation Precautions:  Precautions Precautions: Fall, Other (comment) Precaution Comments: left inattention, extensor patterns in LEs and trunk Restrictions Weight Bearing Restrictions: No General:   Vital Signs: Therapy Vitals Temp: 97.8 F (36.6 C) Temp Source: Oral Pulse Rate: 79 Resp: 16 BP: (!) 104/46 Patient Position (if appropriate): Sitting Oxygen Therapy SpO2: 99 % O2 Device: Room Air Pain: Pain Assessment Pain Scale: 0-10 Faces Pain Scale: No hurt Mobility:   Locomotion :    Trunk/Postural Assessment :    Balance:    Exercises:   Other Treatments:      Therapy/Group: Individual Therapy  Brant Peets 12/02/2019, 4:31 PM

## 2019-12-03 ENCOUNTER — Ambulatory Visit (HOSPITAL_COMMUNITY): Payer: Medicare Other

## 2019-12-03 ENCOUNTER — Inpatient Hospital Stay (HOSPITAL_COMMUNITY): Payer: Medicare Other | Admitting: Speech Pathology

## 2019-12-03 ENCOUNTER — Encounter (HOSPITAL_COMMUNITY): Payer: Medicare Other | Admitting: Speech Pathology

## 2019-12-03 ENCOUNTER — Encounter (HOSPITAL_COMMUNITY): Payer: Medicare Other | Admitting: Occupational Therapy

## 2019-12-03 ENCOUNTER — Inpatient Hospital Stay (HOSPITAL_COMMUNITY): Payer: Medicare Other

## 2019-12-03 LAB — BASIC METABOLIC PANEL
Anion gap: 10 (ref 5–15)
BUN: 22 mg/dL (ref 8–23)
CO2: 27 mmol/L (ref 22–32)
Calcium: 9.2 mg/dL (ref 8.9–10.3)
Chloride: 101 mmol/L (ref 98–111)
Creatinine, Ser: 1.34 mg/dL — ABNORMAL HIGH (ref 0.44–1.00)
GFR calc Af Amer: 39 mL/min — ABNORMAL LOW (ref >60)
GFR calc non Af Amer: 34 mL/min — ABNORMAL LOW (ref >60)
Glucose, Bld: 91 mg/dL (ref 70–99)
Potassium: 4.1 mmol/L (ref 3.5–5.1)
Sodium: 138 mmol/L (ref 135–145)

## 2019-12-03 MED ORDER — SORBITOL 70 % SOLN
30.0000 mL | Freq: Every day | Status: DC | PRN
  Filled 2019-12-03: qty 30

## 2019-12-03 NOTE — Progress Notes (Signed)
Speech Language Pathology Daily Session Notes  Patient Details  Name: Sarah Delacruz MRN: [id number] Date of Birth: Mar 04, 1924  Today's Date: 12/03/2019  Session 1: SLP Individual Time: 5992-3414 SLP Individual Time Calculation (min): 25 min   Session 2: SLP Individual Time: 1400-1420 SLP Individual Time Calculation (min): 20 min  Short Term Goals: Week 4: SLP Short Term Goal 1 (Week 4): STGs=LTGs due to ELOS  Skilled Therapeutic Interventions:  Session 1: Skilled treatment session focused on cognitive goals. SLP facilitated session by providing Min A verbal cues for patient to generate a list of activities she can do safely at home to stay cognitively active. Verbal cues needed for proper modification of activities to maximize safety. Patient left upright in the bed with alarm on and all needs within reach. Continue with current plan of care.   Session 2: Skilled treatment session focused on completion of patient and family education with the patient's daughters and granddaughters. All were educated on activities that the patient can participate in safely at home to stay cognitively active. All verbalized understanding of information. Family asking questions related to social work, deborah notified and addressed questions, therefore, session ended early. Patient left upright in wheelchair with family present. Continue with current plan of care.    Pain No/Denies Pain   Therapy/Group: Individual Therapy  Sarah Delacruz 12/03/2019, 3:24 PM

## 2019-12-03 NOTE — Progress Notes (Signed)
Physical Therapy Session Note  Patient Details  Name: Sarah Delacruz MRN: [id number] Date of Birth: 04-Apr-1924  Today's Date: 12/03/2019 PT Individual Time: 1132-1200 and 1450-1530 PT Individual Time Calculation (min): 28 min and 40 min     Short Term Goals: Week 4:  PT Short Term Goal 1 (Week 4): = to LTGs based on ELOS  Skilled Therapeutic Interventions/Progress Updates:    Session 1: Pt supine in bed upon PT arrival, agreeable to therapy tx and denies pain. Pt transferred to sitting EOB this session with min assist, sitting EOB with initial posterior lean, cues to correct. Seated EOB while therapist dons shoes total assist. Pt reports having to use the bathroom. Sit>stand mod assist with RW and ambulated from bed>bathroom  x 12 ft min assist, increased cues for RW management with turn to sit on toilet. Therapist assisted with clothing management. Pt continent of bladder. Pt performed standing balance with RW and CGA while therapist assisted to pull pants back up. Pt ambulated x 10 ft to the w/c with min assist and RW, cues for upright posture and foot placement. Pt seated in w/c at the sink to wash hands, cues for attention/use of L hand for this task. Pt left in w/c at end of session with needs in reach and chair alarm set.    Session 2: Pt seated on toilet PT arrival, agreeable to therapy tx and denies pain. Pt's family present this session for family education. Provided hands on family education to perform the following activities - provided assist to help pt stand from toilet with RW, assisted pt with pericare and then assisted pt to ambulate x 10 ft to her w/c. Therapist educated family on w/c parts management - how to fold w/c, apply breaks, and don/doff leg rests. Therapist providing education to family regarding techniques for mobility, guarding patient, safety and appropriate cues to provide the patient. Pt was transported to ortho gym this session and performed stand pivot car transfer  with RW and assist from family, therapist providing cues/education. Pt returned to room at end of session and communicated to NT to help pt change into gown/get in bed.   Therapy Documentation Precautions:  Precautions Precautions: Fall, Other (comment) Precaution Comments: left inattention, extensor patterns in LEs and trunk Restrictions Weight Bearing Restrictions: No   Therapy/Group: Individual Therapy  Cresenciano Genre, PT, DPT, CSRS  12/03/2019, 7:55 AM

## 2019-12-03 NOTE — Patient Care Conference (Signed)
Inpatient RehabilitationTeam Conference and Plan of Care Update Date: 12/03/2019   Time: 10:35 AM    Patient Name: Sarah Delacruz      Medical Record Number: [id number]  Date of Birth: 12-08-1923 Sex: Female         Room/Bed: 4W16C/4W16C-02 Payor Info: Payor: MEDICARE / Plan: MEDICARE PART A AND B / Product Type: *No Product type* /    Admit Date/Time:  11/05/2019  1:45 PM  Primary Diagnosis:  Acute right MCA stroke [facility])  Patient Active Problem List   Diagnosis Date Noted  . Labile blood pressure   . Acute right MCA stroke ([place]) 11/05/2019  . Acute cerebral infarction ([place])   . Stage 3 chronic kidney disease   . Chronic constipation   . Chronic diastolic congestive heart failure ([place])   . Essential hypertension   . Dysphagia, post-stroke     Expected Discharge Date: Expected Discharge Date: 12/05/19  Team Members Present: Physician leading conference: Dr. Alysia Penna Care Coodinator Present: Nestor Lewandowsky, RN, BSN, CRRN;Genie Jamarkus [place], RN, MSN Nurse Present: Suella Grove, RN PT Present: Drema Dallas Shagen, PT;Carr Zenia Resides, PT OT Present: Clyda Greener, OT SLP Present: Jettie Booze, CF-SLP PPS Coordinator present : Ileana Ladd, PT     Current Status/Progress Goal Weekly Team Focus  Bowel/Bladder   Pt is continent of B/B, LBM 11/30/19  remain continent of bowel/bladder  Timed toileting   Swallow/Nutrition/ Hydration   Dys. 2 textures with thin liquids, Min A  Min A  Family education   ADL's   Min A for UB bathing, Mod A for UB dressing with hand over hand assist and increased time for use of LUE (although improving). Mod A to Max A for LB bathing/dressing in sitting/standing. Once standing with use of RW, patient requires Min guard to maintain static standing balance with hygiene and clothing management. Mod A for toilet transfers to [facility]  min to mod assist overall  Family education, self-care re-education, NMR, static/dynamic sitting/standing balance, therapuetic activities,  therapeutic exercise.   Mobility   min assist bed mobility, min assist sit<>stand and stand pivot transfers using RW with intermittent mod assist due to posterior lean, gait training up to 87ft using RW with min assist  min-modA  anterior trunk lean facilitation during sit<>stand transfers using RW, blocked practice functional transfers for increased independence for sequencing and decreased assistance, standing balance, activity tolerance, gait training, pt education, hands-on family training   Communication   Supervision-Min A  Min A  Family education   Safety/Cognition/ Behavioral Observations  Min-Mod A  Min A  Family education   Pain   No c/o pain  remain pain free  Assess pain shift/PRN   Skin   healing MASD to buttocks; barrier cream applied. No obvious signs of sikin breakdown or infection  prevent further breakdown/infection  Assess skin Qshift/PRN    Rehab Goals Patient on target to meet rehab goals: Yes *See Care Plan and progress notes for long and short-term goals.     Barriers to Discharge  Current Status/Progress Possible Resolutions Date Resolved   Nursing                  PT                    OT                  SLP                SW Decreased  caregiver support;Incontinence Home with daughter(s) and grand-daughter(s) to assist along with hired caregivers Family arranging home private duty assistance to supplement family care          Discharge Planning/Teaching Needs:  Home with daughter  Transfers, Toileting, Bathing, Medications, etc   Team Discussion: MD more strength LUE, tone improved, monitoring BP, meds adjusted, maintaining hydration, po intake improving.  RN cont B/B, cream to bottom.  OT min UB B, mod UB D, mod/max LB B/D, min sit to stand, toilet transfers min A, mod A clothing management, goals min/mod.  PT fam ed yesterday with Dtr, min/CGA bed, min transfers, amb 30-50' min A.  SLP D2thins min A, min A cognition goals, fam ed yesterday.  Has  sister, Dtr, GDtr ato assist at DC.   Revisions to Treatment Plan: N/A     Medical Summary Current Status: Blood pressures under better control, maintaining patient without IV fluids, creatinine is in a stable range Weekly Focus/Goal: Discharge planning, antihypertensive medication adjustments  Barriers to Discharge: Medical stability   Possible Resolutions to Barriers: Have reduced hydralazine dose.  May need to make further adjustments prior to discharge, family training   Continued Need for Acute Rehabilitation Level of Care: The patient requires daily medical management by a physician with specialized training in physical medicine and rehabilitation for the following reasons: Direction of a multidisciplinary physical rehabilitation program to maximize functional independence : Yes Medical management of patient stability for increased activity during participation in an intensive rehabilitation regime.: Yes Analysis of laboratory values and/or radiology reports with any subsequent need for medication adjustment and/or medical intervention. : Yes   I attest that I was present, lead the team conference, and concur with the assessment and plan of the team.   Trish Mage 12/03/2019, 4:24 PM   Team conference was held via web/ teleconference due to COVID - 19

## 2019-12-03 NOTE — Progress Notes (Signed)
Occupational Therapy Session Note  Patient Details  Name: Sarah Delacruz MRN: [id number] Date of Birth: [birth year]  Today's Date: 12/03/2019 OT Individual Time: 7544-9201 OT Individual Time Calculation (min): 43 min    Short Term Goals: Week 4:  OT Short Term Goal 1 (Week 4): Pt will complete UB bathing in supported sitting with min assist. OT Short Term Goal 2 (Week 4): Patient will complete transfers to [facility] with Mod A and Mod cues.  Skilled Therapeutic Interventions/Progress Updates:  Patient met seated in wc in agreement with OT treatment session. Family present for education session from [phone]. Patient indicated need for BM. Sit to stand from wc with Min A and functional mobility to commode in bathroom with CGA-Min A for walker management. Toilet transfer with Min A for controlled descent. Patient completed toileting with Mod A for hygiene/clothing management. Patient's family entered as patient was exiting bathroom. Family education on toilet transfers and toileting/hygiene/clothing management including cueing, assistance for walker management, and patient's limitations and CLOF. Patient's daughter and granddaughter able to return demonstrate toilet transfers with good safety awareness and hand on RW for management. OT provided education on UB/LB bathing/dressing including limitations of LUE secondary to extensor tone, decreased coordination, decreased motor planning and decreased AROM with family expressing verbal understanding. Patients daughter able to assist patient with doffing/donning UB clothing seated in wc. OT provided education on standing on patient's L, appropriate cueing, and assistance to dress L side as needed. Patient expresses verbal understanding of recommendation for 24 hour supervision/assistnace upon d/c. All patient/family questions answered. Patient left seated in wc with call bell within reach, belt alarm activated and RN/patient's family present.   Therapy  Documentation Precautions:  Precautions Precautions: Fall, Other (comment) Precaution Comments: left inattention, extensor patterns in LEs and trunk Restrictions Weight Bearing Restrictions: No  Therapy/Group: Individual Therapy  [name] 12/03/2019, 10:58 AM

## 2019-12-03 NOTE — Progress Notes (Signed)
[facility] PHYSICAL MEDICINE & REHABILITATION PROGRESS NOTE   Subjective/Complaints:  No issues overnite, pt denies pain , brathing issues or bowel /bladder issues  ROS: Patient denies CP,SOB. N/V/D Objective: No results found. Recent Labs    12/01/19 0545  WBC 6.8  HGB 9.8*  HCT 31.1*  PLT 276   Recent Labs    12/03/19 0002  NA 138  K 4.1  CL 101  CO2 27  GLUCOSE 91  BUN 22  CREATININE 1.34*  CALCIUM 9.2    Intake/Output Summary (Last 24 hours) at 12/03/2019 0914 Last data filed at 12/02/2019 1900 Gross per 24 hour  Intake 480 ml  Output --  Net 480 ml     Physical Exam: Vital Signs Blood pressure (!) 110/43, pulse 73, temperature 97.8 F (36.6 C), temperature source Oral, resp. rate 16, height 5' 6"  (1.676 m), weight 48.8 kg, SpO2 96 %.     General: No acute distress Mood and affect are appropriate Heart: Regular rate and rhythm no rubs murmurs or extra sounds Lungs: Clear to auscultation, breathing unlabored, no rales or wheezes Abdomen: Positive bowel sounds, soft nontender to palpation, nondistended Extremities: No clubbing, cyanosis, or edema Skin: No evidence of breakdown, no evidence of rash  Musculoskeletal: Full range of motion in all 4 extremities. No joint swelling     LUE: 3- to 3/5 delt , biceps triceps , grasp and release LLE: 4-/5 HF, KE, ADF   Assessment/Plan: 1. Functional deficits secondary to Right MCA infarct  which require 3+ hours per day of interdisciplinary therapy in a comprehensive inpatient rehab setting.  Physiatrist is providing close team supervision and 24 hour management of active medical problems listed below.  Physiatrist and rehab team continue to assess barriers to discharge/monitor patient progress toward functional and medical goals  Care Tool:  Bathing    Body parts bathed by patient: Chest, Abdomen, Front perineal area, Buttocks, Right upper leg, Left upper leg, Left lower leg, Face, Right lower leg, Left  arm   Body parts bathed by helper: Buttocks, Right arm Body parts n/a: Right arm, Left arm, Chest, Abdomen, Right upper leg, Left upper leg, Right lower leg, Left lower leg, Face   Bathing assist Assist Level: Moderate Assistance - Patient 50 - 74%     Upper Body Dressing/Undressing Upper body dressing   What is the patient wearing?: Pull over shirt    Upper body assist Assist Level: Moderate Assistance - Patient 50 - 74%    Lower Body Dressing/Undressing Lower body dressing      What is the patient wearing?: Incontinence brief, Pants     Lower body assist Assist for lower body dressing: Maximal Assistance - Patient 25 - 49%     Toileting Toileting    Toileting assist Assist for toileting: Minimal Assistance - Patient > 75%     Transfers Chair/bed transfer  Transfers assist     Chair/bed transfer assist level: Minimal Assistance - Patient > 75% Chair/bed transfer assistive device: Walker, Armrests   Locomotion Ambulation   Ambulation assist   Ambulation activity did not occur: Safety/medical concerns(poor balance/high fall risk)  Assist level: Minimal Assistance - Patient > 75% Assistive device: Walker-rolling Max distance: 46f   Walk 10 feet activity   Assist  Walk 10 feet activity did not occur: Safety/medical concerns(poor balance/high fall risk)  Assist level: Minimal Assistance - Patient > 75% Assistive device: Walker-rolling   Walk 50 feet activity   Assist Walk 50 feet with 2 turns activity  did not occur: Safety/medical concerns(poor balance/high fall risk)  Assist level: Minimal Assistance - Patient > 75% Assistive device: Walker-rolling    Walk 150 feet activity   Assist Walk 150 feet activity did not occur: Safety/medical concerns(poor balance/high fall risk)  Assist level: Moderate Assistance - Patient - 50 - 74% Assistive device: Walker-rolling    Walk 10 feet on uneven surface  activity   Assist Walk 10 feet on uneven  surfaces activity did not occur: Safety/medical concerns(poor balance/high fall risk)         Wheelchair     Assist Will patient use wheelchair at discharge?: Yes Type of Wheelchair: Manual    Wheelchair assist level: Dependent - Patient 0% Max wheelchair distance: 150 ft    Wheelchair 50 feet with 2 turns activity    Assist        Assist Level: Dependent - Patient 0%   Wheelchair 150 feet activity     Assist      Assist Level: Dependent - Patient 0%   Blood pressure (!) 110/43, pulse 73, temperature 97.8 F (36.6 C), temperature source Oral, resp. rate 16, height 5' 6"  (1.676 m), weight 48.8 kg, SpO2 96 %.   Medical Problem List and Plan: 1.  Left hemiparesis with sensory deficits, L-HH with right inattention, pusher tendencies to the left/back, severe dysarthria secondary to right MCA infarct.  Continue CIR PT, OT, SLP  Planned d/c 4/9 Team conference today please see physician documentation under team conference tab, met with team  to discuss problems,progress, and goals. Formulized individual treatment plan based on medical history, underlying problem and comorbidities. 2.  Antithrombotics: -DVT/anticoagulation:  Pharmaceutical: Lovenox             -antiplatelet therapy: ASA daily.  3. Pain Management: Tylenol prn.  4. Mood: LCSW to follow for evaluation and support.              -antipsychotic agents: N/A 5. Neuropsych: This patient is? capable of making decisions on her own behalf. 6. Skin/Wound Care: Routine pressure relief measure.  7. Fluids/Electrolytes/Nutrition: Monitor I/Os.   8. HTN:   Vitals:   12/03/19 0618 12/03/19 0813  BP: 129/77 (!) 110/43  Pulse: 71 73  Resp:    Temp:    SpO2:     Controlled 4/7, lopressor  25 mg BID HR in 60s  bp well controlled in fact on low side reduce Hydralazine to 25 TID on 4/6, if sys BPremains<115 may consider change hydralazine to 56m TID 9. Chronic diastolic CHF/Valvular disease: Monitor weights  daily with I/Os. Continue hydralazine, metoprolol, pravastatin and ASA. No ARB/ACE due to CKD.              Daily weights Filed Weights   11/30/19 0333 12/01/19 0432 12/03/19 0336  Weight: 50.6 kg 47.6 kg 48.8 kg  4/5 down, ? Technique  10. Dyslipidemia: On pravastatin.  11. GERD: resume PPI.  12. Chronic constipation: Continue Senna.  Adjust bowel meds as necessary. 13. CKD III: Monitor renal status with serial checks.    Creat in range of 1.0-1.4 BUN normal 14. GRJ:JOACZYcontinent Voiding 15.  Post stroke dysphagia- given               D2 thins, advance diet as tolerated 16.  Bowel incont lkely multifactorial with reduced awareness, delay in notifying nurse and difficulty getting g on bedpan   Improving 17. Anemia:   Hemoglobin 9.4 on 3/24,9.7 on 3/29, 9.8 on 4/5  Fecal OB neg ,  may be dilutional due to IVF 18.  Spasticity-improving  avoid tizanidine due to hypotension, avoid baclofen due to CKD, may need BOtox as OP, Left triceps- improving , moving out of extensor syndergy pattern in elbow, also able to grasp and release   LOS: 28 days A FACE TO [place] E Breeanne Oblinger 12/03/2019, 9:14 AM

## 2019-12-04 ENCOUNTER — Inpatient Hospital Stay (HOSPITAL_COMMUNITY): Payer: Medicare Other | Admitting: Speech Pathology

## 2019-12-04 ENCOUNTER — Inpatient Hospital Stay (HOSPITAL_COMMUNITY): Payer: Medicare Other | Admitting: Physical Therapy

## 2019-12-04 ENCOUNTER — Inpatient Hospital Stay (HOSPITAL_COMMUNITY): Payer: Medicare Other | Admitting: Occupational Therapy

## 2019-12-04 MED ORDER — GENERIC EXTERNAL MEDICATION
Status: DC
Start: ? — End: 2019-12-04

## 2019-12-04 MED ORDER — HYDRALAZINE HCL 10 MG PO TABS
10.0000 mg | ORAL_TABLET | Freq: Three times a day (TID) | ORAL | Status: DC
  Administered 2019-12-04 – 2019-12-05 (×3): 10 mg via ORAL
  Filled 2019-12-04 (×3): qty 1

## 2019-12-04 NOTE — Progress Notes (Signed)
Speech Language Pathology Discharge Summary  Patient Details  Name: Sarah Delacruz MRN: [id number] Date of Birth: 01-30-1924  Today's Date: 12/04/2019 SLP Individual Time: 0830-0900 SLP Individual Time Calculation (min): 30 min   Skilled Therapeutic Interventions:  Skilled treatment session focused on speech goals. SLP facilitated session by providing overall Min A verbal cues for use of speech intelligibility strategies and shorter, concise sentences to maximize intelligibility to ~90% during a structured verbal description task. Intelligibility was reduced during an informal conversation about discharge, suspect due to decreased context of conversation. Patient left upright in bed with alarm on and all needs within reach.   Patient has met 9 of 9 long term goals.  Patient to discharge at overall Supervision;Min level.   Reasons goals not met: N/A   Clinical Impression/Discharge Summary: Patient has made excellent gains and has met 9 of 9 LTGs this admission. Currently, patient is consuming Dys. 2 textures with thin liquids with minimal overt s/s of aspiration and overall Min A verbal cues for use of swallowing compensatory strategies. Patient also demonstrates improved speech intelligibility and requires supervision-Min A verbal cues for use of speech intelligibility strategies at the phrase and sentence level to achieve ~75-90% intelligibility (depending on fatigue). Patient's overall cognitive functioning has also improved and patient requires Min verbal cues to complete functional and familiar tasks safely in regards to problem solving, attention, recall and awareness. Patient and family education is complete and patient will discharge home with 24 hour supervision from family. Patient would benefit from f/u SLP services to maximize her cognitive, swallowing, and speech function in order to reduce caregiver burden.    Care Partner:  Caregiver Able to Provide Assistance: Yes  Type of Caregiver  Assistance: Physical;Cognitive  Recommendation:  Home Health SLP;24 hour supervision/assistance  Rationale for SLP Follow Up: Maximize functional communication;Maximize cognitive function and independence;Maximize swallowing safety;Reduce caregiver burden   Equipment: N/A   Reasons for discharge: Treatment goals met;Discharged from hospital   Patient/Family Agrees with Progress Made and Goals Achieved: Yes    [place], [place] 12/04/2019, 6:21 AM

## 2019-12-04 NOTE — Care Management (Signed)
   The overall goal for the admission was met for:   Discharge location: Patient to d/c home with family who is able to provide 24 hour supervision/assistance   Length of Stay: 29 days with discharge 12/05/19  Discharge activity level: Patient to discharge at overall Min-Mod Assist level with use of RW  Home/community participation: Limited Participation  Services provided included: MD, RD, PT, OT, SLP, RN, CM, TR, Pharmacy, Neuropsych and SW  Financial Services: Medicare  Follow-up services arranged: Home Health: PT, OT, SLP, DME: RW, DA BSC, w/c w 1/2 left lap tray and Patient/Family request agency HH: [facility], DME: None  Comments (or additional information): [facility] [phone]  Also ordered incontinence supplies from [employer] to d]bedelivered to the home (briefs and chuxs).  Patient/Family verbalized understanding of follow-up arrangements: Yes   Family education 12/02/19 and 12/03/19  Individual responsible for coordination of the follow-up plan: Grand-daughter; [place]: [phone]  Confirmed correct DME delivered: Margarito Liner 12/04/2019    Margarito Liner

## 2019-12-04 NOTE — Progress Notes (Signed)
[facility] PHYSICAL MEDICINE & REHABILITATION PROGRESS NOTE   Subjective/Complaints: No issues overnight. Plan for DC tomorrow. Working on [employer] and sequencing with SLP.   ROS: Patient denies CP,SOB. N/V/D Objective: No results found. No results for input(s): WBC, HGB, HCT, PLT in the last 72 hours. Recent Labs    12/03/19 0002  NA 138  K 4.1  CL 101  CO2 27  GLUCOSE 91  BUN 22  CREATININE 1.34*  CALCIUM 9.2    Intake/Output Summary (Last 24 hours) at 12/04/2019 1042 Last data filed at 12/04/2019 0816 Gross per 24 hour  Intake 240 ml  Output --  Net 240 ml     Physical Exam: Vital Signs Blood pressure (!) 120/58, pulse 66, temperature 98.4 F (36.9 C), resp. rate 16, height 5\' 6"  (1.676 m), weight 49 kg, SpO2 97 %.  General: No acute distress Mood and affect are appropriate Heart: Regular rate and rhythm no rubs murmurs or extra sounds Lungs: Clear to auscultation, breathing unlabored, no rales or wheezes Abdomen: Positive bowel sounds, soft nontender to palpation, nondistended Extremities: No clubbing, cyanosis, or edema Skin: No evidence of breakdown, no evidence of rash Musculoskeletal: Full range of motion in all 4 extremities. No joint swelling LUE: 3- to 3/5 delt , biceps triceps , grasp and release LLE: 4-/5 HF, KE, ADF  Neuro: Successfully described the SLP task she was working on.  Assessment/Plan: 1. Functional deficits secondary to Right MCA infarct  which require 3+ hours per day of interdisciplinary therapy in a comprehensive inpatient rehab setting.  Physiatrist is providing close team supervision and 24 hour management of active medical problems listed below.  Physiatrist and rehab team continue to assess barriers to discharge/monitor patient progress toward functional and medical goals  Care Tool:  Bathing    Body parts bathed by patient: Chest, Abdomen, Front perineal area, Buttocks, Right upper leg, Left upper leg, Left lower  leg, Face, Right lower leg, Left arm   Body parts bathed by helper: Buttocks, Right arm Body parts n/a: Right arm, Left arm, Chest, Abdomen, Right upper leg, Left upper leg, Right lower leg, Left lower leg, Face   Bathing assist Assist Level: Moderate Assistance - Patient 50 - 74%     Upper Body Dressing/Undressing Upper body dressing   What is the patient wearing?: Pull over shirt    Upper body assist Assist Level: Minimal Assistance - Patient > 75%    Lower Body Dressing/Undressing Lower body dressing      What is the patient wearing?: Incontinence brief, Pants     Lower body assist Assist for lower body dressing: Maximal Assistance - Patient 25 - 49%     Toileting Toileting    Toileting assist Assist for toileting: Minimal Assistance - Patient > 75%     Transfers Chair/bed transfer  Transfers assist     Chair/bed transfer assist level: Minimal Assistance - Patient > 75% Chair/bed transfer assistive device: Walker, Clinical biochemist   Ambulation assist   Ambulation activity did not occur: Safety/medical concerns(poor balance/high fall risk)  Assist level: Minimal Assistance - Patient > 75% Assistive device: Walker-rolling Max distance: 12 ft   Walk 10 feet activity   Assist  Walk 10 feet activity did not occur: Safety/medical concerns(poor balance/high fall risk)  Assist level: Minimal Assistance - Patient > 75% Assistive device: Walker-rolling   Walk 50 feet activity   Assist Walk 50 feet with 2 turns activity did not occur: Safety/medical concerns(poor balance/high fall  risk)  Assist level: Minimal Assistance - Patient > 75% Assistive device: Walker-rolling    Walk 150 feet activity   Assist Walk 150 feet activity did not occur: Safety/medical concerns(poor balance/high fall risk)  Assist level: Moderate Assistance - Patient - 50 - 74% Assistive device: Walker-rolling    Walk 10 feet on uneven surface   activity   Assist Walk 10 feet on uneven surfaces activity did not occur: Safety/medical concerns(poor balance/high fall risk)         Wheelchair     Assist Will patient use wheelchair at discharge?: Yes Type of Wheelchair: Manual    Wheelchair assist level: Dependent - Patient 0% Max wheelchair distance: 150 ft    Wheelchair 50 feet with 2 turns activity    Assist        Assist Level: Dependent - Patient 0%   Wheelchair 150 feet activity     Assist      Assist Level: Dependent - Patient 0%   Blood pressure (!) 120/58, pulse 66, temperature 98.4 F (36.9 C), resp. rate 16, height 5\' 6"  (1.676 m), weight 49 kg, SpO2 97 %.   Medical Problem List and Plan: 1.  Left hemiparesis with sensory deficits, L-HH with right inattention, pusher tendencies to the left/back, severe dysarthria secondary to right MCA infarct.  Continue CIR PT, OT, SLP  Planned d/c 4/9 2.  Antithrombotics: -DVT/anticoagulation:  Pharmaceutical: Lovenox             -antiplatelet therapy: ASA daily.  3. Pain Management: Tylenol prn.  4. Mood: LCSW to follow for evaluation and support.              -antipsychotic agents: N/A 5. Neuropsych: This patient is? capable of making decisions on her own behalf. 6. Skin/Wound Care: Routine pressure relief measure.  7. Fluids/Electrolytes/Nutrition: Monitor I/Os.   8. HTN:   Vitals:   12/04/19 0508 12/04/19 0605  BP: (!) 116/46 (!) 120/58  Pulse: 66   Resp: 16   Temp: 98.4 F (36.9 C)   SpO2: 97%    Controlled 4/8, lopressor  25 mg BID HR in 60s. On low side. Will decrease Hydralazine to 10mg  TID 9. Chronic diastolic CHF/Valvular disease: Monitor weights daily with I/Os. Continue hydralazine, metoprolol, pravastatin and ASA. No ARB/ACE due to CKD.              Daily weights Filed Weights   12/01/19 0432 12/03/19 0336 12/04/19 0508  Weight: 47.6 kg 48.8 kg 49 kg  4/5 down, ? Technique  10. Dyslipidemia: On pravastatin.  11. GERD: resume  PPI.  12. Chronic constipation: Continue Senna.  Adjust bowel meds as necessary. 13. CKD III: Monitor renal status with serial checks.    Creat in range of 1.0-1.4 BUN normal 14. 02/02/20 continent Voiding 15.  Post stroke dysphagia- given               D2 thins, advance diet as tolerated 16.  Bowel incont lkely multifactorial with reduced awareness, delay in notifying nurse and difficulty getting g on bedpan   Improving 17. Anemia:   Hemoglobin 9.4 on 3/24,9.7 on 3/29, 9.8 on 4/5  Fecal OB neg , may be dilutional due to IVF 18.  Spasticity-improving avoid tizanidine due to hypotension, avoid baclofen due to CKD, may need BOtox as OP, Left triceps- improving , moving out of extensor syndergy pattern in elbow, also able to grasp and release   LOS: 29 days A FACE TO FACE EVALUATION WAS PERFORMED  Clint Bolder P Brecken Walth 12/04/2019, 10:42 AM

## 2019-12-04 NOTE — Progress Notes (Signed)
Physical Therapy Discharge Summary  Patient Details  Name: Sarah Delacruz MRN: [id number] Date of Birth: 18-Jul-1924  Today's Date: 12/04/2019 PT Individual Time: 9485-4627 PT Individual Time Calculation (min): 41 min    Patient has met 9 of 9 long term goals due to improved activity tolerance, improved balance, improved postural control, increased strength, ability to compensate for deficits, functional use of  left upper extremity and left lower extremity, improved attention, improved awareness and improved coordination.  Patient to discharge at an ambulatory level Min Assist using RW with occasional mod assist due to fatigue.   Patient's care partners attended hands-on training and are independent to provide the necessary physical and cognitive assistance at discharge.  All goals met.  Recommendation:  Patient will benefit from ongoing skilled PT services in home health setting to continue to advance safe functional mobility, address ongoing impairments in activity tolerance, B LE strengthening, L hemiparesis, L inattention, motor planning, and minimize fall risk.  Equipment: wheelchair and RW  Reasons for discharge: treatment goals met and discharge from hospital  Patient/family agrees with progress made and goals achieved: Yes  Skilled Therapeutic Interventions/Progress Updates:  Pt received sitting in w/c and agreeable to therapy session. Pt reports her family was not able to return for additional hands-on training today but that they do not have any concerns. Sit>stand w/c>RW with min assist for balance due to slight posterior lean - pt demonstrating carryover and recall of proper sequencing of sit<>stand transfers including L UE hand placement on/off RW orthotic. Gait training ~38f in room to pt's new w/c with CGA for steadying and min assist for balance while turning to sit - max cuing for sequencing of LE stepping and AD management to turn fully prior to sitting. Therapist adjusted  pt's w/c leg rests for improved sitting posture in chair for weight distribution and pressure relief.  Transported to/from gym in w/c for time management and energy conservation. Simulated stand pivot car transfer (sedan height) to/from w/c using RW with min assist for balance getting into the car and for placing B LEs into the vehicle but mod assist for getting out of the car due to low seat height. Transported back to room in w/c and pt requesting to use bathroom - left in care of NT.   PT Discharge Precautions/Restrictions Precautions Precautions: Fall;Other (comment) Precaution Comments: left inattention, extensor patterns in LEs and trunk although improved since initial eval. Restrictions Weight Bearing Restrictions: No Pain Pain Assessment Pain Scale: 0-10 Pain Score: 0-No pain Perception  Perception Perception: Impaired Inattention/Neglect: Does not attend to left visual field Praxis Praxis: Impaired Praxis Impairment Details: Motor planning;Perseveration;Initiation  Cognition Overall Cognitive Status: Impaired/Different from baseline Arousal/Alertness: Awake/alert Orientation Level: Oriented to person;Oriented to place;Oriented to situation;Disoriented to time Attention: Focused;Sustained Focused Attention: Appears intact Memory: Impaired Safety/Judgment: Impaired Sensation Sensation Light Touch: Impaired Detail Central sensation comments: unable to sense light touch in L LE on screen Light Touch Impaired Details: Impaired LLE Hot/Cold: Not tested Proprioception: Impaired by gross assessment Stereognosis: Not tested Coordination Gross Motor Movements are Fluid and Coordinated: No Coordination and Movement Description: gross motor movements due to impaired L LE strength and motor control/coordination as well as L LE extensor tone Motor  Motor Motor: Hemiplegia;Abnormal tone;Abnormal postural alignment and control Motor - Discharge Observations: Increased extensor tone  in LUE, L LE, and in trunk; L hemiparesis though improved  Mobility Bed Mobility Bed Mobility: Sit to Supine;Supine to Sit Rolling Right: Minimal Assistance - Patient > 75% Rolling Left: Minimal  Assistance - Patient > 75% Supine to Sit: Minimal Assistance - Patient > 75% Sit to Supine: Minimal Assistance - Patient > 75% Transfers Transfers: Sit to Stand;Stand to Sit;Stand Pivot Transfers;Squat Pivot Transfers Sit to Stand: Minimal Assistance - Patient > 75% Stand to Sit: Minimal Assistance - Patient > 75% Stand Pivot Transfers: Minimal Assistance - Patient > 75% Stand Pivot Transfer Details: Tactile cues for weight shifting;Tactile cues for placement;Tactile cues for posture;Tactile cues for sequencing;Tactile cues for initiation;Verbal cues for gait pattern;Verbal cues for safe use of DME/AE;Verbal cues for technique;Verbal cues for sequencing Stand Pivot Transfer Details (indicate cue type and reason): Multimodal cues for hand placement, foot placement, and walker management. Transfer (Assistive device): Manufacturing systems engineer Ambulation: Yes Gait Assistance: Minimal Assistance - Patient > 75% Gait Distance (Feet): 60 Feet(up to 31f recently ambulating ~377fon average) Assistive device: Rolling walker Gait Assistance Details: Tactile cues for posture;Tactile cues for sequencing;Verbal cues for gait pattern;Verbal cues for technique;Verbal cues for precautions/safety;Verbal cues for safe use of DME/AE;Visual cues/gestures for sequencing;Verbal cues for sequencing Gait Gait: Yes Gait Pattern: Decreased step length - left;Decreased step length - right;Step-to pattern;Decreased stride length;Narrow base of support;Abducted - left Gait velocity: significantly decreased Stairs / Additional Locomotion Stairs: No(ramped entrance) Ramp: Moderate Assistance - Patient 50 - 74% Curb: Moderate Assistance - Patient 50 - 74% Wheelchair Mobility Wheelchair Mobility: No(not functional at  w/c propulsion - planning for dependent transport)  Trunk/Postural Assessment  Cervical Assessment Cervical Assessment: Exceptions to WFL(forward head) Thoracic Assessment Thoracic Assessment: Exceptions to WFL(thoracic kyphosis) Lumbar Assessment Lumbar Assessment: Exceptions to WFL(posterior pelvic tilt in sitting) Postural Control Postural Control: Deficits on evaluation Trunk Control: tends to have L posterolateral trunk lean Righting Reactions: delayed, inadquate, and with poor motor planning  Balance Balance Balance Assessed: Yes Static Sitting Balance Static Sitting - Balance Support: Feet supported Static Sitting - Level of Assistance: 5: Stand by assistance Dynamic Sitting Balance Dynamic Sitting - Balance Support: Feet supported;During functional activity Dynamic Sitting - Level of Assistance: 4: Min assist Static Standing Balance Static Standing - Balance Support: During functional activity;Bilateral upper extremity supported Static Standing - Level of Assistance: 4: Min assist(CGA/min assist) Dynamic Standing Balance Dynamic Standing - Balance Support: During functional activity;Bilateral upper extremity supported Dynamic Standing - Level of Assistance: 4: Min assist;3: Mod assist(occasional mod assist due to fatigue) Extremity Assessment      RLE Assessment RLE Assessment: Exceptions to [facility] of Motion (AROM) Comments: WFL General Strength Comments: assessed in sitting with pt continuing to have some difficulty following commands for testing RLE Strength Right Hip Flexion: 3-/5 Right Knee Flexion: 3+/5 Right Knee Extension: 4-/5 Right Ankle Dorsiflexion: 3/5 Right Ankle Plantar Flexion: 3+/5 LLE Assessment LLE Assessment: Exceptions to [facility] (AROM) Comments: WFL but with ankle DF just to neutral LLE Strength Left Hip Flexion: 2+/5 Left Knee Flexion: 3-/5 Left Knee Extension: 4-/5(increased tone) Left Ankle Dorsiflexion:  2+/5 Left Ankle Plantar Flexion: 3-/5    CaTawana ScalePT, DPT  12/04/2019, 1:17 PM

## 2019-12-04 NOTE — Progress Notes (Signed)
Occupational Therapy Discharge Summary  Patient Details  Name: Sarah Delacruz MRN: [id number] Date of Birth: Nov 25, 1923  Today's Date: 12/04/2019 OT Individual Time: 1050-1200 OT Individual Time Calculation (min): 70 min    Patient has met 10 of 10 long term goals due to improved activity tolerance, improved balance, postural control, ability to compensate for deficits, functional use of  LEFT upper and LEFT lower extremity, improved attention, improved awareness and improved coordination.  Patient to discharge at overall Min-Mod Assist level with use of RW. Patient's CLOF is Min A-Mod A for functional transfers to and from commode with use of RW and cueing/assistance for walker management. Bathing in supported sitting with Min-Mod A and hand over hand assist for incorporation of LUE. Patient able to don UB clothing with Min-Mod A and LB clothing with Mod A secondary to increased extensor tone in trunk and BLE. Patient completes toilet transfers and toileting with Min-Mod A for hygiene/clothing management in standing. Patient with improved static/dyanmic standing balance with need for Min guard to maintain during fnctional tasks. Patient to d/c home with family who is able to provide 24 hour supervision/assistance with self-care tasks, IADLs, and transportation.   Reasons goals not met: N/A  Recommendation:  Patient will benefit from ongoing skilled OT services in home health setting to continue to advance functional skills in the area of BADL and Reduce care partner burden with focus on L attention, NMR, functional mobility, tranfers, and self-care re-education.   Equipment: DABSC  Reasons for discharge: treatment goals met and discharge from hospital  Patient/family agrees with progress made and goals achieved: Yes   Skilled Intervention  Patient met lying supine in bed in agreement with OT treatment session. Skilled OT services provided with focus on self-care re-education and therapeutic  activity as detailed below.   OT Discharge Precautions/Restrictions  Precautions Precautions: Fall;Other (comment) Precaution Comments: left inattention, extensor patterns in LEs and trunk although improved since initial eval. Restrictions Weight Bearing Restrictions: No Vital Signs Therapy Vitals Temp: 97.8 F (36.6 C) Pulse Rate: 66 Resp: 18 BP: (!) 120/51 Patient Position (if appropriate): Sitting Oxygen Therapy SpO2: 100 % O2 Device: Room Air Pain Pain Assessment Pain Scale: 0-10 Pain Score: 0-No pain ADL ADL Eating: Supervision/safety Where Assessed-Eating: Chair Grooming: Moderate assistance Where Assessed-Grooming: Sitting at sink Upper Body Bathing: Minimal assistance Where Assessed-Upper Body Bathing: Chair Lower Body Bathing: Moderate assistance Where Assessed-Lower Body Bathing: Chair Upper Body Dressing: Minimal assistance Where Assessed-Upper Body Dressing: Chair Lower Body Dressing: Moderate assistance Where Assessed-Lower Body Dressing: Chair Toileting: Minimal assistance Where Assessed-Toileting: Glass blower/designer: Psychiatric nurse Method: Arts development officer: Radiographer, therapeutic: Not assessed(Patient sponge bathed at sink level at baseline.) ADL Comments: Patient with increased static/dynamic sitting/standing balance, and L attention requiring Mod cues for sequencing secondary to motor planning deficits although improved since initial eval. Patient with fluctuations in level of assistance needed for completion of ADLs and functional transfers depending on time of day (stiff in a.m.). Vision Baseline Vision/History: No visual deficits Patient Visual Report: No change from baseline(L visual field cut in RUQ and RLQ. Patient able to correctly identify number of therapists fingers presented in central vision.) Perception  Perception: Impaired Inattention/Neglect: Does not attend to left visual  field Praxis Praxis: Impaired Praxis Impairment Details: Motor planning;Perseveration;Initiation Cognition Overall Cognitive Status: Impaired/Different from baseline Arousal/Alertness: Awake/alert Orientation Level: Oriented to person;Oriented to place;Oriented to situation;Disoriented to time Attention: Focused;Sustained Focused Attention: Appears intact Memory: Impaired Safety/Judgment: Impaired Comments: Patient  requires cueing for safety awareness.  Sensation Sensation Light Touch: Appears Intact Central sensation comments: unable to sense light touch in L LE on screen Light Touch Impaired Details: Impaired LLE Hot/Cold: Appears Intact Proprioception: Impaired by gross assessment Stereognosis: Not tested Coordination Gross Motor Movements are Fluid and Coordinated: No Fine Motor Movements are Fluid and Coordinated: No Coordination and Movement Description: Gross motor movements 2/2 impaired RUE coordination, strength, and extensor tone. Finger Nose Finger Test: L UE hemiplegia with increased tone Motor  Motor Motor: Hemiplegia;Abnormal tone;Abnormal postural alignment and control Motor - Discharge Observations: Increased extensor tone in LUE and extensor tone in trunk Mobility  Bed Mobility Bed Mobility: Rolling Right;Rolling Left;Supine to Sit;Sit to Supine Rolling Right: Minimal Assistance - Patient > 75% Rolling Left: Minimal Assistance - Patient > 75% Supine to Sit: Minimal Assistance - Patient > 75% Sit to Supine: Minimal Assistance - Patient > 75% Transfers Sit to Stand: Minimal Assistance - Patient > 75% Stand to Sit: Minimal Assistance - Patient > 75%  Trunk/Postural Assessment  Cervical Assessment Cervical Assessment: Exceptions to WFL(Forward head) Thoracic Assessment Thoracic Assessment: Exceptions to WFL(Kyphotic) Lumbar Assessment Lumbar Assessment: Exceptions to WFL(Posterior pelvic tilt.) Postural Control Postural Control: Deficits on  evaluation Trunk Control: Patient with poor static/dynamic sitting balance requiring multimodal cueing as detailed in trunk/postural control section although imporved since initial eval. Righting Reactions: Delayed  Balance Balance Balance Assessed: Yes Static Sitting Balance Static Sitting - Balance Support: Feet supported Static Sitting - Level of Assistance: 5: Stand by assistance Dynamic Sitting Balance Dynamic Sitting - Balance Support: Feet supported;During functional activity Dynamic Sitting - Level of Assistance: 4: Min assist Static Standing Balance Static Standing - Balance Support: During functional activity Static Standing - Level of Assistance: Other (comment)(Min Guard) Dynamic Standing Balance Dynamic Standing - Balance Support: Bilateral upper extremity supported;During functional activity Dynamic Standing - Level of Assistance: 4: Min assist Extremity/Trunk Assessment RUE Assessment RUE Assessment: Within Functional Limits Passive Range of Motion (PROM) Comments: WFL Active Range of Motion (AROM) Comments: WFL at shoulder, elbow, wrist and digtis. General Strength Comments: 4-/5 LUE Assessment Passive Range of Motion (PROM) Comments: WFL; 0-90 degrees shoulder flexion/abduction with increased tone in L elbow and arthritic hands evident by Bouchard's nodes. Active Range of Motion (AROM) Comments: 0-90 degrees shoulder flexion, elbow and digits limited 2/2 increased extensor tone. Patient able to flex/extend digits with increased time and verbal/visual cues. General Strength Comments: 3/5 at shoulder flexion. Unable to assess at elbow secondary to extensor tone.   Adir Schicker R [place]Davis 12/04/2019, 3:55 PM

## 2019-12-04 NOTE — Progress Notes (Addendum)
Patient educated re: Stroke need to know ; she was able to verbalize understanding re: calling 911, diet , taking bp at home; keeping doctors appointment; taking ,medications on time. RN discussed about depression as well. Reinforcement needed. She claims her daughters has been here yesterday and may not be able to come today. Programme researcher, broadcasting/film/video re: stroke.

## 2019-12-04 NOTE — Progress Notes (Signed)
Team Conference Report to Patient/Family  Team Conference discussion was reviewed with the patient and caregiver(s), including goals, any changes in plan of care and target discharge date.  Patient and caregiver express understanding and are in agreement.  The patient has a target discharge date of 12/05/19. HH set up and DME ordered was delivered to the room 12/03/19. Family in for the past two days for education in prep for discharge. Discussed HH services to be initiated at discharge and family is looking into private duty care givers/PACE services at discharge.  Chana Bode B 12/04/2019, 12:16 PM

## 2019-12-05 DIAGNOSIS — G8194 Hemiplegia, unspecified affecting left nondominant side: Secondary | ICD-10-CM

## 2019-12-05 MED ORDER — METOPROLOL TARTRATE 25 MG PO TABS: 25.0000 mg | ORAL_TABLET | Freq: Two times a day (BID) | ORAL | 0 refills | Status: DC

## 2019-12-05 MED ORDER — PAROXETINE HCL 20 MG PO TABS: 20.0000 mg | ORAL_TABLET | Freq: Every day | ORAL | 0 refills | Status: DC

## 2019-12-05 MED ORDER — LORATADINE 10 MG PO TABS: 10.0000 mg | ORAL_TABLET | Freq: Every day | ORAL | 0 refills | Status: AC

## 2019-12-05 MED ORDER — SENNA 8.6 MG PO TABS: 2.0000 | ORAL_TABLET | Freq: Every day | ORAL | 0 refills | Status: DC

## 2019-12-05 MED ORDER — HYDRALAZINE HCL 10 MG PO TABS: 10.0000 mg | ORAL_TABLET | Freq: Three times a day (TID) | ORAL | 0 refills | Status: DC

## 2019-12-05 MED ORDER — PRAVASTATIN SODIUM 40 MG PO TABS: 40.0000 mg | ORAL_TABLET | Freq: Every day | ORAL | 0 refills | Status: AC

## 2019-12-05 MED ORDER — VITAMIN B-12 500 MCG PO TABS: 500.0000 ug | ORAL_TABLET | Freq: Every day | ORAL | 0 refills | Status: DC

## 2019-12-05 NOTE — Discharge Summary (Signed)
Physician Discharge Summary  Patient ID: Sarah Delacruz MRN: [id number] DOB/AGE: March 15, 1924 86 y.o.  Admit date: 11/05/2019 Discharge date: 12/05/2019  Discharge Diagnoses:  Principal Problem:   Acute right MCA stroke [facility]) Active Problems:   Acute cerebral infarction [facility])   Stage 3 chronic kidney disease   Chronic constipation   Chronic diastolic congestive heart failure (HCC)   Essential hypertension   Dysphagia, post-stroke   Left hemiplegia (HCC)   Discharged Condition: Stable   Significant Diagnostic Studies: DG Swallowing Func-Speech Pathology  Result Date: 11/14/2019 Objective Swallowing Evaluation: No data recorded Patient Details Name: Sarah Delacruz MRN: [id number] Date of Birth: Jan 01, 1924 Today's Date: 11/14/2019 Time: 7510-2585 Past Medical History: Past Medical History: Diagnosis Date . Anemia  . Bundle branch block, left  . Chronic diastolic CHF (congestive heart failure) ([place])  . CKD (chronic kidney disease), stage III  . GERD (gastroesophageal reflux disease)  . Hyperlipidemia  . Hypertension  . Pruritus  . Valvular heart disease  Past Surgical History: No past surgical history on file. HPI: Sarah Delacruz is a 86 year old female with history of HTN, anemia, heart murmur who was seen in ED on 10/29/2019 with left hemiparesis, right gaze deviation.  CT head showed acute/early subacute infarct in R-MCA territory involving posterosuperior temporal lobe.  History taken from chart review and daughter due to cognition/dysarthria.  She received tPA and CTA head/neck showed 14 ml core infarct with perfusion deficits in R-MCA territory and occlusion of proximal superior and inferior division of right M2 MCA branches. She was transferred to [facility] for emergent thrombectomy  due to availability of IR there. She underwent thrombectomy by Dr. Arizona Constable and follow up MRI brain showed R-MCA inferior division infarct without hemorrhage. Cardiac echo with ejection fraction of 60 to 65% with  moderate to severe MVR and moderate AVR.  She was started on ASA for secondary stroke prevention and NGT placed for nutritional  support as failed MBS. Repeat MBS done 03/09 revealing moderate oral residue and vallecular stasis with penetration during and after swallow--placed on purees with honey liquids. Paxil added for neuroactivation.  Hospital course further complicated by dysphagia.Patient with resultant left sided weakness with sensory deficits, L-HH with right inattention, pusher tendencies to the left/back, severe dysarthria.  Patient was transferred to [facility] for CIR 11/05/19  Assessment / Plan / Recommendation CHL IP CLINICAL IMPRESSIONS 11/14/2019 Clinical Impression --Pt presents with moderate oral and mild pharyngeal dysphagia, although with significantly improved today in comparison to results of last MBSS in chart at [facility] on 11/04/19. Of note, some anatomical calcification was observed on imaging. Oral phase impairments were significant for lingual pumping of thicker textures including honey thick barium and a pill with puree, as well as intermittent very mild lingual/palatal residue. Pt also demonstrated difficulty with posterior propulsion and bolus cohesion when attempting to consume pill with thin and pill with puree. Pill ultimately expectorated. Pt consistently initiated the swallow sequence at the level of the valleculae or pyriform sinuses, however no aspiration events were observed throughout consumption of thin, honey, or puree. Thin barium did penetrate to the level of the vocal folds X1 and trace penetration above the vocal folds X1 during the swallow, however all penetrated material was completely ejected from the laryngeal vestibule during the swallow. Recommend that pt continue Dys 2 (minced/ground) solids and pills crushed in puree, but upgrade to thin liquids. Please provide full supervision to ensure use of swallow strategies and optimal upright positioning during  intake. Results were  reviewed with pt, RN, and PA. ST will continue to provide skilled interventions to ensure diet safety and efficiency as well as potential for further solid advancement as oral phase impairments improve.  SLP Visit Diagnosis Dysphagia, oropharyngeal phase (R13.12) Attention and concentration deficit following -- Frontal lobe and executive function deficit following -- Impact on safety and function Mild aspiration risk   No flowsheet data found.  No flowsheet data found. CHL IP DIET RECOMMENDATION 11/14/2019 SLP Diet Recommendations Dysphagia 2 (Fine chop) solids;Thin liquid Liquid Administration via Cup;Straw Medication Administration Crushed with puree Compensations Minimize environmental distractions;Slow rate;Small sips/bites;Monitor for anterior loss;Lingual sweep for clearance of pocketing Postural Changes Remain semi-upright after after feeds/meals (Comment);Seated upright at 90 degrees   CHL IP OTHER RECOMMENDATIONS 11/14/2019 Recommended Consults -- Oral Care Recommendations Oral care BID Other Recommendations Have oral suction available   CHL IP FOLLOW UP RECOMMENDATIONS 11/14/2019 Follow up Recommendations Home health SLP;24 hour supervision/assistance   No flowsheet data found.     CHL IP ORAL PHASE 11/14/2019 Oral Phase Impaired Oral - Pudding Teaspoon -- Oral - Pudding Cup -- Oral - Honey Teaspoon -- Oral - Honey Cup Lingual pumping;Holding of bolus;Lingual/palatal residue Oral - Nectar Teaspoon -- Oral - Nectar Cup -- Oral - Nectar Straw -- Oral - Thin Teaspoon -- Oral - Thin Cup Lingual/palatal residue Oral - Thin Straw WFL Oral - Puree -- Oral - Mech Soft -- Oral - Regular -- Oral - Multi-Consistency -- Oral - Pill Reduced posterior propulsion;Decreased bolus cohesion;Holding of bolus Oral Phase - Comment --  CHL IP PHARYNGEAL PHASE 11/14/2019 Pharyngeal Phase Impaired Pharyngeal- Pudding Teaspoon -- Pharyngeal -- Pharyngeal- Pudding Cup -- Pharyngeal -- Pharyngeal- Honey Teaspoon --  Pharyngeal -- Pharyngeal- Honey Cup Delayed swallow initiation-vallecula Pharyngeal -- Pharyngeal- Nectar Teaspoon -- Pharyngeal -- Pharyngeal- Nectar Cup -- Pharyngeal -- Pharyngeal- Nectar Straw -- Pharyngeal -- Pharyngeal- Thin Teaspoon -- Pharyngeal -- Pharyngeal- Thin Cup Delayed swallow initiation-vallecula;Delayed swallow initiation-pyriform sinuses;Penetration/Aspiration during swallow Pharyngeal Material enters airway, CONTACTS cords and then ejected out Pharyngeal- Thin Straw Delayed swallow initiation-vallecula;Delayed swallow initiation-pyriform sinuses;Penetration/Aspiration during swallow Pharyngeal Material enters airway, remains ABOVE vocal cords then ejected out Pharyngeal- Puree -- Pharyngeal -- Pharyngeal- Mechanical Soft -- Pharyngeal -- Pharyngeal- Regular -- Pharyngeal -- Pharyngeal- Multi-consistency -- Pharyngeal -- Pharyngeal- Pill -- Pharyngeal -- Pharyngeal Comment --  CHL IP CERVICAL ESOPHAGEAL PHASE 11/14/2019 Cervical Esophageal Phase WFL Pudding Teaspoon -- Pudding Cup -- Honey Teaspoon -- Honey Cup -- Nectar Teaspoon -- Nectar Cup -- Nectar Straw -- Thin Teaspoon -- Thin Cup -- Thin Straw -- Puree -- Mechanical Soft -- Regular -- Multi-consistency -- Pill -- Cervical Esophageal Comment -- Little Ishikawa 11/14/2019, 9:49 AM               Labs:  Basic Metabolic Panel: BMP Latest Ref Rng & Units 12/03/2019 11/26/2019 11/19/2019  Glucose 70 - 99 mg/dL 91 96 88  BUN 8 - 23 mg/dL 22 14 21   Creatinine 0.44 - 1.00 mg/dL ) 1.93(X) 9.02(I)  Sodium 135 - 145 mmol/L 138 135 139  Potassium 3.5 - 5.1 mmol/L 4.1 3.6 3.6  Chloride 98 - 111 mmol/L 101 99 109  CO2 22 - 32 mmol/L 27 24 22   Calcium 8.9 - 10.3 mg/dL 9.2 8.9 0.97(D)    CBC: CBC Latest Ref Rng & Units 12/01/2019 11/24/2019 11/19/2019  WBC 4.0 - 10.5 K/uL 6.8 5.9 5.7  Hemoglobin 12.0 - 15.0 g/dL 11/26/2019) 11/21/2019) 9.9(M)  Hematocrit 36.0 - 46.0 % 31.1(L) 31.2(L) 29.8(L)  Platelets 150 - 400 K/uL 276 348 304    CBG: No results  for input(s): GLUCAP in the last 168 hours.  Brief HPI:   Sarah Delacruz is a 86 y.o. female with history of HTN, CKD, anemia who was originally admitted on 10/29/2019 with left hemiparesis and right gaze deviation.  CT head showed early subacute infarct in right-MCA territory involving posterior superior temporal lobe.  She received TPA and CTA head/neck showed 14 mL core infarct with perfusion deficits in right-MCA territory with occlusion of proximal superior and inferior division of right M2 MCA branches.  She was transferred to [facility] for thrombectomy due to availability of IR there.  She underwent thrombectomy with follow-up MRI brain showing right-MCA inferior division infarct without hemorrhage.   2D echo showed EF of 60 to 65% with moderate to severe MVR and moderate AVR.  She was started on aspirin for secondary stroke prevention.  Reduce moderate oropharyngeal dysphagia diet was modified to pures with honey liquids.  Patient with resultant left-sided weakness with sensory deficits, left HH with left inattention and pusher tendencies, dysphagia and severe dysarthria.  Therapy evaluations done and patient felt to be a good rehab candidate.  She was transferred to [facility] for intensive rehab program.   Hospital Course: Sarah Delacruz was admitted to rehab 11/05/2019 for inpatient therapies to consist of PT, ST and OT at least three hours five days a week. Past admission physiatrist, therapy team and rehab RN have worked together to provide customized collaborative inpatient rehab. She continues on ASA daily for secondary stroke prevention. Her blood pressures were monitored on TID basis and have gradually improved. She has had issues with acute on chronic renal failure requiring IVF for hydration intermittently. She did developing hypotension therefore antihypertensives have been weaned down. Her blood pressures are well controlled on current regimen and family has been advised to encourage  fluid intake after discharge.   Weights have been monitored daily and no signs of overload noted. Weight is stable at 48.3 kg. Her PPI was resumed to help manage GERD. Po intake has been good. Spasticity is improving and Tizanidine was avoided due to hypotension and baclofen avoided due to CKD. Follow up labs showed that SCr is stabilizing to 1.3. CBC showed drop in Hgb to 9.8 question due to hemodilution. Stool guaiacs X 2 have been negative and no signs of bleeding reported. Her mood has been stable and she has made slow but steady progress during her stay. She will continue to receive follow up HHPT, HHOT and HHST by [facility] after discharge.    Rehab course: During patient's stay in rehab weekly team conferences were held to monitor patient's progress, set goals and discuss barriers to discharge. At admission, patient required total assist with basic ADL tasks and with mobility. She exhibited moderate to severe oropharyngeal dysphagia therefore pured diet honey liquids were recommended.  She exhibited moderate to severe cognitive linguistic deficits with reduced speech intelligibility of approximately 50% at phrase level. She  has had improvement in activity tolerance, balance, postural control as well as ability to compensate for deficits. She has had improvement in functional use LUE  and LLE as well as improvement in awareness. She requires min to mod assist to complete ADL tasks.  She requires min assist for transfers and to ambulate 30-60' with RW and tactile cues for posture and verbal cues for gait/safe use of DME.  her cognitive function has improved and she is able to complete  functional and familiar tasks with min assist. She requires min cues to use speech intelligibility strategies to 90%.  Her swallow function is improved and she was advanced to dysphagia 2 with thin liquids.  Family education was completed with multiple family members about care and assistance needed after  discharge.   Discharge disposition: 01-Home or Self Care  Diet: Home  Special Instructions: 1. Offer fluids between meals. Needs supervision with cues for safety with meals. 2. Toilet during the day for continence.  3. Recommend repeat BMET and CBC in 1-2 weeks   Discharge Instructions    Ambulatory referral to Physical Medicine Rehab   Complete by: As directed    1-2 weeks TC appt     Allergies as of 12/05/2019      Reactions   Sulfa Antibiotics       Medication List    STOP taking these medications   clotrimazole-betamethasone cream Commonly known as: LOTRISONE   docusate 50 MG/5ML liquid Commonly known as: COLACE   ferrous sulfate 325 (65 FE) MG tablet   hydrochlorothiazide 12.5 MG capsule Commonly known as: MICROZIDE   lovastatin 20 MG tablet Commonly known as: MEVACOR   megestrol 400 MG/10ML suspension Commonly known as: MEGACE   polyethylene glycol 17 g packet Commonly known as: MIRALAX / GLYCOLAX   senna 176 MG/5ML Syrp Commonly known as: SENOKOT     TAKE these medications   acetaminophen 325 MG tablet Commonly known as: TYLENOL Take 650 mg by mouth every 6 (six) hours as needed for fever.   aspirin 325 MG tablet Take 325 mg by mouth daily.   calcium-vitamin D 500-200 MG-UNIT tablet Commonly known as: OSCAL WITH D Take 1 tablet by mouth.   hydrALAZINE 10 MG tablet Commonly known as: APRESOLINE Take 1 tablet (10 mg total) by mouth every 8 (eight) hours. What changed:   medication strength  how much to take  when to take this   loratadine 10 MG tablet Commonly known as: CLARITIN Take 1 tablet (10 mg total) by mouth daily.   metoprolol tartrate 25 MG tablet Commonly known as: LOPRESSOR Take 1 tablet (25 mg total) by mouth 2 (two) times daily.   omeprazole 20 MG capsule Commonly known as: PRILOSEC Take 20 mg by mouth daily.   PARoxetine 20 MG tablet Commonly known as: PAXIL Take 1 tablet (20 mg total) by mouth daily. What  changed:   medication strength  how much to take   pravastatin 40 MG tablet Commonly known as: PRAVACHOL Take 1 tablet (40 mg total) by mouth daily after supper. What changed: when to take this   senna 8.6 MG Tabs tablet Commonly known as: SENOKOT Take 2 tablets (17.2 mg total) by mouth daily after supper.   vitamin B-12 500 MCG tablet Commonly known as: CYANOCOBALAMIN Take 1 tablet (500 mcg total) by mouth daily.      Follow-up Information    Kirsteins, Victorino Sparrow, MD Follow up.   Specialty: Physical Medicine and Rehabilitation Why: office will call you with follow up appointment Contact information: [address] [place] [address] [phone]        Barbette Reichmann, MD. Call on 12/05/2019.   Specialty: Internal Medicine Why: for follow up appointment and referral to neurology locally Contact information: [address] [place] [address] [phone]           Signed: Jacquelynn Cree 12/08/2019, 12:14 AM

## 2019-12-05 NOTE — Plan of Care (Signed)
  Problem: Consults Goal: RH STROKE PATIENT EDUCATION Description: See Patient Education module for education specifics  Outcome: Completed/Met   Problem: RH BOWEL ELIMINATION Goal: RH STG MANAGE BOWEL WITH ASSISTANCE Description: STG Manage Bowel with min Assistance. Outcome: Completed/Met Goal: RH STG MANAGE BOWEL W/MEDICATION W/ASSISTANCE Description: STG Manage Bowel with Medication with min Assistance. Outcome: Completed/Met   Problem: RH BLADDER ELIMINATION Goal: RH STG MANAGE BLADDER WITH ASSISTANCE Description: STG Manage Bladder With min Assistance Outcome: Completed/Met   Problem: RH SKIN INTEGRITY Goal: RH STG SKIN FREE OF INFECTION/BREAKDOWN Description: Patients skin will remain free from further infection or breakdown with min assist. Outcome: Completed/Met Goal: RH STG MAINTAIN SKIN INTEGRITY WITH ASSISTANCE Description: STG Maintain Skin Integrity With min Assistance. Outcome: Completed/Met   Problem: RH SAFETY Goal: RH STG ADHERE TO SAFETY PRECAUTIONS W/ASSISTANCE/DEVICE Description: STG Adhere to Safety Precautions With supervision Assistance/Device. Outcome: Completed/Met   Problem: RH KNOWLEDGE DEFICIT Goal: RH STG INCREASE KNOWLEDGE OF HYPERTENSION Description: Patient/caregiver will verbalize understanding of management of HTN including diet, exercise, medications, monitoring, and follow up care with min assist. Outcome: Completed/Met Goal: RH STG INCREASE KNOWLEDGE OF DYSPHAGIA/FLUID INTAKE Description: Patient/caregiver will verbalize understanding of management of dysphagia as guided by SLP. Outcome: Completed/Met Goal: RH STG INCREASE KNOWLEGDE OF HYPERLIPIDEMIA Description: Patient/caregiver will verbalize understanding of management of HLD including diet, exercise, medications, monitoring, and follow up care with min assist. Outcome: Completed/Met

## 2019-12-05 NOTE — Progress Notes (Signed)
Patient discharged home accompanied by daughter. All patient equipment and belongings sent home.

## 2019-12-05 NOTE — Progress Notes (Signed)
[place] PHYSICAL MEDICINE & REHABILITATION PROGRESS NOTE   Subjective/Complaints: Pt feeling well. Right great toe a little sore at nail. Excited about getting home!  ROS: Patient denies fever, rash, sore throat, blurred vision, nausea, vomiting, diarrhea, cough, shortness of breath or chest pain, joint or back pain, headache, or mood change.   Objective: No results found. No results for input(s): WBC, HGB, HCT, PLT in the last 72 hours. Recent Labs    12/03/19 0002  NA 138  K 4.1  CL 101  CO2 27  GLUCOSE 91  BUN 22  CREATININE 1.34*  CALCIUM 9.2    Intake/Output Summary (Last 24 hours) at 12/05/2019 1037 Last data filed at 12/04/2019 1823 Gross per 24 hour  Intake 140 ml  Output --  Net 140 ml     Physical Exam: Vital Signs Blood pressure (!) 122/50, pulse 60, temperature 97.6 F (36.4 C), resp. rate 16, height 5' 6"  (1.676 m), weight 48.3 kg, SpO2 94 %.  Constitutional: No distress . Vital signs reviewed. HEENT: EOMI, oral membranes moist Neck: supple Cardiovascular: RRR without murmur. No JVD    Respiratory/Chest: CTA Bilaterally without wheezes or rales. Normal effort    GI/Abdomen: BS +, non-tender, non-distended Ext: no clubbing, cyanosis, or edema Psych: pleasant and cooperative Skin: No evidence of breakdown, no evidence of rash. Some irritation at medial portion of right great toe nail edge Musculoskeletal: Full range of motion in all 4 extremities. No joint swelling Neuro: LUE: 3- to3/5 delt , biceps triceps , grasp and release LLE: 4-/5 HF, KE, ADF   Successfully described the SLP task she was working on.  Assessment/Plan: 1. Functional deficits secondary to Right MCA infarct  which require 3+ hours per day of interdisciplinary therapy in a comprehensive inpatient rehab setting.  Physiatrist is providing close team supervision and 24 hour management of active medical problems listed below.  Physiatrist and rehab team continue to assess barriers to  discharge/monitor patient progress toward functional and medical goals  Care Tool:  Bathing    Body parts bathed by patient: Chest, Abdomen, Front perineal area, Right upper leg, Left upper leg, Left lower leg, Face, Right lower leg, Left arm   Body parts bathed by helper: Buttocks, Right arm Body parts n/a: Right arm, Left arm, Chest, Abdomen, Right upper leg, Left upper leg, Right lower leg, Left lower leg, Face   Bathing assist Assist Level: Moderate Assistance - Patient 50 - 74%     Upper Body Dressing/Undressing Upper body dressing   What is the patient wearing?: Pull over shirt    Upper body assist Assist Level: Minimal Assistance - Patient > 75%    Lower Body Dressing/Undressing Lower body dressing      What is the patient wearing?: Incontinence brief, Pants     Lower body assist Assist for lower body dressing: Moderate Assistance - Patient 50 - 74%     Toileting Toileting    Toileting assist Assist for toileting: Minimal Assistance - Patient > 75%     Transfers Chair/bed transfer  Transfers assist     Chair/bed transfer assist level: Minimal Assistance - Patient > 75% Chair/bed transfer assistive device: Programmer, multimedia   Ambulation assist   Ambulation activity did not occur: Safety/medical concerns(poor balance/high fall risk)  Assist level: Minimal Assistance - Patient > 75% Assistive device: Walker-rolling Max distance: 15f(able to walk up to 631fthough limited to 3053ft D/C)   Walk 10 feet activity   Assist  Walk 10 feet activity did not occur: Safety/medical concerns(poor balance/high fall risk)  Assist level: Minimal Assistance - Patient > 75% Assistive device: Walker-rolling   Walk 50 feet activity   Assist Walk 50 feet with 2 turns activity did not occur: Safety/medical concerns(poor balance/high fall risk)  Assist level: Minimal Assistance - Patient > 75%(able to walk up to 59f) Assistive device:  Walker-rolling    Walk 150 feet activity   Assist Walk 150 feet activity did not occur: Safety/medical concerns  Assist level: Moderate Assistance - Patient - 50 - 74% Assistive device: Walker-rolling    Walk 10 feet on uneven surface  activity   Assist Walk 10 feet on uneven surfaces activity did not occur: Safety/medical concerns(poor balance/high fall risk)   Assist level: Moderate Assistance - Patient - 50 - 74% Assistive device: WAeronautical engineerWill patient use wheelchair at discharge?: Yes Type of Wheelchair: Manual    Wheelchair assist level: Dependent - Patient 0%(pt not functional at w/c propulsion - requires dependent transport) Max wheelchair distance: 150 ft    Wheelchair 50 feet with 2 turns activity    Assist        Assist Level: Dependent - Patient 0%   Wheelchair 150 feet activity     Assist      Assist Level: Dependent - Patient 0%   Blood pressure (!) 122/50, pulse 60, temperature 97.6 F (36.4 C), resp. rate 16, height 5' 6"  (1.676 m), weight 48.3 kg, SpO2 94 %.   Medical Problem List and Plan: 1.  Left hemiparesis with sensory deficits, L-HH with right inattention, pusher tendencies to the left/back, severe dysarthria secondary to right MCA infarct.  Goals met   -DC home today  Patient to see Dr. RRanell Patrickin the office for transitional care encounter in 1-2 weeks.  2.  Antithrombotics: -DVT/anticoagulation:  Pharmaceutical: Lovenox             -antiplatelet therapy: ASA daily.  3. Pain Management: Tylenol prn.  4. Mood: LCSW to follow for evaluation and support.              -antipsychotic agents: N/A 5. Neuropsych: This patient is? capable of making decisions on her own behalf. 6. Skin/Wound Care: Routine pressure relief measure.  7. Fluids/Electrolytes/Nutrition: Monitor I/Os.   8. HTN:   Vitals:   12/04/19 2107 12/05/19 0437  BP: (!) 129/46 (!) 122/50  Pulse:  60  Resp:  16  Temp:  97.6 F  (36.4 C)  SpO2:  94%   Controlled 4/9, lopressor  25 mg BID HR in 60s. On low side. Will decrease Hydralazine to 156mTID 9. Chronic diastolic CHF/Valvular disease: Monitor weights daily with I/Os. Continue hydralazine, metoprolol, pravastatin and ASA. No ARB/ACE due to CKD.              Daily weights Filed Weights   12/03/19 0336 12/04/19 0508 12/05/19 0437  Weight: 48.8 kg 49 kg 48.3 kg  4/5 down, ? Technique  10. Dyslipidemia: On pravastatin.  11. GERD: resume PPI.  12. Chronic constipation: Continue Senna.  Adjust bowel meds as necessary. 13. CKD III: Monitor renal status with serial checks.    Creat in range of 1.0-1.4 BUN normal 14. GUQV:ZDGLOVFIEontinent  15.  Post stroke dysphagia- given               D2 thins, advance diet as tolerated 16.  Bowel incont lkely multifactorial with reduced awareness, delay in notifying nurse  and difficulty getting g on bedpan   Improving 17. Anemia:   Hemoglobin 9.4 on 3/24,9.7 on 3/29, 9.8 on 4/5  Fecal OB neg , may be dilutional due to IVF 18.  Spasticity-improving avoid tizanidine due to hypotension, avoid baclofen due to CKD  -assess for need for botox as an outpt  LOS: 30 days A FACE TO [place] 12/05/2019, 10:37 AM

## 2019-12-05 NOTE — Discharge Instructions (Signed)
Inpatient Rehab Discharge Instructions  Sarah Delacruz Discharge date and time: 12/05/19   Activities/Precautions/ Functional Status: Activity: no lifting, driving, or strenuous exercise till cleared by MD Diet: low fat, low cholesterol diet--chopped foods. Needs supervision--monitor for pocketing. Meds have to be crushed--give with pudding/puree Wound Care: none needed    Functional status:  ___ No restrictions     ___ Walk up steps independently _X__ 24/7 supervision/assistance   ___ Walk up steps with assistance ___ Intermittent supervision/assistance  ___ Bathe/dress independently ___ Walk with walker     _X__ Bathe/dress with assistance ___ Walk Independently    ___ Shower independently ___ Walk with assistance    ___ Shower with assistance _X__ No alcohol     ___ Return to work/school ________  COMMUNITY REFERRALS UPON DISCHARGE:  Home Health: PT, OT, ST  Agency:[facility] [phone]  Medical Equipment/Items Ordered:W/C, DA BSC, RW,   Agency/Supplier:[facility]   Special Instructions: 1. [facility] to provide PT, OT and ST 2. Offer liquids between meals to keep hydrated. Offer supplements.   STROKE/TIA DISCHARGE INSTRUCTIONS SMOKING Cigarette smoking nearly doubles your risk of having a stroke & is the single most alterable risk factor  If you smoke or have smoked in the last 12 months, you are advised to quit smoking for your health.  Most of the excess cardiovascular risk related to smoking disappears within a year of stopping.  Ask you doctor about anti-smoking medications  [place] Quit Line: 1-800-QUIT NOW  Free Smoking Cessation Classes [phone]  CHOLESTEROL Know your levels; limit fat & cholesterol in your diet  Lipid Panel  No results found for: CHOL, TRIG, HDL, CHOLHDL, VLDL, LDLCALC    Many patients benefit from treatment even if their cholesterol is at goal.  Goal: Total Cholesterol (CHOL) less than  160  Goal:  Triglycerides (TRIG) less than 150  Goal:  HDL greater than 40  Goal:  LDL (LDLCALC) less than 100   BLOOD PRESSURE American Stroke Association blood pressure target is less that 120/80 mm/Hg  Your discharge blood pressure is:  BP: (!) 122/50  Monitor your blood pressure  Limit your salt and alcohol intake  Many individuals will require more than one medication for high blood pressure  DIABETES (A1c is a blood sugar average for last 3 months) Goal HGBA1c is under 7% (HBGA1c is blood sugar average for last 3 months)  Diabetes:     No results found for: HGBA1C   Your HGBA1c can be lowered with medications, healthy diet, and exercise.  Check your blood sugar as directed by your physician  Call your physician if you experience unexplained or low blood sugars.  PHYSICAL ACTIVITY/REHABILITATION Goal is 30 minutes at least 4 days per week  Activity: Increase activity slowly, Therapies: see above Return to work: N/A  Activity decreases your risk of heart attack and stroke and makes your heart stronger.  It helps control your weight and blood pressure; helps you relax and can improve your mood.  Participate in a regular exercise program.  Talk with your doctor about the best form of exercise for you (dancing, walking, swimming, cycling).  DIET/WEIGHT Goal is to maintain a healthy weight  Your discharge diet is:  Diet Order            DIET DYS 2 Room service appropriate? Yes with Assist; Fluid consistency: Thin  Diet effective 0500             liquids Your height is:  Height: 5\' 6"  (167.6 cm) Your current weight is: Weight: 48.3 kg Your Body Mass Index (BMI) is:  BMI (Calculated): 17.19  Following the type of diet specifically designed for you will help prevent another stroke.  You are at goal weight   Your goal Body Mass Index (BMI) is 19-24.  Healthy food habits can help reduce 3 risk factors for stroke:  High cholesterol, hypertension, and excess weight.   RESOURCES Stroke/Support Group:  Call [phone]   STROKE EDUCATION PROVIDED/REVIEWED AND GIVEN TO PATIENT Stroke warning signs and symptoms How to activate emergency medical system (call 911). Medications prescribed at discharge. Need for follow-up after discharge. Personal risk factors for stroke. Pneumonia vaccine given:  Flu vaccine given:  My questions have been answered, the writing is legible, and I understand these instructions.  I will adhere to these goals & educational materials that have been provided to me after my discharge from the hospital.     My questions have been answered and I understand these instructions. I will adhere to these goals and the provided educational materials after my discharge from the hospital.  Patient/Caregiver Signature _______________________________ Date __________  Clinician Signature _______________________________________ Date __________  Please bring this form and your medication list with you to all your follow-up doctor's appointments.

## 2019-12-09 ENCOUNTER — Telehealth: Payer: Self-pay

## 2019-12-09 DIAGNOSIS — I639 Cerebral infarction, unspecified: Secondary | ICD-10-CM

## 2019-12-09 DIAGNOSIS — G8194 Hemiplegia, unspecified affecting left nondominant side: Secondary | ICD-10-CM

## 2019-12-09 NOTE — Telephone Encounter (Signed)
Transitional Care call--Anjail-Granddaughter    1. Are you/is patient experiencing any problems since coming home? No Are there any questions regarding any aspect of care? No 2. Are there any questions regarding medications administration/dosing? No Are meds being taken as prescribed? Yes Patient should review meds with caller to confirm 3. Have there been any falls? No 4. Has Home Health been to the house and/or have they contacted you? Yes,PT has still waiting on OT and speech to contact. I gave her the number to [facility] to see when they will contact them. Also told her she could contact us is do not hear anything. If not, have you tried to contact them? Can we help you contact them? 5. Are bowels and bladder emptying properly? Yes Are there any unexpected incontinence issues? No If applicable, is patient following bowel/bladder programs?  6. Any fevers, problems with breathing, unexpected pain? No 7. Are there any skin problems or new areas of breakdown? No 8. Has the patient/family member arranged specialty MD follow up (ie cardiology/neurology/renal/surgical/etc)? Yes Can we help arrange? 9. Does the patient need any other services or support that we can help arrange? Requesting Hospital Bed 10. Are caregivers following through as expected in assisting the patient? Yes 11. Has the patient quit smoking, drinking alcohol, or using drugs as recommended? Yes, never did before  Appointment time 3:00 pm, arrive time 2:40 pm with Marijo File then Dr. Wynn Banker [phone]

## 2019-12-09 NOTE — Telephone Encounter (Signed)
Grand-daughter requesting a hospital bed: Hospital order placed today.

## 2019-12-10 ENCOUNTER — Telehealth: Payer: Self-pay

## 2019-12-10 NOTE — Telephone Encounter (Signed)
While in conversation on a Transitional Care Call with patients granddaughter a request for a hospital bed was made. Order faxed to [facility]([place]).

## 2019-12-17 ENCOUNTER — Encounter: Payer: Self-pay | Admitting: Registered Nurse

## 2019-12-17 ENCOUNTER — Other Ambulatory Visit: Payer: Self-pay

## 2019-12-17 ENCOUNTER — Encounter: Payer: Medicare Other | Attending: Registered Nurse | Admitting: Registered Nurse

## 2019-12-17 VITALS — BP 137/77 | HR 60 | Temp 97.0°F

## 2019-12-17 DIAGNOSIS — I69391 Dysphagia following cerebral infarction: Secondary | ICD-10-CM

## 2019-12-17 DIAGNOSIS — I63511 Cerebral infarction due to unspecified occlusion or stenosis of right middle cerebral artery: Secondary | ICD-10-CM | POA: Insufficient documentation

## 2019-12-17 DIAGNOSIS — I639 Cerebral infarction, unspecified: Secondary | ICD-10-CM

## 2019-12-17 DIAGNOSIS — I10 Essential (primary) hypertension: Secondary | ICD-10-CM | POA: Insufficient documentation

## 2019-12-17 DIAGNOSIS — G8194 Hemiplegia, unspecified affecting left nondominant side: Secondary | ICD-10-CM | POA: Diagnosis present

## 2019-12-17 NOTE — Progress Notes (Signed)
Subjective:    Patient ID: Sarah Delacruz, female    DOB: [birth year], 86 y.o.   MRN: [id number]  HPI: Sarah Delacruz is a 86 y.o. female who is here for transitional care visit for follow up of her acute right MCA stroke, left hemiplegia and essential hypertension. Sarah Delacruz presented to [facility] on 10/29/2019 via EMS, with complaints of sudden onset of left sided weakness as well as speech disturbance.  CT Head WO Contrast:  IMPRESSION: Acute/early subacute ischemic infarction within the right MCA vascular territory involving the posterosuperior temporal lobe. ASPECTS 8.  Small chronic cortically based infarcts within the mid right frontal lobe and left parietal lobe.  Moderate to advanced chronic small vessel ischemic disease. Chronic appearing lacunar infarct within the right cerebellum.  CT Code Stroke CTA Head W/WO Contrast:  CTA head:  1. Occlusion of proximal superior and inferior division right M2 MCA branch vessels. 2. Calcified plaque within the intracranial internal carotid arteries. Up to moderate stenosis within the left paraclinoid segment. No more than mild stenosis within the intracranial right ICA.  CT perfusion head:  The perfusion software identifies a 14 mL core infarct within the right MCA vascular territory. The perfusion software identifies a 93 mL region of critically hypoperfused parenchyma within the right MCA territory utilizing the Tmax>6 seconds threshold. Reported mismatch volume: 79 mL. Sarah Delacruz received TPA.   Sarah Delacruz was transferred to [facility] for thrombectomy. She was started on aspirin for secondary stroke prevention.   She was admitted to inpatient rehabilitation on 11/05/2019 and discharged home on 12/05/2019. She is receiving Home Health Therapy with [facility]. She denies any pain. She rated her pain 0. Md. Dolores and her daughter reports she is walking with walker with therapist. Also reports she has a  good appetite.   Daughter in room all questions answered.     Pain Inventory Average Pain 0 Pain Right Now 0 My pain is no pain  In the last 24 hours, has pain interfered with the following? General activity 0 Relation with others 0 Enjoyment of life 0 What TIME of day is your pain at its worst? no pain Sleep (in general) Good  Pain is worse with: no pain Pain improves with: no pain Relief from Meds: no pain  Mobility walk with assistance use a walker use a wheelchair  Function not employed: date last employed . I need assistance with the following:  dressing, bathing, toileting and meal prep  Neuro/Psych No problems in this area  Prior Studies transitional  Physicians involved in your care transitional   Family History  Problem Relation Age of Onset  . Cancer Mother   . Heart disease Sister    Social History   Socioeconomic History  . Marital status: Legally Separated    Spouse name: Not on file  . Number of children: Not on file  . Years of education: Not on file  . Highest education level: Not on file  Occupational History  . Not on file  Tobacco Use  . Smoking status: Never Smoker  . Smokeless tobacco: Never Used  Substance and Sexual Activity  . Alcohol use: Not Currently  . Drug use: Never  . Sexual activity: Not Currently  Other Topics Concern  . Not on file  Social History Narrative  . Not on file   Social Determinants of Health   Financial Resource Strain:   . Difficulty of Paying Living Expenses:   Food Insecurity:   .  Worried About Charity fundraiser in the Last Year:   . Arboriculturist in the Last Year:   Transportation Needs:   . Film/video editor (Medical):   Marland Kitchen Lack of Transportation (Non-Medical):   Physical Activity:   . Days of Exercise per Week:   . Minutes of Exercise per Session:   Stress:   . Feeling of Stress :   Social Connections:   . Frequency of Communication with Friends and Family:   . Frequency  of Social Gatherings with Friends and Family:   . Attends Religious Services:   . Active Member of Clubs or Organizations:   . Attends Archivist Meetings:   Marland Kitchen Marital Status:    No past surgical history on file. Past Medical History:  Diagnosis Date  . Anemia   . Bundle branch block, left   . Chronic diastolic CHF (congestive heart failure) ([place])   . CKD (chronic kidney disease), stage III   . GERD (gastroesophageal reflux disease)   . Hyperlipidemia   . Hypertension   . Pruritus   . Valvular heart disease    BP 137/77   Pulse (!) 57   Temp (!) 97 F (36.1 C)   SpO2 98%   Opioid Risk Score:   Fall Risk Score:  `1  Depression screen PHQ 2/9  No flowsheet data found.  Review of Systems  Constitutional: Negative.   HENT: Negative.   Eyes: Negative.   Respiratory: Positive for cough.   Gastrointestinal: Negative.   Endocrine: Negative.   Genitourinary: Negative.   Musculoskeletal: Positive for gait problem.  Allergic/Immunologic: Negative.   Hematological: Negative.   Psychiatric/Behavioral: Negative.   All other systems reviewed and are negative.      Objective:   Physical Exam Vitals and nursing note reviewed.  Constitutional:      Appearance: Normal appearance.  Cardiovascular:     Rate and Rhythm: Normal rate and regular rhythm.     Pulses: Normal pulses.     Heart sounds: Normal heart sounds.  Pulmonary:     Effort: Pulmonary effort is normal.     Breath sounds: Normal breath sounds.  Musculoskeletal:     Cervical back: Normal range of motion and neck supple.     Comments: Normal Muscle Bulk and Muscle Testing Reveals:  Upper Extremities: Right: Full ROM and Muscle Strength 5/5 Left: Decreased ROM 45 Degrees and Muscle Strength 3/5 Lower Extremities: Full ROM and Muscle Strength 5/5 Arrived in wheelchair   Skin:    General: Skin is warm and dry.  Neurological:     Mental Status: She is alert and oriented to person, place, and time.    Psychiatric:        Mood and Affect: Mood normal.        Behavior: Behavior normal.           Assessment & Plan:  1. Acute right MCA stroke: left hemiplegia: Continue Home Health Therapy: [facility].  2. Essential hypertension: Continue current medication regimen. PCP Following.   15 minutes of face to face patient care time was spent during this visit. All questions were encouraged and answered.  F/U with Dr Letta Pate in 4-6 weeks

## 2019-12-25 ENCOUNTER — Telehealth: Payer: Self-pay

## 2019-12-25 NOTE — Telephone Encounter (Signed)
Received a call from the patient's grand-daughter Georgiana Spinner regarding a request for a hospital bed. Reported that PM+R MD and PCP had approved the request however she had not heard anything in regards to an order or delivery of the item in question. Anjail given access number to DME company for the hospital bed and information on how to proceed with communication of the request and the PCP could send support documentation to the vendor for the item in need.

## 2020-01-01 ENCOUNTER — Other Ambulatory Visit: Payer: Self-pay | Admitting: Physical Medicine and Rehabilitation

## 2020-01-02 ENCOUNTER — Other Ambulatory Visit: Payer: Self-pay | Admitting: Physical Medicine and Rehabilitation

## 2020-01-05 ENCOUNTER — Other Ambulatory Visit: Payer: Self-pay | Admitting: Physical Medicine and Rehabilitation

## 2020-01-22 ENCOUNTER — Encounter: Payer: Medicare Other | Attending: Registered Nurse | Admitting: Physical Medicine & Rehabilitation

## 2020-01-22 ENCOUNTER — Encounter: Payer: Self-pay | Admitting: Physical Medicine & Rehabilitation

## 2020-01-22 ENCOUNTER — Other Ambulatory Visit: Payer: Self-pay

## 2020-01-22 VITALS — BP 115/55 | HR 67

## 2020-01-22 DIAGNOSIS — G8194 Hemiplegia, unspecified affecting left nondominant side: Secondary | ICD-10-CM | POA: Insufficient documentation

## 2020-01-22 DIAGNOSIS — I63511 Cerebral infarction due to unspecified occlusion or stenosis of right middle cerebral artery: Secondary | ICD-10-CM | POA: Diagnosis present

## 2020-01-22 DIAGNOSIS — I10 Essential (primary) hypertension: Secondary | ICD-10-CM | POA: Diagnosis present

## 2020-01-22 DIAGNOSIS — I69391 Dysphagia following cerebral infarction: Secondary | ICD-10-CM | POA: Diagnosis present

## 2020-01-22 DIAGNOSIS — I639 Cerebral infarction, unspecified: Secondary | ICD-10-CM | POA: Diagnosis not present

## 2020-01-22 NOTE — Progress Notes (Signed)
Subjective:    Patient ID: Sarah Delacruz, female    DOB: February 26, 1924, 86 y.o.   MRN: [id number] 86 y.o. female with history of HTN, CKD, anemia who was originally admitted on 10/29/2019 with left hemiparesis and right gaze deviation.  CT head showed early subacute infarct in right-MCA territory involving posterior superior temporal lobe.  She received TPA and CTA head/neck showed 14 mL core infarct with perfusion deficits in right-MCA territory with occlusion of proximal superior and inferior division of right M2 MCA branches.  She was transferred to [facility] for thrombectomy due to availability of IR there.  She underwent thrombectomy with follow-up MRI brain showing right-MCA inferior division infarct without hemorrhage.    2D echo showed EF of 60 to 65% with moderate to severe MVR and moderate AVR.  She was started on aspirin for secondary stroke prevention.  Reduce moderate oropharyngeal dysphagia diet was modified to pures with honey liquids.  Patient with resultant left-sided weakness with sensory deficits, left HH with left inattention and pusher tendencies, dysphagia and severe dysarthria.  Admit date: 11/05/2019 Discharge date: 12/05/2019  HPI Stays with her daughter Needs help with dressing  Am Supervision with rolling walker Fatigues easily Family assists with ambulation  On low sodium diet, asking about guidelines Coughs on occ,  Home health is finishing up according to patient and her granddaughter No falls Pain Inventory Average Pain 0 Pain Right Now no pain My pain is no pain  In the last 24 hours, has pain interfered with the following? General activity 0 Relation with others 0 Enjoyment of life 0 What TIME of day is your pain at its worst? no pain Sleep (in general) Good  Pain is worse with: no pain Pain improves with: no pain Relief from Meds: no pain  Mobility walk with assistance use a walker use a wheelchair needs help with  transfers  Function retired  Neuro/Psych trouble walking  Prior Studies Any changes since last visit?  no  Physicians involved in your care Any changes since last visit?  no   Family History  Problem Relation Age of Onset  . Cancer Mother   . Heart disease Sister    Social History   Socioeconomic History  . Marital status: Legally Separated    Spouse name: Not on file  . Number of children: Not on file  . Years of education: Not on file  . Highest education level: Not on file  Occupational History  . Not on file  Tobacco Use  . Smoking status: Never Smoker  . Smokeless tobacco: Never Used  Substance and Sexual Activity  . Alcohol use: Not Currently  . Drug use: Never  . Sexual activity: Not Currently  Other Topics Concern  . Not on file  Social History Narrative  . Not on file   Social Determinants of Health   Financial Resource Strain:   . Difficulty of Paying Living Expenses:   Food Insecurity:   . Worried About Charity fundraiser in the Last Year:   . Arboriculturist in the Last Year:   Transportation Needs:   . Film/video editor (Medical):   Marland Kitchen Lack of Transportation (Non-Medical):   Physical Activity:   . Days of Exercise per Week:   . Minutes of Exercise per Session:   Stress:   . Feeling of Stress :   Social Connections:   . Frequency of Communication with Friends and Family:   . Frequency of Social Gatherings with  Friends and Family:   . Attends Religious Services:   . Active Member of Clubs or Organizations:   . Attends Banker Meetings:   Marland Kitchen Marital Status:    History reviewed. No pertinent surgical history. Past Medical History:  Diagnosis Date  . Anemia   . Bundle branch block, left   . Chronic diastolic CHF (congestive heart failure) ([facility])   . CKD (chronic kidney disease), stage III   . GERD (gastroesophageal reflux disease)   . Hyperlipidemia   . Hypertension   . Pruritus   . Valvular heart disease    There  were no vitals taken for this visit.  Opioid Risk Score:   Fall Risk Score:  `1  Depression screen PHQ 2/9  No flowsheet data found.   Review of Systems  Constitutional: Negative.   HENT: Negative.   Eyes: Negative.   Respiratory: Positive for cough.   Cardiovascular: Negative.   Gastrointestinal: Positive for nausea.  Endocrine: Negative.   Genitourinary: Negative.   Musculoskeletal: Positive for gait problem.  Allergic/Immunologic: Negative.   Hematological: Negative.   Psychiatric/Behavioral: Negative.   All other systems reviewed and are negative.      Objective:   Physical Exam Vitals and nursing note reviewed.  Constitutional:      Appearance: Normal appearance.  Eyes:     Extraocular Movements: Extraocular movements intact.     Conjunctiva/sclera: Conjunctivae normal.     Pupils: Pupils are equal, round, and reactive to light.  Cardiovascular:     Rate and Rhythm: Normal rate and regular rhythm.     Heart sounds: Normal heart sounds. No murmur.  Pulmonary:     Effort: Pulmonary effort is normal. No respiratory distress.     Breath sounds: Normal breath sounds.  Abdominal:     General: Abdomen is flat. Bowel sounds are normal. There is no distension.     Palpations: Abdomen is soft.  Musculoskeletal:     Comments: No pain with upper extremity or lower extremity range of motion There is no evidence of edema in the lower extremities  Neurological:     Mental Status: She is alert and oriented to person, place, and time.     Comments: Motor strength is 3 - at the left deltoid bicep tricep finger flexors and extensors, 4 - at the left hip flexor knee extensor and 3 - at the left ankle dorsiflexor Sensation is intact light touch bilateral upper and lower limbs Decreased fine motor ability with finger to thumb opposition left hand  Psychiatric:        Mood and Affect: Mood normal.        Behavior: Behavior normal.           Assessment & Plan:  #1.  Right  MCA distribution infarct with residual left hemiparesis mainly affecting left upper extremity.  Her sensation appears to have improved. She still requires assistance for ADLs as well as mobility.  She can ambulate short household distances with a walker and supervision from her family. I will write for additional home health PT OT and speech. She still has modified diet and may need another modified barium swallow before for upgrade.  This will be at the discretion of the speech therapist.

## 2020-01-22 NOTE — Patient Instructions (Signed)
DASH Eating Plan DASH stands for "Dietary Approaches to Stop Hypertension." The DASH eating plan is a healthy eating plan that has been shown to reduce high blood pressure (hypertension). It may also reduce your risk for type 2 diabetes, heart disease, and stroke. The DASH eating plan may also help with weight loss. What are tips for following this plan?  General guidelines  Avoid eating more than 2,300 mg (milligrams) of salt (sodium) a day. If you have hypertension, you may need to reduce your sodium intake to 1,500 mg a day.  Limit alcohol intake to no more than 1 drink a day for nonpregnant women and 2 drinks a day for men. One drink equals 12 oz of beer, 5 oz of wine, or 1 oz of hard liquor.  Work with your health care provider to maintain a healthy body weight or to lose weight. Ask what an ideal weight is for you.  Get at least 30 minutes of exercise that causes your heart to beat faster (aerobic exercise) most days of the week. Activities may include walking, swimming, or biking.  Work with your health care provider or diet and nutrition specialist (dietitian) to adjust your eating plan to your individual calorie needs. Reading food labels   Check food labels for the amount of sodium per serving. Choose foods with less than 5 percent of the Daily Value of sodium. Generally, foods with less than 300 mg of sodium per serving fit into this eating plan.  To find whole grains, look for the word "whole" as the first word in the ingredient list. Shopping  Buy products labeled as "low-sodium" or "no salt added."  Buy fresh foods. Avoid canned foods and premade or frozen meals. Cooking  Avoid adding salt when cooking. Use salt-free seasonings or herbs instead of table salt or sea salt. Check with your health care provider or pharmacist before using salt substitutes.  Do not fry foods. Cook foods using healthy methods such as baking, boiling, grilling, and broiling instead.  Cook with  heart-healthy oils, such as olive, canola, soybean, or sunflower oil. Meal planning  Eat a balanced diet that includes: ? 5 or more servings of fruits and vegetables each day. At each meal, try to fill half of your plate with fruits and vegetables. ? Up to 6-8 servings of whole grains each day. ? Less than 6 oz of lean meat, poultry, or fish each day. A 3-oz serving of meat is about the same size as a deck of cards. One egg equals 1 oz. ? 2 servings of low-fat dairy each day. ? A serving of nuts, seeds, or beans 5 times each week. ? Heart-healthy fats. Healthy fats called Omega-3 fatty acids are found in foods such as flaxseeds and coldwater fish, like sardines, salmon, and mackerel.  Limit how much you eat of the following: ? Canned or prepackaged foods. ? Food that is high in trans fat, such as fried foods. ? Food that is high in saturated fat, such as fatty meat. ? Sweets, desserts, sugary drinks, and other foods with added sugar. ? Full-fat dairy products.  Do not salt foods before eating.  Try to eat at least 2 vegetarian meals each week.  Eat more home-cooked food and less restaurant, buffet, and fast food.  When eating at a restaurant, ask that your food be prepared with less salt or no salt, if possible. What foods are recommended? The items listed may not be a complete list. Talk with your dietitian about   what dietary choices are best for you. Grains Whole-grain or whole-wheat bread. Whole-grain or whole-wheat pasta. Brown rice. Oatmeal. Quinoa. Bulgur. Whole-grain and low-sodium cereals. Pita bread. Low-fat, low-sodium crackers. Whole-wheat flour tortillas. Vegetables Fresh or frozen vegetables (raw, steamed, roasted, or grilled). Low-sodium or reduced-sodium tomato and vegetable juice. Low-sodium or reduced-sodium tomato sauce and tomato paste. Low-sodium or reduced-sodium canned vegetables. Fruits All fresh, dried, or frozen fruit. Canned fruit in natural juice (without  added sugar). Meat and other protein foods Skinless chicken or turkey. Ground chicken or turkey. Pork with fat trimmed off. Fish and seafood. Egg whites. Dried beans, peas, or lentils. Unsalted nuts, nut butters, and seeds. Unsalted canned beans. Lean cuts of beef with fat trimmed off. Low-sodium, lean deli meat. Dairy Low-fat (1%) or fat-free (skim) milk. Fat-free, low-fat, or reduced-fat cheeses. Nonfat, low-sodium ricotta or cottage cheese. Low-fat or nonfat yogurt. Low-fat, low-sodium cheese. Fats and oils Soft margarine without trans fats. Vegetable oil. Low-fat, reduced-fat, or light mayonnaise and salad dressings (reduced-sodium). Canola, safflower, olive, soybean, and sunflower oils. Avocado. Seasoning and other foods Herbs. Spices. Seasoning mixes without salt. Unsalted popcorn and pretzels. Fat-free sweets. What foods are not recommended? The items listed may not be a complete list. Talk with your dietitian about what dietary choices are best for you. Grains Baked goods made with fat, such as croissants, muffins, or some breads. Dry pasta or rice meal packs. Vegetables Creamed or fried vegetables. Vegetables in a cheese sauce. Regular canned vegetables (not low-sodium or reduced-sodium). Regular canned tomato sauce and paste (not low-sodium or reduced-sodium). Regular tomato and vegetable juice (not low-sodium or reduced-sodium). Pickles. Olives. Fruits Canned fruit in a light or heavy syrup. Fried fruit. Fruit in cream or butter sauce. Meat and other protein foods Fatty cuts of meat. Ribs. Fried meat. Bacon. Sausage. Bologna and other processed lunch meats. Salami. Fatback. Hotdogs. Bratwurst. Salted nuts and seeds. Canned beans with added salt. Canned or smoked fish. Whole eggs or egg yolks. Chicken or turkey with skin. Dairy Whole or 2% milk, cream, and half-and-half. Whole or full-fat cream cheese. Whole-fat or sweetened yogurt. Full-fat cheese. Nondairy creamers. Whipped toppings.  Processed cheese and cheese spreads. Fats and oils Butter. Stick margarine. Lard. Shortening. Ghee. Bacon fat. Tropical oils, such as coconut, palm kernel, or palm oil. Seasoning and other foods Salted popcorn and pretzels. Onion salt, garlic salt, seasoned salt, table salt, and sea salt. Worcestershire sauce. Tartar sauce. Barbecue sauce. Teriyaki sauce. Soy sauce, including reduced-sodium. Steak sauce. Canned and packaged gravies. Fish sauce. Oyster sauce. Cocktail sauce. Horseradish that you find on the shelf. Ketchup. Mustard. Meat flavorings and tenderizers. Bouillon cubes. Hot sauce and Tabasco sauce. Premade or packaged marinades. Premade or packaged taco seasonings. Relishes. Regular salad dressings. Where to find more information:  National Heart, Lung, and Blood Institute: www.nhlbi.nih.gov  American Heart Association: www.heart.org Summary  The DASH eating plan is a healthy eating plan that has been shown to reduce high blood pressure (hypertension). It may also reduce your risk for type 2 diabetes, heart disease, and stroke.  With the DASH eating plan, you should limit salt (sodium) intake to 2,300 mg a day. If you have hypertension, you may need to reduce your sodium intake to 1,500 mg a day.  When on the DASH eating plan, aim to eat more fresh fruits and vegetables, whole grains, lean proteins, low-fat dairy, and heart-healthy fats.  Work with your health care provider or diet and nutrition specialist (dietitian) to adjust your eating plan to your   individual calorie needs. This information is not intended to replace advice given to you by your health care provider. Make sure you discuss any questions you have with your health care provider. Document Revised: 07/27/2017 Document Reviewed: 08/07/2016 Elsevier Patient Education  2020 [employer]  

## 2020-02-02 ENCOUNTER — Other Ambulatory Visit: Payer: Self-pay | Admitting: Physical Medicine and Rehabilitation

## 2020-02-04 ENCOUNTER — Other Ambulatory Visit: Payer: Self-pay

## 2020-02-04 NOTE — Patient Outreach (Signed)
Telephone outreach to patient's caregiver to obtain mRS was successfully completed. MRS=5  Sarah Delacruz [facility]-Care Management Assistant [phone] 

## 2020-03-01 ENCOUNTER — Encounter: Payer: Self-pay | Admitting: Emergency Medicine

## 2020-03-01 ENCOUNTER — Other Ambulatory Visit: Payer: Self-pay

## 2020-03-01 ENCOUNTER — Inpatient Hospital Stay
Admission: EM | Admit: 2020-03-01 | Discharge: 2020-03-04 | DRG: 065 | Disposition: A | Discharge status: Skilled Nursing Facility | Payer: Medicare Other | Attending: Internal Medicine | Admitting: Internal Medicine

## 2020-03-01 ENCOUNTER — Emergency Department: Payer: Medicare Other

## 2020-03-01 ENCOUNTER — Observation Stay: Payer: Medicare Other

## 2020-03-01 DIAGNOSIS — I63411 Cerebral infarction due to embolism of right middle cerebral artery: Secondary | ICD-10-CM | POA: Diagnosis not present

## 2020-03-01 DIAGNOSIS — F32A Depression, unspecified: Secondary | ICD-10-CM | POA: Diagnosis present

## 2020-03-01 DIAGNOSIS — I5032 Chronic diastolic (congestive) heart failure: Secondary | ICD-10-CM | POA: Diagnosis present

## 2020-03-01 DIAGNOSIS — Z20822 Contact with and (suspected) exposure to covid-19: Secondary | ICD-10-CM | POA: Diagnosis present

## 2020-03-01 DIAGNOSIS — I10 Essential (primary) hypertension: Secondary | ICD-10-CM | POA: Diagnosis not present

## 2020-03-01 DIAGNOSIS — R29703 NIHSS score 3: Secondary | ICD-10-CM | POA: Diagnosis present

## 2020-03-01 DIAGNOSIS — I639 Cerebral infarction, unspecified: Secondary | ICD-10-CM | POA: Diagnosis not present

## 2020-03-01 DIAGNOSIS — I130 Hypertensive heart and chronic kidney disease with heart failure and stage 1 through stage 4 chronic kidney disease, or unspecified chronic kidney disease: Secondary | ICD-10-CM | POA: Diagnosis present

## 2020-03-01 DIAGNOSIS — E875 Hyperkalemia: Secondary | ICD-10-CM | POA: Diagnosis present

## 2020-03-01 DIAGNOSIS — I272 Pulmonary hypertension, unspecified: Secondary | ICD-10-CM | POA: Diagnosis present

## 2020-03-01 DIAGNOSIS — I69354 Hemiplegia and hemiparesis following cerebral infarction affecting left non-dominant side: Secondary | ICD-10-CM

## 2020-03-01 DIAGNOSIS — K219 Gastro-esophageal reflux disease without esophagitis: Secondary | ICD-10-CM | POA: Diagnosis present

## 2020-03-01 DIAGNOSIS — E871 Hypo-osmolality and hyponatremia: Secondary | ICD-10-CM | POA: Diagnosis present

## 2020-03-01 DIAGNOSIS — I69392 Facial weakness following cerebral infarction: Secondary | ICD-10-CM

## 2020-03-01 DIAGNOSIS — N1831 Chronic kidney disease, stage 3a: Secondary | ICD-10-CM | POA: Diagnosis not present

## 2020-03-01 DIAGNOSIS — N183 Chronic kidney disease, stage 3 unspecified: Secondary | ICD-10-CM | POA: Diagnosis present

## 2020-03-01 DIAGNOSIS — Z993 Dependence on wheelchair: Secondary | ICD-10-CM

## 2020-03-01 DIAGNOSIS — Z515 Encounter for palliative care: Secondary | ICD-10-CM

## 2020-03-01 DIAGNOSIS — Z79899 Other long term (current) drug therapy: Secondary | ICD-10-CM

## 2020-03-01 DIAGNOSIS — Z7982 Long term (current) use of aspirin: Secondary | ICD-10-CM

## 2020-03-01 DIAGNOSIS — F329 Major depressive disorder, single episode, unspecified: Secondary | ICD-10-CM | POA: Diagnosis present

## 2020-03-01 DIAGNOSIS — I679 Cerebrovascular disease, unspecified: Secondary | ICD-10-CM | POA: Diagnosis present

## 2020-03-01 DIAGNOSIS — I4891 Unspecified atrial fibrillation: Secondary | ICD-10-CM | POA: Diagnosis present

## 2020-03-01 DIAGNOSIS — Z7189 Other specified counseling: Secondary | ICD-10-CM

## 2020-03-01 DIAGNOSIS — E785 Hyperlipidemia, unspecified: Secondary | ICD-10-CM | POA: Diagnosis present

## 2020-03-01 DIAGNOSIS — I480 Paroxysmal atrial fibrillation: Secondary | ICD-10-CM | POA: Diagnosis present

## 2020-03-01 DIAGNOSIS — Z66 Do not resuscitate: Secondary | ICD-10-CM | POA: Diagnosis not present

## 2020-03-01 HISTORY — DX: Cerebral infarction, unspecified: I63.9

## 2020-03-01 LAB — URINALYSIS, COMPLETE (UACMP) WITH MICROSCOPIC
Bilirubin Urine: NEGATIVE
Glucose, UA: NEGATIVE mg/dL
Ketones, ur: NEGATIVE mg/dL
Nitrite: NEGATIVE
Protein, ur: NEGATIVE mg/dL
Specific Gravity, Urine: 1.004 — ABNORMAL LOW (ref 1.005–1.030)
WBC, UA: >50 WBC/hpf — ABNORMAL HIGH (ref 0–5)
pH: 5 (ref 5.0–8.0)

## 2020-03-01 LAB — BRAIN NATRIURETIC PEPTIDE: B Natriuretic Peptide: 298.6 pg/mL — ABNORMAL HIGH (ref 0.0–100.0)

## 2020-03-01 LAB — BASIC METABOLIC PANEL
Anion gap: 11 (ref 5–15)
BUN: 12 mg/dL (ref 8–23)
CO2: 24 mmol/L (ref 22–32)
Calcium: 8.8 mg/dL — ABNORMAL LOW (ref 8.9–10.3)
Chloride: 88 mmol/L — ABNORMAL LOW (ref 98–111)
Creatinine, Ser: 0.82 mg/dL (ref 0.44–1.00)
GFR calc Af Amer: >60 mL/min (ref >60)
GFR calc non Af Amer: >60 mL/min (ref >60)
Glucose, Bld: 104 mg/dL — ABNORMAL HIGH (ref 70–99)
Potassium: 4.5 mmol/L (ref 3.5–5.1)
Sodium: 123 mmol/L — ABNORMAL LOW (ref 135–145)

## 2020-03-01 LAB — COMPREHENSIVE METABOLIC PANEL
ALT: 10 U/L (ref 0–44)
AST: 19 U/L (ref 15–41)
Albumin: 3.2 g/dL — ABNORMAL LOW (ref 3.5–5.0)
Alkaline Phosphatase: 55 U/L (ref 38–126)
Anion gap: 11 (ref 5–15)
BUN: 13 mg/dL (ref 8–23)
CO2: 24 mmol/L (ref 22–32)
Calcium: 9 mg/dL (ref 8.9–10.3)
Chloride: 91 mmol/L — ABNORMAL LOW (ref 98–111)
Creatinine, Ser: 0.88 mg/dL (ref 0.44–1.00)
GFR calc Af Amer: >60 mL/min (ref >60)
GFR calc non Af Amer: 57 mL/min — ABNORMAL LOW (ref >60)
Glucose, Bld: 104 mg/dL — ABNORMAL HIGH (ref 70–99)
Potassium: 5.4 mmol/L — ABNORMAL HIGH (ref 3.5–5.1)
Sodium: 126 mmol/L — ABNORMAL LOW (ref 135–145)
Total Bilirubin: 0.7 mg/dL (ref 0.3–1.2)
Total Protein: 7.3 g/dL (ref 6.5–8.1)

## 2020-03-01 LAB — CBC WITH DIFFERENTIAL/PLATELET
Abs Immature Granulocytes: 0.06 10*3/uL (ref 0.00–0.07)
Basophils Absolute: 0.1 10*3/uL (ref 0.0–0.1)
Basophils Relative: 1 %
Eosinophils Absolute: 0.7 10*3/uL — ABNORMAL HIGH (ref 0.0–0.5)
Eosinophils Relative: 8 %
HCT: 35.3 % — ABNORMAL LOW (ref 36.0–46.0)
Hemoglobin: 11.8 g/dL — ABNORMAL LOW (ref 12.0–15.0)
Immature Granulocytes: 1 %
Lymphocytes Relative: 31 %
Lymphs Abs: 2.5 10*3/uL (ref 0.7–4.0)
MCH: 29 pg (ref 26.0–34.0)
MCHC: 33.4 g/dL (ref 30.0–36.0)
MCV: 86.7 fL (ref 80.0–100.0)
Monocytes Absolute: 0.4 10*3/uL (ref 0.1–1.0)
Monocytes Relative: 5 %
Neutro Abs: 4.4 10*3/uL (ref 1.7–7.7)
Neutrophils Relative %: 54 %
Platelets: 466 10*3/uL — ABNORMAL HIGH (ref 150–400)
RBC: 4.07 MIL/uL (ref 3.87–5.11)
RDW: 12.9 % (ref 11.5–15.5)
WBC: 8.1 10*3/uL (ref 4.0–10.5)
nRBC: 0 % (ref 0.0–0.2)

## 2020-03-01 LAB — PROTIME-INR
INR: 1.1 (ref 0.8–1.2)
Prothrombin Time: 13.3 seconds (ref 11.4–15.2)

## 2020-03-01 LAB — OSMOLALITY: Osmolality: 259 mOsm/kg — ABNORMAL LOW (ref 275–295)

## 2020-03-01 LAB — APTT: aPTT: 27 seconds (ref 24–36)

## 2020-03-01 LAB — SARS CORONAVIRUS 2 BY RT PCR (HOSPITAL ORDER, PERFORMED IN ~~LOC~~ HOSPITAL LAB): SARS Coronavirus 2: NEGATIVE

## 2020-03-01 LAB — OSMOLALITY, URINE: Osmolality, Ur: 168 mOsm/kg — ABNORMAL LOW (ref 300–900)

## 2020-03-01 LAB — SODIUM, URINE, RANDOM: Sodium, Ur: 32 mmol/L

## 2020-03-01 MED ORDER — HYDRALAZINE HCL 10 MG PO TABS
10.0000 mg | ORAL_TABLET | Freq: Two times a day (BID) | ORAL | Status: DC
  Administered 2020-03-01 – 2020-03-04 (×6): 10 mg via ORAL
  Filled 2020-03-01 (×7): qty 1

## 2020-03-01 MED ORDER — METOPROLOL SUCCINATE ER 50 MG PO TB24
50.0000 mg | ORAL_TABLET | Freq: Every day | ORAL | Status: DC
  Administered 2020-03-01 – 2020-03-04 (×4): 50 mg via ORAL
  Filled 2020-03-01 (×4): qty 1

## 2020-03-01 MED ORDER — SODIUM CHLORIDE 0.9 % IV SOLN: INTRAVENOUS | Status: DC

## 2020-03-01 MED ORDER — LORATADINE 10 MG PO TABS
10.0000 mg | ORAL_TABLET | Freq: Every day | ORAL | Status: DC
  Administered 2020-03-01 – 2020-03-04 (×4): 10 mg via ORAL
  Filled 2020-03-01 (×4): qty 1

## 2020-03-01 MED ORDER — PRAVASTATIN SODIUM 20 MG PO TABS
40.0000 mg | ORAL_TABLET | Freq: Every day | ORAL | Status: DC
  Administered 2020-03-01 – 2020-03-03 (×2): 40 mg via ORAL
  Filled 2020-03-01 (×2): qty 2

## 2020-03-01 MED ORDER — STROKE: EARLY STAGES OF RECOVERY BOOK: Freq: Once | Status: AC

## 2020-03-01 MED ORDER — ENOXAPARIN SODIUM 40 MG/0.4ML ~~LOC~~ SOLN
40.0000 mg | SUBCUTANEOUS | Status: DC
  Administered 2020-03-01 – 2020-03-03 (×3): 40 mg via SUBCUTANEOUS
  Filled 2020-03-01 (×3): qty 0.4

## 2020-03-01 MED ORDER — CALCIUM CARBONATE-VITAMIN D 500-200 MG-UNIT PO TABS
1.0000 | ORAL_TABLET | Freq: Every day | ORAL | Status: DC
  Administered 2020-03-02 – 2020-03-04 (×3): 1 via ORAL
  Filled 2020-03-01 (×3): qty 1

## 2020-03-01 MED ORDER — ONDANSETRON HCL 4 MG/2ML IJ SOLN: 4.0000 mg | Freq: Three times a day (TID) | INTRAMUSCULAR | Status: DC | PRN

## 2020-03-01 MED ORDER — HYDRALAZINE HCL 20 MG/ML IJ SOLN: 5.0000 mg | INTRAMUSCULAR | Status: DC | PRN

## 2020-03-01 MED ORDER — VITAMIN B-12 1000 MCG PO TABS
1000.0000 ug | ORAL_TABLET | Freq: Every day | ORAL | Status: DC
  Administered 2020-03-01 – 2020-03-04 (×4): 1000 ug via ORAL
  Filled 2020-03-01 (×4): qty 1

## 2020-03-01 MED ORDER — ACETAMINOPHEN 325 MG PO TABS: 650.0000 mg | ORAL_TABLET | Freq: Four times a day (QID) | ORAL | Status: DC | PRN

## 2020-03-01 MED ORDER — CLOPIDOGREL BISULFATE 75 MG PO TABS
75.0000 mg | ORAL_TABLET | Freq: Every day | ORAL | Status: DC
  Administered 2020-03-01 – 2020-03-04 (×4): 75 mg via ORAL
  Filled 2020-03-01 (×4): qty 1

## 2020-03-01 MED ORDER — SENNOSIDES-DOCUSATE SODIUM 8.6-50 MG PO TABS: 1.0000 | ORAL_TABLET | Freq: Every evening | ORAL | Status: DC | PRN

## 2020-03-01 MED ORDER — SODIUM POLYSTYRENE SULFONATE 15 GM/60ML PO SUSP
15.0000 g | Freq: Once | ORAL | Status: AC
  Administered 2020-03-01: 15 g via ORAL
  Filled 2020-03-01: qty 60

## 2020-03-01 MED ORDER — PANTOPRAZOLE SODIUM 40 MG PO TBEC
40.0000 mg | DELAYED_RELEASE_TABLET | Freq: Every day | ORAL | Status: DC
  Administered 2020-03-01 – 2020-03-04 (×4): 40 mg via ORAL
  Filled 2020-03-01 (×4): qty 1

## 2020-03-01 MED ORDER — ASPIRIN EC 81 MG PO TBEC
81.0000 mg | DELAYED_RELEASE_TABLET | Freq: Every day | ORAL | Status: DC
  Administered 2020-03-02 – 2020-03-04 (×3): 81 mg via ORAL
  Filled 2020-03-01 (×3): qty 1

## 2020-03-01 NOTE — Plan of Care (Signed)
°  Problem: Education: Goal: Knowledge of General Education information will improve Description: Including pain rating scale, medication(s)/side effects and non-pharmacologic comfort measures Outcome: Progressing   Problem: Health Behavior/Discharge Planning: Goal: Ability to manage health-related needs will improve Outcome: Progressing   Problem: Clinical Measurements: Goal: Ability to maintain clinical measurements within normal limits will improve Outcome: Progressing Goal: Will remain free from infection Outcome: Progressing Goal: Diagnostic test results will improve Outcome: Progressing Goal: Respiratory complications will improve Outcome: Progressing Goal: Cardiovascular complication will be avoided Outcome: Progressing   Problem: Activity: Goal: Risk for activity intolerance will decrease Outcome: Progressing   Problem: Nutrition: Goal: Adequate nutrition will be maintained Outcome: Progressing   Problem: Coping: Goal: Level of anxiety will decrease Outcome: Progressing   Problem: Elimination: Goal: Will not experience complications related to bowel motility Outcome: Progressing Goal: Will not experience complications related to urinary retention Outcome: Progressing   Problem: Pain Managment: Goal: General experience of comfort will improve Outcome: Progressing   Problem: Safety: Goal: Ability to remain free from injury will improve Outcome: Progressing   Problem: Skin Integrity: Goal: Risk for impaired skin integrity will decrease Outcome: Progressing   Problem: Education: Goal: Knowledge of disease or condition will improve Outcome: Progressing Goal: Knowledge of secondary prevention will improve Outcome: Progressing Goal: Knowledge of patient specific risk factors addressed and post discharge goals established will improve Outcome: Progressing Goal: Individualized Educational Video(s) Outcome: Progressing   Problem: Health Behavior/Discharge  Planning: Goal: Ability to manage health-related needs will improve Outcome: Progressing   Problem: Self-Care: Goal: Ability to participate in self-care as condition permits will improve Outcome: Progressing Goal: Verbalization of feelings and concerns over difficulty with self-care will improve Outcome: Progressing   Problem: Nutrition: Goal: Risk of aspiration will decrease Outcome: Progressing

## 2020-03-01 NOTE — ED Notes (Signed)
Pt provided meal at bedside. Pt ate approx. 65% of plate.

## 2020-03-01 NOTE — ED Provider Notes (Signed)
[facility] Emergency Department Provider Note   ____________________________________________   First MD Initiated Contact with Patient 03/01/20 [phone]     (approximate)  I have reviewed the triage vital signs and the nursing notes.   HISTORY  Chief Complaint Weakness   HPI Sarah Delacruz is a 86 y.o. female who went to bed last night at 8 or 9 PM.  When she woke up this morning she was noticed by her family emergency members to be weaker on the left side than she had been and had more slurry speech than she had had.  She has a past history of a stroke with left-sided weakness and slurry speech.  This happened in March.  Patient reports usually she is in a wheelchair at home but can get up with a walker and get around in her room with a walker.         Past Medical History:  Diagnosis Date  . Anemia   . Bundle branch block, left   . Chronic diastolic CHF (congestive heart failure) (HCC)   . CKD (chronic kidney disease), stage III   . GERD (gastroesophageal reflux disease)   . Hyperlipidemia   . Hypertension   . Pruritus   . Stroke (HCC)   . Valvular heart disease     Patient Active Problem List   Diagnosis Date Noted  . Left hemiplegia (HCC) 12/05/2019  . Acute right MCA stroke (HCC) 11/05/2019  . Acute cerebral infarction (HCC)   . Stage 3 chronic kidney disease   . Chronic constipation   . Chronic diastolic congestive heart failure (HCC)   . Essential hypertension   . Dysphagia, post-stroke     History reviewed. No pertinent surgical history.  Prior to Admission medications   Medication Sig Start Date End Date Taking? Authorizing Provider  acetaminophen (TYLENOL) 325 MG tablet Take 650 mg by mouth every 6 (six) hours as needed for fever.    [provider]  aspirin 325 MG tablet Take 325 mg by mouth daily.    [provider]  calcium-vitamin D (OSCAL WITH D) 500-200 MG-UNIT tablet Take 1 tablet by mouth.    [provider]  hydrALAZINE (APRESOLINE) 10 MG tablet Take 1 tablet (10 mg total) by mouth every 8 (eight) hours. 12/05/19   Love, Evlyn Kanner, PA-C  loratadine (CLARITIN) 10 MG tablet Take 1 tablet (10 mg total) by mouth daily. 12/06/19   Love, Evlyn Kanner, PA-C  metoprolol tartrate (LOPRESSOR) 25 MG tablet Take 1 tablet (25 mg total) by mouth 2 (two) times daily. 12/05/19   Love, Evlyn Kanner, PA-C  omeprazole (PRILOSEC) 20 MG capsule Take 20 mg by mouth daily. 09/01/19   [provider]  PARoxetine (PAXIL) 20 MG tablet Take 1 tablet (20 mg total) by mouth daily. 12/06/19   Love, Evlyn Kanner, PA-C  polyethylene glycol (MIRALAX / GLYCOLAX) 17 g packet Take 17 g by mouth daily.    [provider]  pravastatin (PRAVACHOL) 40 MG tablet Take 1 tablet (40 mg total) by mouth daily after supper. 12/05/19   Love, Evlyn Kanner, PA-C  senna (SENOKOT) 8.6 MG TABS tablet Take 2 tablets (17.2 mg total) by mouth daily after supper. Patient not taking: Reported on 01/22/2020 12/05/19   Love, Evlyn Kanner, PA-C  vitamin B-12 (CYANOCOBALAMIN) 500 MCG tablet Take 1 tablet (500 mcg total) by mouth daily. 12/05/19   Love, Evlyn Kanner, PA-C    Allergies Sulfa antibiotics and Doxycycline  Family History  Problem Relation Age of Onset  . Cancer Mother   . Heart disease Sister     Social History Social History   Tobacco Use  . Smoking status: Never Smoker  . Smokeless tobacco: Never Used  Vaping Use  . Vaping Use: Never used  Substance Use Topics  . Alcohol use: Not Currently  . Drug use: Never    Review of Systems  Constitutional: No fever/chills Eyes: No visual changes. ENT: No sore throat. Cardiovascular: Denies chest pain. Respiratory: Denies shortness of breath. Gastrointestinal: No abdominal pain.  No nausea, no vomiting.  No diarrhea.  No constipation. Genitourinary: Negative for dysuria. Musculoskeletal: Negative for back pain. Skin: Negative for rash. Neurological: Negative for headaches, focal weakness     ____________________________________________   PHYSICAL EXAM:  VITAL SIGNS: ED Triage Vitals  Enc Vitals Group     BP      Pulse      Resp      Temp      Temp src      SpO2      Weight      Height      Head Circumference      Peak Flow      Pain Score      Pain Loc      Pain Edu?      Excl. in GC?     Constitutional: Alert and oriented. Well appearing and in no acute distress.  Patient fax and she feels back to baseline. Eyes: Conjunctivae are normal.  Difficult to get patient to follow my finger she is not understanding.  But her extraocular movements due to pain appear to be intact. Head: Atraumatic. Nose: No congestion/rhinnorhea. Mouth/Throat: Mucous membranes are moist.  Oropharynx non-erythematous. Neck: No stridor.  Cardiovascular: Normal rate, regular rhythm. Grossly normal heart sounds.  Good peripheral circulation. Respiratory: Normal respiratory effort.  No retractions. Lungs CTAB. Gastrointestinal: Soft and nontender. No distention. No abdominal bruits. No CVA tenderness. Musculoskeletal: No lower extremity tenderness nor edema.   Neurologic: EMS reports her language is now less slurred than it had been.  She has a left-sided facial droop and left-sided weakness. Skin:  Skin is warm, dry and intact. No rash noted.   ____________________________________________   LABS (all labs ordered are listed, but only abnormal results are displayed)  Labs Reviewed  CBC WITH DIFFERENTIAL/PLATELET - Abnormal; Notable for the following components:      Result Value   Hemoglobin 11.8 (*)    HCT 35.3 (*)    Platelets 466 (*)    Eosinophils Absolute 0.7 (*)    All other components within normal limits  URINALYSIS, COMPLETE (UACMP) WITH MICROSCOPIC - Abnormal; Notable for the following components:   Color, Urine YELLOW (*)    APPearance TURBID (*)    Specific Gravity, Urine 1.004 (*)    Hgb urine dipstick SMALL (*)    Leukocytes,Ua LARGE (*)    WBC, UA >50 (*)     Bacteria, UA MANY (*)    All other components within normal limits  SARS CORONAVIRUS 2 BY RT PCR (HOSPITAL ORDER, PERFORMED IN [facility])  COMPREHENSIVE METABOLIC PANEL   ____________________________________________  EKG  EKG read interpreted by me shows normal sinus rhythm rate of 97 left axis left bundle branch block flipped T waves in V5 and V6 similar to EKG from third March of this year at 1051. ____________________________________________  RADIOLOGY  ED MD interpretation CT read by radiology reviewed by me shows her  old stroke and what appears to be additional new stroke.  Official radiology report(s): CT Head Wo Contrast  Result Date: 03/01/2020 CLINICAL DATA:  86 year old female with increased left side weakness and slurred speech onset this morning. Code stroke presentation in March with right MCA M2 occlusion at that time. EXAM: CT HEAD WITHOUT CONTRAST TECHNIQUE: Contiguous axial images were obtained from the base of the skull through the vertex without intravenous contrast. COMPARISON:  Head CT and CTA CTP 10/29/2019. FINDINGS: Brain: Developing encephalomalacia in the right MCA territory which appears to partially correspond to the areas of early cytotoxic edema on 10/29/2019. There is new involvement of the right inferior frontal gyrus, but this appears to have similar chronic density. However, there is more subacute appearing involvement of the right middle and superior frontal gyrus on series 2, image 20. No associated hemorrhage or mass effect. Furthermore there is also a new but chronic appearing small area of cortical encephalomalacia in the left superior perirolandic cortex on series 2, image 21. No intracranial mass effect. Mild ex vacuo lateral ventricular enlargement since March. Posterior fossa gray-white matter differentiation appears stable including small chronic right cerebellar infarct. ASPECTS 9 (abnormal right M5 segment superimposed on suspected  chronic ischemia elsewhere in the right MCA territory). Vascular: Calcified atherosclerosis at the skull base. No suspicious intracranial vascular hyperdensity. Skull: No acute osseous abnormality identified. Sinuses/Orbits: Visualized paranasal sinuses and mastoids are stable and well pneumatized. Other: Mild leftward gaze deviation, otherwise negative orbits and scalp. IMPRESSION: 1. Suspect acute or subacute on chronic right MCA territory ischemia. The most recent appearing cytotoxic edema is in the right superior/middle frontal gyrus (ASPECTS 9). No hemorrhage or mass effect. 2. Developing encephalomalacia elsewhere in the right frontal lobe thought related to evolution of the March infarct. 3. Small chronic right cerebellar infarct. Electronically Signed   By: Odessa Fleming M.D.   On: 03/01/2020 10:53    ____________________________________________   PROCEDURES  Procedure(s) performed (including Critical Care):  Procedures   ____________________________________________   INITIAL IMPRESSION / ASSESSMENT AND PLAN / ED COURSE Patient with strokelike symptoms worsening of her original stroke and CT that is consistent with this.  Patient's seen in the ER in consult with Dr. Thad Ranger.  Not a TPA candidate at this point.  We do plan to get her in the hospital.             ____________________________________________   FINAL CLINICAL IMPRESSION(S) / ED DIAGNOSES  Final diagnoses:  Cerebrovascular accident (CVA), unspecified mechanism [facility])     ED Discharge Orders    None       Note:  This document was prepared using Dragon voice recognition software and may include unintentional dictation errors.    Arnaldo Natal, MD 03/01/20 1108

## 2020-03-01 NOTE — Consult Note (Addendum)
Requesting Physician: Darnelle Catalan    Chief Complaint: Increased left sided weakness  I have been asked by Dr. Darnelle Catalan to see this patient in consultation for acute stroke.  HPI: Sarah Delacruz is an 86 y.o. 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  Patient was also weaker on the left than her baseline.  With no improvement EMS was called and patient was brought in for evaluation.  Symptoms improving on arrival.    Date last known well: 02/29/2020 Time last known well: Time: 22:00 tPA Given: No: Outside time window, improving symptoms  Modified Rankin: Rankin Score=4  Past Medical History:  Diagnosis Date  . Anemia   . Bundle branch block, left   . Chronic diastolic CHF (congestive heart failure) (HCC)   . CKD (chronic kidney disease), stage III   . GERD (gastroesophageal reflux disease)   . Hyperlipidemia   . Hypertension   . Pruritus   . Stroke (HCC)   . Valvular heart disease     History reviewed. No pertinent surgical history.  Family History  Problem Relation Age of Onset  . Cancer Mother   . Heart disease Sister    Social History:  reports that she has never smoked. She has never used smokeless tobacco. She reports previous alcohol use. She reports that she does not use drugs.  Allergies:  Allergies  Allergen Reactions  . Sulfa Antibiotics   . Doxycycline Nausea Only    Medications: I have reviewed the patient's current medications. Prior to Admission medications   Medication Sig Start Date End Date Taking? Authorizing Provider  acetaminophen (TYLENOL) 325 MG tablet Take 650 mg by mouth every 6 (six) hours as needed for fever.   Yes [provider]  aspirin 325 MG tablet Take 325 mg by mouth daily.   Yes [provider]  calcium-vitamin D (OSCAL WITH  D) 500-200 MG-UNIT tablet Take 1 tablet by mouth.   Yes [provider]  hydrALAZINE (APRESOLINE) 10 MG tablet Take 1 tablet (10 mg total) by mouth every 8 (eight) hours. Patient taking differently: Take 10 mg by mouth in the morning and at bedtime.  12/05/19  Yes Love, Evlyn Kanner, PA-C  loratadine (CLARITIN) 10 MG tablet Take 1 tablet (10 mg total) by mouth daily. 12/06/19  Yes Love, Evlyn Kanner, PA-C  metoprolol succinate (TOPROL-XL) 50 MG 24 hr tablet Take 50 mg by mouth daily. Take with or immediately following a meal.   Yes [provider]  omeprazole (PRILOSEC) 20 MG capsule Take 20 mg by mouth daily as needed (acid reflux).    Yes [provider]  pravastatin (PRAVACHOL) 40 MG tablet Take 1 tablet (40 mg total) by mouth daily after supper. 12/05/19  Yes Love, Evlyn Kanner, PA-C  vitamin B-12 (CYANOCOBALAMIN) 1000 MCG tablet Take 1,000 mcg by mouth daily.   Yes [provider]    ROS: History obtained from the patient  General ROS: negative for - chills, fatigue, fever, night sweats, weight gain or weight loss Psychological ROS: negative for - behavioral disorder, hallucinations, memory difficulties, mood swings or suicidal ideation Ophthalmic ROS: negative for - blurry vision, double vision, eye pain or loss of vision ENT ROS: negative for - epistaxis, nasal discharge, oral lesions, sore throat, tinnitus or vertigo Allergy and Immunology ROS: negative  for - hives or itchy/watery eyes Hematological and Lymphatic ROS: negative for - bleeding problems, bruising or swollen lymph nodes Endocrine ROS: negative for - galactorrhea, hair pattern changes, polydipsia/polyuria or temperature intolerance Respiratory ROS: negative for - cough, hemoptysis, shortness of breath or wheezing Cardiovascular ROS: negative for - chest pain, dyspnea on exertion, edema or irregular heartbeat Gastrointestinal ROS: negative for - abdominal pain, diarrhea, hematemesis, nausea/vomiting or  stool incontinence Genito-Urinary ROS: negative for - dysuria, hematuria, incontinence or urinary frequency/urgency Musculoskeletal ROS: negative for - joint swelling or muscular weakness Neurological ROS: as noted in HPI Dermatological ROS: negative for rash and skin lesion changes  Physical Examination: Blood pressure (!) 191/80, pulse (!) 101, temperature (!) 97.3 F (36.3 C), temperature source Oral, resp. rate (!) 22, height 5\' 6"  (1.676 m), weight 48.3 kg, SpO2 100 %.  HEENT-  Normocephalic, no lesions, without obvious abnormality.  Normal external eye and conjunctiva.  Normal TM's bilaterally.  Normal auditory canals and external ears. Normal external nose, mucus membranes and septum.  Normal pharynx. Cardiovascular- S1, S2 normal, pulses palpable throughout   Lungs- chest clear, no wheezing, rales, normal symmetric air entry Abdomen- soft, non-tender; bowel sounds normal; no masses,  no organomegaly Extremities- no edema Lymph-no adenopathy palpable Musculoskeletal-no joint tenderness, deformity or swelling Skin-warm and dry, no hyperpigmentation, vitiligo, or suspicious lesions  Neurological Examination   Mental Status: Alert, oriented, thought content appropriate.  Speech fluent without evidence of aphasia.  Dysarthric.  Able to follow commands. Cranial Nerves: II: Discs flat bilaterally; Visual fields grossly normal, pupils equal, round, reactive to light and accommodation III,IV, VI: ptosis not present, extra-ocular motions intact bilaterally V,VII: left facial droop, facial light touch sensation normal bilaterally VIII: hearing normal bilaterally IX,X: gag reflex present XI: bilateral shoulder shrug XII: midline tongue extension Motor: Right : Upper extremity   5/5    Left:     Upper extremity   4-/5  Lower extremity   3+/5    Lower extremity   3/5 Increased tone on the left Sensory: Pinprick and light touch intact throughout, bilaterally Deep Tendon Reflexes:  Symmetric throughout Plantars: Right: upgoing   Left: upgoing Gait: not tested due to safety concerns    Laboratory Studies:  Basic Metabolic Panel: No results for input(s): NA, K, CL, CO2, GLUCOSE, BUN, CREATININE, CALCIUM, MG, PHOS in the last 168 hours.  Liver Function Tests: No results for input(s): AST, ALT, ALKPHOS, BILITOT, PROT, ALBUMIN in the last 168 hours. No results for input(s): LIPASE, AMYLASE in the last 168 hours. No results for input(s): AMMONIA in the last 168 hours.  CBC: Recent Labs  Lab 03/01/20 0938  WBC 8.1  NEUTROABS 4.4  HGB 11.8*  HCT 35.3*  MCV 86.7  PLT 466*    Cardiac Enzymes: No results for input(s): CKTOTAL, CKMB, CKMBINDEX, TROPONINI in the last 168 hours.  BNP: Invalid input(s): POCBNP  CBG: No results for input(s): GLUCAP in the last 168 hours.  Microbiology: Results for orders placed or performed during the hospital encounter of 11/05/19  Urine culture     Status: Abnormal   Collection Time: 11/06/19  3:24 AM   Specimen: Urine, Catheterized  Result Value Ref Range Status   Specimen Description URINE, CATHETERIZED  Final   Special Requests   Final    NONE Performed at [facility], [address], [place], [place]    Culture >=100,000 COLONIES/mL [facility] MORGANII (A)  Final   Report Status 11/08/2019 FINAL  Final  Organism ID, Bacteria MORGANELLA MORGANII (A)  Final      Susceptibility   Morganella morganii - MIC*    AMPICILLIN >=32 RESISTANT Resistant     CEFAZOLIN >=64 RESISTANT Resistant     CIPROFLOXACIN <=0.25 SENSITIVE Sensitive     GENTAMICIN <=1 SENSITIVE Sensitive     IMIPENEM 1 SENSITIVE Sensitive     NITROFURANTOIN 128 RESISTANT Resistant     TRIMETH/SULFA <=20 SENSITIVE Sensitive     AMPICILLIN/SULBACTAM 8 SENSITIVE Sensitive     PIP/TAZO <=4 SENSITIVE Sensitive     * >=100,000 COLONIES/mL MORGANELLA MORGANII    Coagulation Studies: No results for input(s): LABPROT, INR in the last 72  hours.  Urinalysis:  Recent Labs  Lab 03/01/20 1020  COLORURINE YELLOW*  LABSPEC 1.004*  PHURINE 5.0  GLUCOSEU NEGATIVE  HGBUR SMALL*  BILIRUBINUR NEGATIVE  KETONESUR NEGATIVE  PROTEINUR NEGATIVE  NITRITE NEGATIVE  LEUKOCYTESUR LARGE*    Lipid Panel: No results found for: CHOL, TRIG, HDL, CHOLHDL, VLDL, LDLCALC  HgbA1C: No results found for: HGBA1C  Urine Drug Screen:  No results found for: LABOPIA, COCAINSCRNUR, LABBENZ, AMPHETMU, THCU, LABBARB  Alcohol Level: No results for input(s): ETH in the last 168 hours.   Imaging: CT Head Wo Contrast  Addendum Date: 03/01/2020   ADDENDUM REPORT: 03/01/2020 11:14 ADDENDUM: Study discussed by telephone with Dr. Dorothea Glassman on 03/01/2020 at 1058 hours. Electronically Signed   By: Odessa Fleming M.D.   On: 03/01/2020 11:14   Result Date: 03/01/2020 CLINICAL DATA:  86 year old female with increased left side weakness and slurred speech onset this morning. Code stroke presentation in March with right MCA M2 occlusion at that time. EXAM: CT HEAD WITHOUT CONTRAST TECHNIQUE: Contiguous axial images were obtained from the base of the skull through the vertex without intravenous contrast. COMPARISON:  Head CT and CTA CTP 10/29/2019. FINDINGS: Brain: Developing encephalomalacia in the right MCA territory which appears to partially correspond to the areas of early cytotoxic edema on 10/29/2019. There is new involvement of the right inferior frontal gyrus, but this appears to have similar chronic density. However, there is more subacute appearing involvement of the right middle and superior frontal gyrus on series 2, image 20. No associated hemorrhage or mass effect. Furthermore there is also a new but chronic appearing small area of cortical encephalomalacia in the left superior perirolandic cortex on series 2, image 21. No intracranial mass effect. Mild ex vacuo lateral ventricular enlargement since March. Posterior fossa gray-white matter differentiation appears  stable including small chronic right cerebellar infarct. ASPECTS 9 (abnormal right M5 segment superimposed on suspected chronic ischemia elsewhere in the right MCA territory). Vascular: Calcified atherosclerosis at the skull base. No suspicious intracranial vascular hyperdensity. Skull: No acute osseous abnormality identified. Sinuses/Orbits: Visualized paranasal sinuses and mastoids are stable and well pneumatized. Other: Mild leftward gaze deviation, otherwise negative orbits and scalp. IMPRESSION: 1. Suspect acute or subacute on chronic right MCA territory ischemia. The most recent appearing cytotoxic edema is in the right superior/middle frontal gyrus (ASPECTS 9). No hemorrhage or mass effect. 2. Developing encephalomalacia elsewhere in the right frontal lobe thought related to evolution of the March infarct. 3. Small chronic right cerebellar infarct. Electronically Signed: By: Odessa Fleming M.D. On: 03/01/2020 10:53    Assessment: 86 y.o. female with a history of HTN, HLD and an infarct in March of 2021 for which she received tPA and underwent thrombectomy with residual left sided weakness who per report of family awakened this morning and her speech was  slurred more than usual.  Patient was also weaker on the left than her baseline.  With no improvement EMS was called.  On arrival patient was improving.  At my evaluation patient was at baseline per family.  At baseline patient mostly wheelchair bound.  On ASA and statin.  Head CT personally reviewed and shows chronic right cerebellar infarct and right frontal lobe encephalomalacia with questionable acute to subacute right MCA infarct but no films from her hospitalization in March for comparison.  Full work up in March was unremarkable.    Stroke Risk Factors - hyperlipidemia and hypertension  Plan: 1. HgbA1c, fasting lipid panel 2. MRI of the brain without contrast 3. PT consult, OT consult, Speech consult 4. Echocardiogram 5. Prophylactic therapy-If new  infarct noted on above imaging would change ASA to Plavix 75 mg daily 6. NPO until RN stroke swallow screen 7. Telemetry monitoring 8. Frequent neuro checks 9. BP control   Thana FarrLeslie Corday Wyka, MD Neurology [phone] 03/01/2020, 1:09 PM

## 2020-03-01 NOTE — Consult Note (Signed)
Brief note Possible recurrent CVA work-up in progress Appreciate neurology's input Await MRI confirmation New onset atrial fibrillation may very well be the etiology of her previous CVA as well as today's Will recommend long-term anticoagulation with Eliquis once cleared by neurology to do so Would probably recommend Eliquis 2.5 twice a day Continue aspirin and Plavix in the interim Agree with echocardiogram for assessment of thrombus or source of emboli Continue rate control for atrial fibrillation Full consult note to be completed later

## 2020-03-01 NOTE — H&P (Signed)
History and Physical    Sarah Delacruz UXL:[id number] DOB: Jan 27, 1924 DOA: 03/01/2020  Referring MD/NP/PA:   PCP: Barbette Reichmann, MD   Patient coming from:  The patient is coming from home.  At baseline, pt is dependent for most of ADL.        Chief Complaint: Worsening left sided weakness and slurred speech  HPI: Sarah Delacruz is a 86 y.o. female with medical history significant of hypertension, hyperlipidemia, stroke with left-sided weakness and slurred speech, GERD, depression, CKD stage III, dCHF, left bundle blockade, anemia, who presents with worsening left-sided weakness, slurred speech.  Per her grandson, patient had a stroke in March 2021 with residual left-sided weakness, left facial droop and slurred speech.  Patient was last known normal at abut 10:00 PM last night. She was found to have worse left arm weakness and more slurred speech this morning.  She does not have vision loss or hearing loss.  She also has chronic left facial droop which seems to be worse per her grandson. Patient does not have chest pain, shortness breath, cough, fever or chills.  No nausea, vomiting, diarrhea, abdominal pain, symptoms of UTI.   ED Course: pt was found to have WBC 8.1, negative COVID-19 PCR, BNP 298, sodium 126, potassium 5.4, positive urinalysis but with squamous cell contamination, renal function okay, temperature normal, blood pressure 189/70, tachycardia, RR 18, oxygen saturation 100% on room air. Pt was found to have new A fib on EKG. Pt placed on med MedSurg bed of observation.  Dr. Thad Ranger of neurology, and Dr. Juliann Pares of cardiology are consulted.  CT-head showed 1. Suspect acute or subacute on chronic right MCA territory  ischemia. The most recent appearing cytotoxic edema is in the right superior/middle frontal gyrus (ASPECTS 9). No hemorrhage or mass effect. 2. Developing encephalomalacia elsewhere in the right frontal lobe thought related to evolution of the March infarct.  3.  Small chronic right cerebellar infarct.  Review of Systems:   General: no fevers, chills, no body weight gain, has fatigue HEENT: no blurry vision, hearing changes or sore throat Respiratory: no dyspnea, coughing, wheezing CV: no chest pain, no palpitations GI: no nausea, vomiting, abdominal pain, diarrhea, constipation GU: no dysuria, burning on urination, increased urinary frequency, hematuria  Ext: no leg edema Neuro: has left sided unilateral weakness, slurred speech, left facial droop, no vision change or hearing loss Skin: no rash, no skin tear. MSK: No muscle spasm, no deformity, no limitation of range of movement in spin Heme: No easy bruising.  Travel history: No recent long distant travel.  Allergy:  Allergies  Allergen Reactions  . Sulfa Antibiotics   . Doxycycline Nausea Only    Past Medical History:  Diagnosis Date  . Anemia   . Bundle branch block, left   . Chronic diastolic CHF (congestive heart failure) (HCC)   . CKD (chronic kidney disease), stage III   . GERD (gastroesophageal reflux disease)   . Hyperlipidemia   . Hypertension   . Pruritus   . Stroke (HCC)   . Valvular heart disease     History reviewed. No pertinent surgical history.  Social History:  reports that she has never smoked. She has never used smokeless tobacco. She reports previous alcohol use. She reports that she does not use drugs.  Family History:  Family History  Problem Relation Age of Onset  . Cancer Mother   . Heart disease Sister      Prior to Admission medications   Medication Sig Start Date  End Date Taking? Authorizing Provider  acetaminophen (TYLENOL) 325 MG tablet Take 650 mg by mouth every 6 (six) hours as needed for fever.    [provider]  aspirin 325 MG tablet Take 325 mg by mouth daily.    [provider]  calcium-vitamin D (OSCAL WITH D) 500-200 MG-UNIT tablet Take 1 tablet by mouth.    [provider]  hydrALAZINE (APRESOLINE) 10 MG  tablet Take 1 tablet (10 mg total) by mouth every 8 (eight) hours. 12/05/19   Love, Evlyn Kanner, PA-C  loratadine (CLARITIN) 10 MG tablet Take 1 tablet (10 mg total) by mouth daily. 12/06/19   Love, Evlyn Kanner, PA-C  metoprolol tartrate (LOPRESSOR) 25 MG tablet Take 1 tablet (25 mg total) by mouth 2 (two) times daily. 12/05/19   Love, Evlyn Kanner, PA-C  omeprazole (PRILOSEC) 20 MG capsule Take 20 mg by mouth daily. 09/01/19   [provider]  PARoxetine (PAXIL) 20 MG tablet Take 1 tablet (20 mg total) by mouth daily. 12/06/19   Love, Evlyn Kanner, PA-C  polyethylene glycol (MIRALAX / GLYCOLAX) 17 g packet Take 17 g by mouth daily.    [provider]  pravastatin (PRAVACHOL) 40 MG tablet Take 1 tablet (40 mg total) by mouth daily after supper. 12/05/19   Love, Evlyn Kanner, PA-C  vitamin B-12 (CYANOCOBALAMIN) 500 MCG tablet Take 1 tablet (500 mcg total) by mouth daily. 12/05/19   Jacquelynn Cree, PA-C    Physical Exam: Vitals:   03/01/20 1130 03/01/20 1330 03/01/20 1400 03/01/20 1430  BP: (!) 191/80 (!) 171/85 (!) 170/65 (!) 173/76  Pulse:  81 80 79  Resp: (!) 22 17 (!) 21 20  Temp:      TempSrc:      SpO2:  99% 100% 97%  Weight:      Height:       General: Not in acute distress HEENT:       Eyes: PERRL, EOMI, no scleral icterus.       ENT: No discharge from the ears and nose, no pharynx injection, no tonsillar enlargement.        Neck: No JVD, no bruit, no mass felt. Heme: No neck lymph node enlargement. Cardiac: S1/S2, RRR, No murmurs, No gallops or rubs. Respiratory: No rales, wheezing, rhonchi or rubs. GI: Soft, nondistended, nontender, no rebound pain, no organomegaly, BS present. GU: No hematuria Ext: No pitting leg edema bilaterally. 1+DP/PT pulse bilaterally. Musculoskeletal: No joint deformities, No joint redness or warmth, no limitation of ROM in spin. Skin: No rashes.  Neuro: Alert, oriented X3, cranial nerves II-XII grossly intact except for left facial droop. She has left arm  spasm.  Strength in all extremities normal.  Psych: Patient is not psychotic, no suicidal or hemocidal ideation.  Labs on Admission: I have personally reviewed following labs and imaging studies  CBC: Recent Labs  Lab 03/01/20 0938  WBC 8.1  NEUTROABS 4.4  HGB 11.8*  HCT 35.3*  MCV 86.7  PLT 466*   Basic Metabolic Panel: Recent Labs  Lab 03/01/20 1239  NA 126*  K 5.4*  CL 91*  CO2 24  GLUCOSE 104*  BUN 13  CREATININE 0.88  CALCIUM 9.0   GFR: Estimated Creatinine Clearance: 30.5 mL/min (by C-G formula based on SCr of 0.88 mg/dL). Liver Function Tests: Recent Labs  Lab 03/01/20 1239  AST 19  ALT 10  ALKPHOS 55  BILITOT 0.7  PROT 7.3  ALBUMIN 3.2*   No results for input(s): LIPASE,  AMYLASE in the last 168 hours. No results for input(s): AMMONIA in the last 168 hours. Coagulation Profile: No results for input(s): INR, PROTIME in the last 168 hours. Cardiac Enzymes: No results for input(s): CKTOTAL, CKMB, CKMBINDEX, TROPONINI in the last 168 hours. BNP (last 3 results) No results for input(s): PROBNP in the last 8760 hours. HbA1C: No results for input(s): HGBA1C in the last 72 hours. CBG: No results for input(s): GLUCAP in the last 168 hours. Lipid Profile: No results for input(s): CHOL, HDL, LDLCALC, TRIG, CHOLHDL, LDLDIRECT in the last 72 hours. Thyroid Function Tests: No results for input(s): TSH, T4TOTAL, FREET4, T3FREE, THYROIDAB in the last 72 hours. Anemia Panel: No results for input(s): VITAMINB12, FOLATE, FERRITIN, TIBC, IRON, RETICCTPCT in the last 72 hours. Urine analysis:    Component Value Date/Time   COLORURINE YELLOW (A) 03/01/2020 1020   APPEARANCEUR TURBID (A) 03/01/2020 1020   LABSPEC 1.004 (L) 03/01/2020 1020   PHURINE 5.0 03/01/2020 1020   GLUCOSEU NEGATIVE 03/01/2020 1020   HGBUR SMALL (A) 03/01/2020 1020   BILIRUBINUR NEGATIVE 03/01/2020 1020   KETONESUR NEGATIVE 03/01/2020 1020   PROTEINUR NEGATIVE 03/01/2020 1020   NITRITE  NEGATIVE 03/01/2020 1020   LEUKOCYTESUR LARGE (A) 03/01/2020 1020   Sepsis Labs: @LABRCNTIP (procalcitonin:4,lacticidven:4) ) Recent Results (from the past 240 hour(s))  SARS Coronavirus 2 by RT PCR (hospital order, performed in [facility] hospital lab) Nasopharyngeal Nasopharyngeal Swab     Status: None   Collection Time: 03/01/20 11:04 AM   Specimen: Nasopharyngeal Swab  Result Value Ref Range Status   SARS Coronavirus 2 NEGATIVE NEGATIVE Final    Comment: (NOTE) SARS-CoV-2 target nucleic acids are NOT DETECTED.  The SARS-CoV-2 RNA is generally detectable in upper and lower respiratory specimens during the acute phase of infection. The lowest concentration of SARS-CoV-2 viral copies this assay can detect is 250 copies / mL. A negative result does not preclude SARS-CoV-2 infection and should not be used as the sole basis for treatment or other patient management decisions.  A negative result may occur with improper specimen collection / handling, submission of specimen other than nasopharyngeal swab, presence of viral mutation(s) within the areas targeted by this assay, and inadequate number of viral copies (<250 copies / mL). A negative result must be combined with clinical observations, patient history, and epidemiological information.  Fact Sheet for Patients:   05/02/20  Fact Sheet for Healthcare Providers: BoilerBrush.com.cy  This test is not yet approved or  cleared by the https://pope.com/ FDA and has been authorized for detection and/or diagnosis of SARS-CoV-2 by FDA under an Emergency Use Authorization (EUA).  This EUA will remain in effect (meaning this test can be used) for the duration of the COVID-19 declaration under Section 564(b)(1) of the Act, 21 U.S.C. section 360bbb-3(b)(1), unless the authorization is terminated or revoked sooner.  Performed at [facility], [address],  [place], [place]      Radiological Exams on Admission: CT Head Wo Contrast  Addendum Date: 03/01/2020   ADDENDUM REPORT: 03/01/2020 11:14 ADDENDUM: Study discussed by telephone with Dr. 05/02/2020 on 03/01/2020 at 1058 hours. Electronically Signed   By: 05/02/2020 M.D.   On: 03/01/2020 11:14   Result Date: 03/01/2020 CLINICAL DATA:  86 year old female with increased left side weakness and slurred speech onset this morning. Code stroke presentation in March with right MCA M2 occlusion at that time. EXAM: CT HEAD WITHOUT CONTRAST TECHNIQUE: Contiguous axial images were obtained from the base of the  skull through the vertex without intravenous contrast. COMPARISON:  Head CT and CTA CTP 10/29/2019. FINDINGS: Brain: Developing encephalomalacia in the right MCA territory which appears to partially correspond to the areas of early cytotoxic edema on 10/29/2019. There is new involvement of the right inferior frontal gyrus, but this appears to have similar chronic density. However, there is more subacute appearing involvement of the right middle and superior frontal gyrus on series 2, image 20. No associated hemorrhage or mass effect. Furthermore there is also a new but chronic appearing small area of cortical encephalomalacia in the left superior perirolandic cortex on series 2, image 21. No intracranial mass effect. Mild ex vacuo lateral ventricular enlargement since March. Posterior fossa gray-white matter differentiation appears stable including small chronic right cerebellar infarct. ASPECTS 9 (abnormal right M5 segment superimposed on suspected chronic ischemia elsewhere in the right MCA territory). Vascular: Calcified atherosclerosis at the skull base. No suspicious intracranial vascular hyperdensity. Skull: No acute osseous abnormality identified. Sinuses/Orbits: Visualized paranasal sinuses and mastoids are stable and well pneumatized. Other: Mild leftward gaze deviation, otherwise negative orbits and  scalp. IMPRESSION: 1. Suspect acute or subacute on chronic right MCA territory ischemia. The most recent appearing cytotoxic edema is in the right superior/middle frontal gyrus (ASPECTS 9). No hemorrhage or mass effect. 2. Developing encephalomalacia elsewhere in the right frontal lobe thought related to evolution of the March infarct. 3. Small chronic right cerebellar infarct. Electronically Signed: By: Odessa FlemingH  Hall M.D. On: 03/01/2020 10:53     EKG: Independently reviewed.  Seems to be new onset atrial fibrillation, old left bundle blockade, LAD, poor R wave progression, QTC 491  Assessment/Plan Principal Problem:   Stroke [facility](HCC) Active Problems:   Chronic diastolic congestive heart failure (HCC)   Essential hypertension   HLD (hyperlipidemia)   Atrial fibrillation (HCC)   CKD (chronic kidney disease), stage IIIa   Depression   Hypokalemia   Hyperkalemia   Possible new Stroke [facility](HCC): CT of head showed possible acute or subacute on chronic right MCA territory  Ischemia. Dr. Thad Rangereynolds of neurology is consulted.  Who recommended MRI of brain and 2D echo.  - Placed on MedSurg bed for observation - will follow up Neurology's Recs.  - Obtain MRI  -will give pavix and add ASA per Dr. Thad Rangereynolds - fasting lipid panel and HbA1c  - 2D transthoracic echocardiography  - swallowing screen. If fails, will get SLP - PT/OT consult  Chronic diastolic congestive heart failure (HCC): 2D echo on 10/30/2019 showed EF 60-65%.  No leg edema or JVD.  CHF is compensated.  Patient is not taking Lasix at home. -Check BNP -->298  Essential hypertension -Hydralazine as needed IV -Continue home hydralazine and metoprolol  HLD (hyperlipidemia) -Pravastatin  New onset atrial fibrillation (HCC): Heart rate 101. No chest pain. CHA2DS2-VASc Score is 7, needs oral anticoagulation. Will defer to cardiology's recommendations.  Dr. Juliann Paresallwood of cardiology is consulted -on metoprolol -Check TSH  CKD (chronic kidney  disease), stage IIIa: Creatinine normal -Follow-up renal function by BMP  Depression -Continue home medications  Hyperkalemia: potassium 5.4 -Kayexalate 15 g -IV fluid: Normal saline 75 cc/h  Hyponatremia: Na 126.  Mental status at baseline - Will check urine sodium, urine osmolality, serum osmolality. - check TSH - IVF: NS at 75 mL/h - f/u by BMP q8h - avoid over correction too fast due to risk of central pontine myelinolysis  Positive UA: Urinalysis positive with turbid appearance, large amount of leukocyte, many bacteria, WBC >50, but patient does not have  symptoms of UTI.  Urine sample is contaminated with squamous cells.  Will not start antibiotics now -Follow-up blood culture     DVT ppx: SQ Lovenox Code Status: Full code per her grandson Family Communication:  Yes, patient's grandson at bed side Disposition Plan:  Anticipate discharge back to previous environment Consults called: Dr. Thad Ranger of neurology, Dr. Juliann Pares of cardiology Admission status: Med-surg bed for obs  Status is: Observation  The patient remains OBS appropriate and will d/c before 2 midnights.  Dispo: The patient is from: Home              Anticipated d/c is to: Home              Anticipated d/c date is: 1 day              Patient currently is not medically stable to d/c.           Date of Service 03/01/2020    Lorretta Harp [facility]   If 7PM-7AM, please contact night-coverage [email or web address] 03/01/2020, 3:21 PM More slurred speech this morning.

## 2020-03-01 NOTE — ED Triage Notes (Addendum)
Patient to ER from home via [facility] for c/o increased left sided weakness and slurred speech. Patient has h/o CVA. CBG 114. S/S began upon waking this am. Micah Flesher to bed at approx 1000pm.

## 2020-03-01 NOTE — ED Notes (Signed)
Pt provided applesauce to eat at bedside.

## 2020-03-02 ENCOUNTER — Inpatient Hospital Stay
Admit: 2020-03-02 | Discharge: 2020-03-02 | Disposition: A | Discharge status: Home / Self Care | Payer: Medicare Other | Attending: Internal Medicine | Admitting: Internal Medicine

## 2020-03-02 ENCOUNTER — Encounter: Payer: Self-pay | Admitting: Internal Medicine

## 2020-03-02 DIAGNOSIS — N1831 Chronic kidney disease, stage 3a: Secondary | ICD-10-CM | POA: Diagnosis present

## 2020-03-02 DIAGNOSIS — I639 Cerebral infarction, unspecified: Secondary | ICD-10-CM | POA: Diagnosis not present

## 2020-03-02 DIAGNOSIS — R29703 NIHSS score 3: Secondary | ICD-10-CM | POA: Diagnosis present

## 2020-03-02 DIAGNOSIS — I272 Pulmonary hypertension, unspecified: Secondary | ICD-10-CM | POA: Diagnosis present

## 2020-03-02 DIAGNOSIS — Z7189 Other specified counseling: Secondary | ICD-10-CM

## 2020-03-02 DIAGNOSIS — I130 Hypertensive heart and chronic kidney disease with heart failure and stage 1 through stage 4 chronic kidney disease, or unspecified chronic kidney disease: Secondary | ICD-10-CM | POA: Diagnosis present

## 2020-03-02 DIAGNOSIS — I480 Paroxysmal atrial fibrillation: Secondary | ICD-10-CM | POA: Diagnosis not present

## 2020-03-02 DIAGNOSIS — I63411 Cerebral infarction due to embolism of right middle cerebral artery: Secondary | ICD-10-CM | POA: Diagnosis present

## 2020-03-02 DIAGNOSIS — F329 Major depressive disorder, single episode, unspecified: Secondary | ICD-10-CM | POA: Diagnosis not present

## 2020-03-02 DIAGNOSIS — I5032 Chronic diastolic (congestive) heart failure: Secondary | ICD-10-CM | POA: Diagnosis not present

## 2020-03-02 DIAGNOSIS — Z515 Encounter for palliative care: Secondary | ICD-10-CM

## 2020-03-02 DIAGNOSIS — E785 Hyperlipidemia, unspecified: Secondary | ICD-10-CM | POA: Diagnosis present

## 2020-03-02 DIAGNOSIS — E875 Hyperkalemia: Secondary | ICD-10-CM | POA: Diagnosis present

## 2020-03-02 DIAGNOSIS — I69392 Facial weakness following cerebral infarction: Secondary | ICD-10-CM | POA: Diagnosis not present

## 2020-03-02 DIAGNOSIS — K219 Gastro-esophageal reflux disease without esophagitis: Secondary | ICD-10-CM | POA: Diagnosis present

## 2020-03-02 DIAGNOSIS — Z993 Dependence on wheelchair: Secondary | ICD-10-CM | POA: Diagnosis not present

## 2020-03-02 DIAGNOSIS — Z7982 Long term (current) use of aspirin: Secondary | ICD-10-CM | POA: Diagnosis not present

## 2020-03-02 DIAGNOSIS — I4891 Unspecified atrial fibrillation: Secondary | ICD-10-CM | POA: Diagnosis not present

## 2020-03-02 DIAGNOSIS — I10 Essential (primary) hypertension: Secondary | ICD-10-CM | POA: Diagnosis not present

## 2020-03-02 DIAGNOSIS — I69354 Hemiplegia and hemiparesis following cerebral infarction affecting left non-dominant side: Secondary | ICD-10-CM | POA: Diagnosis not present

## 2020-03-02 DIAGNOSIS — Z79899 Other long term (current) drug therapy: Secondary | ICD-10-CM | POA: Diagnosis not present

## 2020-03-02 DIAGNOSIS — E871 Hypo-osmolality and hyponatremia: Secondary | ICD-10-CM | POA: Diagnosis present

## 2020-03-02 DIAGNOSIS — Z20822 Contact with and (suspected) exposure to covid-19: Secondary | ICD-10-CM | POA: Diagnosis present

## 2020-03-02 DIAGNOSIS — Z66 Do not resuscitate: Secondary | ICD-10-CM | POA: Diagnosis not present

## 2020-03-02 LAB — BASIC METABOLIC PANEL
Anion gap: 9 (ref 5–15)
Anion gap: 11 (ref 5–15)
Anion gap: 10 (ref 5–15)
BUN: 11 mg/dL (ref 8–23)
BUN: 11 mg/dL (ref 8–23)
BUN: 13 mg/dL (ref 8–23)
CO2: 26 mmol/L (ref 22–32)
CO2: 25 mmol/L (ref 22–32)
CO2: 24 mmol/L (ref 22–32)
Calcium: 8.8 mg/dL — ABNORMAL LOW (ref 8.9–10.3)
Calcium: 9 mg/dL (ref 8.9–10.3)
Calcium: 8.5 mg/dL — ABNORMAL LOW (ref 8.9–10.3)
Chloride: 92 mmol/L — ABNORMAL LOW (ref 98–111)
Chloride: 95 mmol/L — ABNORMAL LOW (ref 98–111)
Chloride: 98 mmol/L (ref 98–111)
Creatinine, Ser: 0.77 mg/dL (ref 0.44–1.00)
Creatinine, Ser: 0.9 mg/dL (ref 0.44–1.00)
Creatinine, Ser: 0.88 mg/dL (ref 0.44–1.00)
GFR calc Af Amer: >60 mL/min (ref >60)
GFR calc Af Amer: >60 mL/min (ref >60)
GFR calc Af Amer: >60 mL/min (ref >60)
GFR calc non Af Amer: >60 mL/min (ref >60)
GFR calc non Af Amer: 55 mL/min — ABNORMAL LOW (ref >60)
GFR calc non Af Amer: 57 mL/min — ABNORMAL LOW (ref >60)
Glucose, Bld: 87 mg/dL (ref 70–99)
Glucose, Bld: 130 mg/dL — ABNORMAL HIGH (ref 70–99)
Glucose, Bld: 126 mg/dL — ABNORMAL HIGH (ref 70–99)
Potassium: 4.2 mmol/L (ref 3.5–5.1)
Potassium: 3.7 mmol/L (ref 3.5–5.1)
Potassium: 3.3 mmol/L — ABNORMAL LOW (ref 3.5–5.1)
Sodium: 127 mmol/L — ABNORMAL LOW (ref 135–145)
Sodium: 131 mmol/L — ABNORMAL LOW (ref 135–145)
Sodium: 132 mmol/L — ABNORMAL LOW (ref 135–145)

## 2020-03-02 LAB — LIPID PANEL
Cholesterol: 133 mg/dL (ref 0–200)
HDL: 51 mg/dL (ref >40)
LDL Cholesterol: 72 mg/dL (ref 0–99)
Total CHOL/HDL Ratio: 2.6 RATIO
Triglycerides: 51 mg/dL (ref <150)
VLDL: 10 mg/dL (ref 0–40)

## 2020-03-02 LAB — HEMOGLOBIN A1C
Hgb A1c MFr Bld: 6.1 % — ABNORMAL HIGH (ref 4.8–5.6)
Mean Plasma Glucose: 128.37 mg/dL

## 2020-03-02 LAB — TSH: TSH: 2.728 u[IU]/mL (ref 0.350–4.500)

## 2020-03-02 NOTE — Evaluation (Signed)
Speech Language Pathology Evaluation Patient Details Name: Sarah Delacruz MRN: [id number] DOB: 10-31-1923 Today's Date: 03/02/2020 Time: 4098-1191 SLP Time Calculation (min) (ACUTE ONLY): 19 min  Problem List:  Patient Active Problem List   Diagnosis Date Noted  . Goals of care, counseling/discussion   . Palliative care by specialist   . DNR (do not resuscitate) discussion   . Stroke (HCC) 03/01/2020  . HLD (hyperlipidemia) 03/01/2020  . Atrial fibrillation (HCC) 03/01/2020  . CKD (chronic kidney disease), stage IIIa 03/01/2020  . Depression 03/01/2020  . Hyponatremia 03/01/2020  . Hyperkalemia 03/01/2020  . Left hemiplegia (HCC) 12/05/2019  . Acute right MCA stroke (HCC) 11/05/2019  . Acute cerebral infarction (HCC)   . Stage 3 chronic kidney disease   . Chronic constipation   . Chronic diastolic congestive heart failure (HCC)   . Essential hypertension   . Dysphagia, post-stroke    Past Medical History:  Past Medical History:  Diagnosis Date  . Anemia   . Bundle branch block, left   . Chronic diastolic CHF (congestive heart failure) (HCC)   . CKD (chronic kidney disease), stage III   . GERD (gastroesophageal reflux disease)   . Hyperlipidemia   . Hypertension   . Pruritus   . Stroke (HCC)   . Valvular heart disease    Past Surgical History: History reviewed. No pertinent surgical history. HPI:  Sarah Delacruz is a 86 y.o. female with medical history significant of hypertension, hyperlipidemia, stroke with left-sided weakness and slurred speech, GERD, depression, CKD stage III, dCHF, left bundle blockade, anemia, who presents with worsening left-sided weakness, slurred speech. Head CT suspicious acute or subacute on chronic right MCA territory  ischemia. The most recent appearing cytotoxic edema is in the right superior/middle frontal gyrus (ASPECTS 9), no hemorrhage or mass effect, developing encephalomalacia elsewhere in the right frontal lobe thought related to evolution  of the March infarct, and small chronic right cerebellar infarct. Suspect new onset atrial fibrillation may very well be the etiology of her previous CVA as well as acute presentation.    Assessment / Plan / Recommendation Clinical Impression  Pt has the ability to effectively communicate wants and needs as well as direct some aspects of care (such as textures of foods). Pt is oriented to herself and situation, able to communicate which family to contact for information and pt is able to follow 1 step directions as well as sustain attention to basic tasks. This Clinical research associate spoke to pt's daughter who confirms that pt is at baseline for speech-language abilities. No further ST services are indicated.     SLP Assessment  SLP Visit Diagnosis: Cognitive communication deficit (R41.841)    Follow Up Recommendations  None    Frequency and Duration  (N/A)   (N/A)      SLP Evaluation Cognition  Overall Cognitive Status: No family/caregiver present to determine baseline cognitive functioning Arousal/Alertness: Awake/alert Orientation Level: Oriented to person;Oriented to place;Oriented to situation Attention: Sustained Sustained Attention: Appears intact Memory:  (intact for basic conepts related to pt) Problem Solving:  (intact for basic activities such as self-feeding)       Comprehension  Auditory Comprehension Overall Auditory Comprehension: Appears within functional limits for tasks assessed Yes/No Questions: Within Functional Limits Commands: Within Functional Limits (single simple)    Expression Expression Primary Mode of Expression: Verbal Verbal Expression Overall Verbal Expression: Appears within functional limits for tasks assessed Written Expression Dominant Hand: Right   Oral / Motor  Oral Motor/Sensory Function Overall Oral Motor/Sensory Function:  (  baseline left facial weakness) Motor Speech Overall Motor Speech: Impaired at baseline Intelligibility: Intelligible (for basic  wants/needs) Motor Planning: Witnin functional limits Motor Speech Errors: Not applicable   GO             Peony Barner B. Dreama Saa M.S., CCC-SLP, [facility] Speech-Language Pathologist Rehabilitation Services Office [phone]          Savi Lastinger Dreama Saa 03/02/2020, 1:01 PM

## 2020-03-02 NOTE — Consult Note (Signed)
Consultation Note Date: 03/02/2020   Patient Name: Sarah Delacruz  DOB: 1924-06-12  MRN: [id number]  Age / Sex: 86 y.o., female  PCP: Tracie Harrier, MD Referring Physician: Ezekiel Slocumb, DO  Reason for Consultation: Establishing goals of care and Psychosocial/spiritual support  HPI/Patient Profile: 86 y.o. female  with past medical history of stroke in March 2021, dHF, CKD 3, HTN/HLD, GERD, valvular heart disease admitted on 03/01/2020 with MRI of the brain shows small acute infarcts in the right ICA and temporoparietal junction.  Embolic etiology likely.   Clinical Assessment and Goals of Care: I have reviewed medical records including EPIC notes, labs and imaging, received report from bedside nursing staff and transition of care team, examined the patient and met at bedside with daughter, peripheral wounds and family pastor to discuss diagnosis prognosis, [place], EOL wishes, disposition and options.  Sarah Delacruz is sitting up in bed.  She is alert, calm and cooperative.  She appears acutely/chronically ill and quite frail.  She is able to make her basic needs known.  She recognizes her pastor, and asks for prayer.  We pray with her.  I introduced Palliative Medicine as specialized medical care for people living with serious illness. It focuses on providing relief from the symptoms and stress of a serious illness.   We discussed a brief life review of the patient.  Sarah Delacruz lives in her own home with her 2 daughters, Peter Congo and Shauna Hugh.  Sarah Delacruz is her main caregiver managing her medications.  Sarah Delacruz is a widow.  As far as functional and nutritional status, Sarah Delacruz is mostly wheelchair-bound, she is transferred to toilet and chair mostly with assistance.  She is not on any specialized diet.  We discussed her current illness and what it means in the larger context of her on-going co-morbidities.  Natural  disease trajectory and expectations at EOL were discussed.  We talked about stroke, sequela of stroke, time for outcomes.  Bedside nursing staff is at bedside assisting with education and support of patient and family.  Advanced directives, concepts specific to code status, were considered and discussed.  We talked about the concept of "treat the treatable, but allowing natural death", DNR.  I share that if we are caring for Sarah Delacruz, checking labs, imagings, giving medications and treatments, and still she worsens, and her heart naturally stops, I worry that CPR and intubation would not be effective and she would die suffering.  Hospice and Palliative Care services outpatient were explained and offered.  Questions and concerns were addressed.  The family was encouraged to call with questions or concerns.   Detail conference with attending, bedside nursing staff, transition of care team related to patient condition, needs, goals of care.  PMT to continue to follow.   HCPOA    NEXT OF KIN -Sarah Delacruz is a widow.  She has 3 daughters, Shauna Hugh and Peter Congo who live in her home and care for her and [place].  She has multiple grandchildren.    SUMMARY OF RECOMMENDATIONS  Continue to treat the treatable. Anticipate returning home with home health services already in place over SNF rehab. PMT to continue CODE STATUS discussions.  Code Status/Advance Care Planning:  Full code - We talk about "treat the treatable, but allow a natural death".  Daughter [place] states that she would want this for herself or her mother, and she will discuss this with her siblings.  Thankfully their family pastor is present, and he agrees that treat the treatable but allowing natural death is a loving way to care for Sarah Delacruz.  PMT to continue to follow.  Symptom Management:   Per hospitalist, no additional needs at this time.  Palliative Prophylaxis:   Oral Care and Turn Reposition  Additional Recommendations  (Limitations, Scope, Preferences):  Treat the treatable, considering CODE STATUS.  Psycho-social/Spiritual:   Desire for further Chaplaincy support:no  Additional Recommendations: Caregiving  Support/Resources and Education on Hospice  Prognosis:   Unable to determine-, based on outcomes.  6 months or less would not be surprising based on recurrent strokes/evolution of stroke, declining functional status.  Discharge Planning: Anticipate home with home health services that are already in place.      Primary Diagnoses: Present on Admission: . Stroke ([place]) . Chronic diastolic congestive heart failure ([place]) . Essential hypertension . HLD (hyperlipidemia) . Atrial fibrillation ([place]) . CKD (chronic kidney disease), stage IIIa . Depression . Hyponatremia . Hyperkalemia   I have reviewed the medical record, interviewed the patient and family, and examined the patient. The following aspects are pertinent.  Past Medical History:  Diagnosis Date  . Anemia   . Bundle branch block, left   . Chronic diastolic CHF (congestive heart failure) ([place])   . CKD (chronic kidney disease), stage III   . GERD (gastroesophageal reflux disease)   . Hyperlipidemia   . Hypertension   . Pruritus   . Stroke ([place])   . Valvular heart disease    Social History   Socioeconomic History  . Marital status: Legally Separated    Spouse name: Not on file  . Number of children: Not on file  . Years of education: Not on file  . Highest education level: Not on file  Occupational History  . Not on file  Tobacco Use  . Smoking status: Never Smoker  . Smokeless tobacco: Never Used  Vaping Use  . Vaping Use: Never used  Substance and Sexual Activity  . Alcohol use: Not Currently  . Drug use: Never  . Sexual activity: Not Currently  Other Topics Concern  . Not on file  Social History Narrative  . Not on file   Social Determinants of Health   Financial Resource Strain:   . Difficulty of Paying  Living Expenses:   Food Insecurity:   . Worried About Charity fundraiser in the Last Year:   . Arboriculturist in the Last Year:   Transportation Needs:   . Film/video editor (Medical):   Marland Kitchen Lack of Transportation (Non-Medical):   Physical Activity:   . Days of Exercise per Week:   . Minutes of Exercise per Session:   Stress:   . Feeling of Stress :   Social Connections:   . Frequency of Communication with Friends and Family:   . Frequency of Social Gatherings with Friends and Family:   . Attends Religious Services:   . Active Member of Clubs or Organizations:   . Attends Archivist Meetings:   Marland Kitchen Marital Status:    Family  History  Problem Relation Age of Onset  . Cancer Mother   . Heart disease Sister    Scheduled Meds: . aspirin EC  81 mg Oral Daily  . calcium-vitamin D  1 tablet Oral Q breakfast  . clopidogrel  75 mg Oral Daily  . enoxaparin (LOVENOX) injection  40 mg Subcutaneous Q24H  . hydrALAZINE  10 mg Oral BID  . loratadine  10 mg Oral Daily  . metoprolol succinate  50 mg Oral Daily  . pantoprazole  40 mg Oral Daily  . pravastatin  40 mg Oral QPC supper  . vitamin B-12  1,000 mcg Oral Daily   Continuous Infusions: . sodium chloride 75 mL/hr at 03/02/20 0400   PRN Meds:.acetaminophen, hydrALAZINE, ondansetron (ZOFRAN) IV, senna-docusate Medications Prior to Admission:  Prior to Admission medications   Medication Sig Start Date End Date Taking? Authorizing Provider  acetaminophen (TYLENOL) 325 MG tablet Take 650 mg by mouth every 6 (six) hours as needed for fever.   Yes [provider]  aspirin 325 MG tablet Take 325 mg by mouth daily.   Yes [provider]  calcium-vitamin D (OSCAL WITH D) 500-200 MG-UNIT tablet Take 1 tablet by mouth.   Yes [provider]  hydrALAZINE (APRESOLINE) 10 MG tablet Take 1 tablet (10 mg total) by mouth every 8 (eight) hours. Patient taking differently: Take 10 mg by mouth in the morning and  at bedtime.  12/05/19  Yes Love, Ivan Anchors, PA-C  loratadine (CLARITIN) 10 MG tablet Take 1 tablet (10 mg total) by mouth daily. 12/06/19  Yes Love, Ivan Anchors, PA-C  metoprolol succinate (TOPROL-XL) 50 MG 24 hr tablet Take 50 mg by mouth daily. Take with or immediately following a meal.   Yes [provider]  omeprazole (PRILOSEC) 20 MG capsule Take 20 mg by mouth daily as needed (acid reflux).    Yes [provider]  pravastatin (PRAVACHOL) 40 MG tablet Take 1 tablet (40 mg total) by mouth daily after supper. 12/05/19  Yes Love, Ivan Anchors, PA-C  vitamin B-12 (CYANOCOBALAMIN) 1000 MCG tablet Take 1,000 mcg by mouth daily.   Yes [provider]   Allergies  Allergen Reactions  . Sulfa Antibiotics   . Doxycycline Nausea Only   Review of Systems  Unable to perform ROS: Age    Physical Exam Vitals and nursing note reviewed.  Constitutional:      General: She is not in acute distress.    Appearance: She is ill-appearing.  HENT:     Head: Atraumatic.     Mouth/Throat:     Mouth: Mucous membranes are moist.  Cardiovascular:     Rate and Rhythm: Normal rate.  Pulmonary:     Effort: Pulmonary effort is normal. No respiratory distress.  Abdominal:     General: Abdomen is flat. There is no distension.     Tenderness: There is no abdominal tenderness. There is no guarding.  Musculoskeletal:        General: No swelling.  Skin:    General: Skin is warm and dry.  Neurological:     Mental Status: She is alert.     Comments: Not asked orientation questions d/t advanced age  Psychiatric:        Mood and Affect: Mood normal.        Behavior: Behavior normal.     Comments: Clam and cooperative, not fearful.      Vital Signs: BP (!) 137/57 (BP Location: Right Arm)   Pulse 72  Temp 97.8 F (36.6 C)   Resp 16   Ht 5' 6" (1.676 m)   Wt 48.3 kg   SpO2 99%   BMI 17.19 kg/m  Pain Scale: 0-10   Pain Score: 0-No pain   SpO2: SpO2: 99 % O2 Device:SpO2: 99 % O2  Flow Rate: .   IO: Intake/output summary:   Intake/Output Summary (Last 24 hours) at 03/02/2020 1227 Last data filed at 03/02/2020 1610 Gross per 24 hour  Intake 888.75 ml  Output 1600 ml  Net -711.25 ml    LBM:   Baseline Weight: Weight: 48.3 kg Most recent weight: Weight: 48.3 kg     Palliative Assessment/Data:   Flowsheet Rows     Most Recent Value  Intake Tab  Referral Department Hospitalist  Unit at Time of Referral Med/Surg Unit  Palliative Care Primary Diagnosis Neurology  Date Notified 03/01/20  Palliative Care Type New Palliative care  Reason for referral Clarify Goals of Care  Date of Admission 03/01/20  Date first seen by Palliative Care 03/02/20  # of days Palliative referral response time 1 Day(s)  # of days IP prior to Palliative referral 0  Clinical Assessment  Palliative Performance Scale Score 30%  Pain Max last 24 hours Not able to report  Pain Min Last 24 hours Not able to report  Dyspnea Max Last 24 Hours Not able to report  Dyspnea Min Last 24 hours Not able to report  Psychosocial & Spiritual Assessment  Palliative Care Outcomes      Time In: 1100 Time Out: 1210 Time Total: 70 minutes Greater than 50%  of this time was spent counseling and coordinating care related to the above assessment and plan.  Signed by: Drue Novel, NP   Please contact Palliative Medicine Team phone at [phone] for questions and concerns.  For individual provider: See Shea Evans

## 2020-03-02 NOTE — Evaluation (Signed)
Clinical/Bedside Swallow Evaluation Patient Details  Name: Sarah Delacruz MRN: [id number] Date of Birth: 1924/06/02  Today's Date: 03/02/2020 Time: SLP Start Time (ACUTE ONLY): 0825 SLP Stop Time (ACUTE ONLY): 0838 SLP Time Calculation (min) (ACUTE ONLY): 13 min  Past Medical History:  Past Medical History:  Diagnosis Date   Anemia    Bundle branch block, left    Chronic diastolic CHF (congestive heart failure) (HCC)    CKD (chronic kidney disease), stage III    GERD (gastroesophageal reflux disease)    Hyperlipidemia    Hypertension    Pruritus    Stroke (H[facility])    Valvular heart disease    Past Surgical History: History reviewed. No pertinent surgical history. HPI:  Sarah Delacruz is a 86 y.o. female with medical history significant of hypertension, hyperlipidemia, stroke with left-sided weakness and slurred speech, GERD, depression, CKD stage III, dCHF, left bundle blockade, anemia, who presents with worsening left-sided weakness, slurred speech. Head CT suspicious acute or subacute on chronic right MCA territory  ischemia. The most recent appearing cytotoxic edema is in the right superior/middle frontal gyrus (ASPECTS 9), no hemorrhage or mass effect, developing encephalomalacia elsewhere in the right frontal lobe thought related to evolution of the March infarct, and small chronic right cerebellar infarct. Suspect new onset atrial fibrillation may very well be the etiology of her previous CVA as well as acute presentation.    Assessment / Plan / Recommendation Clinical Impression  SLP entered pt's room to complete orders for a cognitive linguistic evaluation. Pt's breakfast was present with boiled eggs and toast. Pt immediately stated "I can't eat this." She was attempting to eat the boil egg that had been cut into large pieces. Even with this writer mashing the egg, pt had difficulty orally with bolus cohesion, oral residue after the swallow and poor mastication. Pt stated that  her family "mashes up" her "food really well and puts gravy on it." SLP provided skilled observation of pt consuming puree with thin liquids via cup with no overt s/s of dysphagia or aspiration with these consistencies. To further clarify how "mashed up" pt's food is at baseline, this Clinical research associate contacted pt's daughter. Her daughter reports that they puree all of pt's food and provided medicine crushed in applesauce. At this time, order was changed and nursing was made aware. No further ST intervention is required.  SLP Visit Diagnosis: Dysphagia, oral phase (R13.11)    Aspiration Risk  Mild aspiration risk    Diet Recommendation Dysphagia 1 with thin liquids via cup  Medication Administration: Crushed with puree    Other  Recommendations Oral Care Recommendations: Oral care BID   Follow up Recommendations None      Frequency and Duration  (N/A)   (N/A)       Prognosis   N/A     Swallow Study   General Date of Onset: 03/01/20 HPI: Sarah Delacruz is a 86 y.o. female with medical history significant of hypertension, hyperlipidemia, stroke with left-sided weakness and slurred speech, GERD, depression, CKD stage III, dCHF, left bundle blockade, anemia, who presents with worsening left-sided weakness, slurred speech. Head CT suspicious acute or subacute on chronic right MCA territory  ischemia. The most recent appearing cytotoxic edema is in the right superior/middle frontal gyrus (ASPECTS 9), no hemorrhage or mass effect, developing encephalomalacia elsewhere in the right frontal lobe thought related to evolution of the March infarct, and small chronic right cerebellar infarct. Suspect new onset atrial fibrillation may very well be the etiology of  her previous CVA as well as acute presentation.  Type of Study: Bedside Swallow Evaluation Previous Swallow Assessment: none in chart Diet Prior to this Study: Regular;Thin liquids Temperature Spikes Noted: No Respiratory Status: Room air History of  Recent Intubation: No Behavior/Cognition: Alert;Cooperative;Pleasant mood Oral Cavity Assessment: Within Functional Limits Oral Care Completed by SLP: No Oral Cavity - Dentition: Edentulous Vision: Functional for self-feeding Self-Feeding Abilities: Able to feed self Patient Positioning: Upright in bed Baseline Vocal Quality: Normal Volitional Cough: Strong Volitional Swallow: Able to elicit    Oral/Motor/Sensory Function Overall Oral Motor/Sensory Function:  (mild baseline left weakness)   Ice Chips Ice chips: Not tested   Thin Liquid Thin Liquid: Within functional limits Presentation: Cup;Self Fed    Nectar Thick Nectar Thick Liquid: Not tested   Honey Thick Honey Thick Liquid: Not tested   Puree Puree: Within functional limits Presentation: Self Fed;Spoon   Solid     Solid: Impaired Presentation: Self Fed Oral Phase Impairments: Reduced lingual movement/coordination;Impaired mastication Oral Phase Functional Implications: Prolonged oral transit;Oral residue;Impaired mastication     Sarah Delacruz B. Dreama Saa M.S., CCC-SLP, [facility] Speech-Language Pathologist Rehabilitation Services Office [phone]  Sarah Delacruz Dreama Saa 03/02/2020,12:54 PM

## 2020-03-02 NOTE — Evaluation (Signed)
Occupational Therapy Evaluation Patient Details Name: Sarah Delacruz MRN: [id number] DOB: March 18, 1924 Today's Date: 03/02/2020    History of Present Illness Patient is a 86 y.o. 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  Patient was also weaker on the left than her baseline. Head CT reprot chronic right cerebellar infarct and questionable acute to subacute right MCA infarct. Further work up ongoing    Clinical Impression   Ms Mcandrew was seen for OT evaluation this date. Prior to hospital admission, pt was requiring assist for transfers, using w/c and RW for mobility and assist from daughters for ADLs. Pt lives c 2 daughters. Pt presents to acute OT demonstrating impaired ADL performance and functional mobility 2/2 functional strength/ROM/balance deficits, impaired tone, impaired functional use of non-dominant LUE, and decreased activity tolerance. Pt currently requires TOTAL A face washing LUE c HOH assist, SETUP face washing c RUE. MOD A + rail for L rolling at bed level for bed pan c MAX A for pericare. MAX A for rolling to right. Andticipate TOTAL A for LLE LBD at bed level. Pt would benefit from skilled OT to address noted impairments and functional limitations (see below for any additional details) in order to maximize safety and independence while minimizing falls risk and caregiver burden. Upon hospital discharge, recommend STR to maximize pt safety and return to PLOF. Per conversation c daughter in room and on phone, family will likely take pt home - recommend hoyer lift for safe ADL transfers.      Follow Up Recommendations  SNF    Equipment Recommendations  Other (comment) Michiel Sites lift )    Recommendations for Other Services       Precautions / Restrictions Precautions Precautions:  Fall Restrictions Weight Bearing Restrictions: No      Mobility Bed Mobility Overal bed mobility: Needs Assistance Bed Mobility: Rolling Rolling: Mod assist;Max assist         General bed mobility comments: MOD A + rail for rolling to left and MAX A for rolling to right   Transfers                 General transfer comment: not safe to attempt     Balance Overall balance assessment: Needs assistance                             High Level Balance Comments: Not attempted this date            ADL either performed or assessed with clinical judgement   ADL Overall ADL's : Needs assistance/impaired                                       General ADL Comments: TOTAL A face washing LUE c HOH assist, SETUP face washing c RUE. MOD A L rolling at bed level for bed pan c MAX A for pericare. Andticipate TOTAL A for LLE LBD at bed level.      Vision   Additional Comments: Dtr reports visual deficits unspecified      Perception     Praxis      Pertinent Vitals/Pain Pain Assessment: No/denies pain  Hand Dominance Right   Extremity/Trunk Assessment Upper Extremity Assessment Upper Extremity Assessment: LUE deficits/detail LUE Deficits / Details: Hypertonicity in LUE - AAROM limited ~20* shoulder flexion and ~10* bicep flexion. PROM shoulder flexion ~20-90*, bicep flexion ~20-100*. MMT 0/5 grossly    Lower Extremity Assessment Lower Extremity Assessment: Defer to PT evaluation       Communication Communication Communication: Other (comment);Expressive difficulties (previous stroke with left facial droop, slurred speech )   Cognition Arousal/Alertness: Awake/alert Behavior During Therapy: WFL for tasks assessed/performed Overall Cognitive Status: History of cognitive impairments - at baseline                                 General Comments: STM deficits noted - pt often mistaking NT and OT as friends from church     General Comments       Exercises Exercises: Other exercises Other Exercises Other Exercises: Pt and caregiver educated re: OT role, DME recs, d/c recs, falls prevention, joint protection strategies, importance of mobility for strengthening Other Exercises: Toileting, LBD, self-drinking, grooming, rolling   Shoulder Instructions      Home Living Family/patient expects to be discharged to:: Private residence Living Arrangements: Children (2 daughters ) Available Help at Discharge: Family;Available 24 hours/day (Dtr reports someone home with her alomst 24/7) Type of Home: House Home Access: Stairs to enter [employer] of Steps: 3 steps with rail on right at back and  2 steps in front without rail Entrance Stairs-Rails: Right Home Layout: Able to live on main level with bedroom/bathroom     Bathroom Shower/Tub: Tub/shower unit;Curtain (sponge bathes)   Bathroom Toilet: Standard     Home Equipment: Wheelchair - [employer] - 2 wheels;Bedside commode;Hospital bed      Lives With: Daughter    Prior Functioning/Environment Level of Independence: Needs assistance  Gait / Transfers Assistance Needed: Per dtr, pt uses RW for mobility and assist for SPT to/from [facility]. Requires assist to get OOB ADL's / Homemaking Assistance Needed: Assist for dressing, bathing, toileting             OT Problem List: Decreased strength;Decreased range of motion;Decreased activity tolerance;Impaired balance (sitting and/or standing);Decreased safety awareness;Impaired UE functional use      OT Treatment/Interventions: Self-care/ADL training;Therapeutic exercise;Neuromuscular education;Energy conservation;DME and/or AE instruction;Therapeutic activities;Visual/perceptual remediation/compensation;Patient/family education;Balance training    OT Goals(Current goals can be found in the care plan section) Acute Rehab OT Goals Patient Stated Goal: to go home with family  OT Goal Formulation:  With patient/family Time For Goal Achievement: 03/16/20 Potential to Achieve Goals: Good ADL Goals Pt Will Perform Eating: with set-up;with supervision;bed level Pt Will Transfer to Toilet: with min guard assist (rolling at bed level) Pt/caregiver will Perform Home Exercise Program: Increased ROM;Left upper extremity;With Supervision  OT Frequency: Min 1X/week   Barriers to D/C: Inaccessible home environment          Co-evaluation              AM-PAC OT "6 Clicks" Daily Activity     Outcome Measure Help from another person eating meals?: A Little Help from another person taking care of personal grooming?: A Little Help from another person toileting, which includes using toliet, bedpan, or urinal?: A Lot Help from another person bathing (including washing, rinsing, drying)?: A Lot Help from another person to put on and taking off regular upper body clothing?: A Lot Help from another person to put on and  taking off regular lower body clothing?: A Lot 6 Click Score: 14   End of Session    Activity Tolerance: Patient tolerated treatment well Patient left: in bed;with call bell/phone within reach;with bed alarm set;with family/visitor present  OT Visit Diagnosis: Unsteadiness on feet (R26.81);Other abnormalities of gait and mobility (R26.89);Hemiplegia and hemiparesis;Muscle weakness (generalized) (M62.81) Hemiplegia - Right/Left: Left Hemiplegia - dominant/non-dominant: Non-Dominant Hemiplegia - caused by: Cerebral infarction                Time: 9604-5409 OT Time Calculation (min): 52 min Charges:  OT General Charges $OT Visit: 1 Visit OT Evaluation $OT Eval Moderate Complexity: 1 Mod OT Treatments $Self Care/Home Management : 23-37 mins $Therapeutic Activity: 8-22 mins  Kathie Dike, M.S. OTR/L  03/02/20, 3:38 PM

## 2020-03-02 NOTE — Hospital Course (Signed)
Sarah Delacruz is a 86 y.o. female with medical history of hypertension, hyperlipidemia, stroke in March with residual left-sided weakness and slurred speech, GERD, depression, CKD stage III, dCHF, left bundle blockade, anemia, who presented to the ED on 03/01/20 with worsening left-sided weakness, slurred speech. Last known normal at her recent baseline at abut 10:00 PM last night, then noted to have worse left arm weakness and more slurred speech, and possible worsened left facial droop the next morning.  In the ED, BP 189/70, tachycardic other vitals normal.  Labs were notable for BNP 298, sodium 126, K 5.4, no leukocytosis.  EKG showed new onset atrial fibrillation.  CT head showed suspected acute or subacute on chronic right MCA territory ischemia in addition to sequelae of her prior stroke.  Admitted to hospitalist service with neurology and cardiology consulted.

## 2020-03-02 NOTE — Evaluation (Signed)
Physical Therapy Evaluation Patient Details Name: Sarah Delacruz MRN: [id number] DOB: Mar 11, 1924 Today's Date: 03/02/2020   History of Present Illness  Patient is a 86 y.o. 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  Patient was also weaker on the left than her baseline. Head CT reprot chronic right cerebellar infarct and questionable acute to subacute right MCA infarct. Further work up ongoing   Clinical Impression  PT evaluation complete. Patient currently is alert and able to follow single step commands with increased time for motor planning. Patient is oriented x 3. Max A is required supine to short sitting from both sides of the bed. Extreme left lateral lean with upright sitting requiring Total A- Max A to maintain midline despite tactile, verbal cues, and facilitation. Patient reports she usually requires assistance to get OOB to wheelchair at home and per chart patient is able to ambulate short distance using walker in her room at baseline. Unable to attempt transfers today due to poor sitting balance. Consider SNF placement if family is unable to provide assistance with mobility at home. HHPT needed at minimum.      Follow Up Recommendations SNF (will require physical assistance if going home with family )    Equipment Recommendations  None recommended by PT    Recommendations for Other Services       Precautions / Restrictions Precautions Precautions: Fall Restrictions Weight Bearing Restrictions: No      Mobility  Bed Mobility Overal bed mobility: Needs Assistance Bed Mobility: Supine to Sit;Sit to Supine;Rolling Rolling: Mod assist;Max assist (Mod A for rolling to left and Max A for rolling to right )   Supine to sit: Max assist Sit to supine: Max assist   General bed  mobility comments: assistance for bilateral LE and trunk support. patient with poor trunk initiation and hard lean to left with mobility efforts. performed bed mobility from left and from right side of bed   Transfers                 General transfer comment: unable to safely attempt due to poor sitting balance with extreem left lateral lean   Ambulation/Gait             General Gait Details: unable to safely attempt at this time   Stairs            Wheelchair Mobility    Modified Rankin (Stroke Patients Only)       Balance Overall balance assessment: Needs assistance Sitting-balance support: Single extremity supported Sitting balance-Leahy Scale: Zero Sitting balance - Comments: patient required Total A to Max A to maintain midline sitting balance. patient with extreem left lateral lean in sitting position despite visual cues, tactile feedback and faciliation. When patient using RUE holding bed rail, patient was able to maintain midline sitting balance with brief periods of Min A.  Postural control: Left lateral lean                                   Pertinent Vitals/Pain Pain Assessment: No/denies pain    Home Living Family/patient expects to be discharged to:: Private residence Living Arrangements: Other relatives (patient reports daughter lives with her ) Available Help at Discharge: Family Type  of Home: House       Home Layout: Able to live on main level with bedroom/bathroom Home Equipment: Wheelchair - [employer] - 2 wheels Additional Comments: patient is a poor historian but provided above information. per chart patient was ambulating short distance with walker but primarily used wheelchair for moblity     Prior Function Level of Independence: Needs assistance   Gait / Transfers Assistance Needed: per chart, patient was able to ambulate short distance in room with walker. patient reports her family assisted her with transfers to  wheelchair and wheelchair mobility   ADL's / Homemaking Assistance Needed: patient reports her daughter assist her with dressing and bathing         Hand Dominance   Dominant Hand: Right    Extremity/Trunk Assessment   Upper Extremity Assessment Upper Extremity Assessment: Defer to OT evaluation;LUE deficits/detail LUE Deficits / Details: previous stroke with residual weakness. minimal active movement noted and PROM is limited by hypertonicity     Lower Extremity Assessment Lower Extremity Assessment: RLE deficits/detail;LLE deficits/detail RLE Deficits / Details: generalized weakness throughout. patient is able to perform SLR and activate ankle, knee movement. difficult to formally assess as patient unable to follow multi step commands for MMT  RLE Sensation:  (intact to light touch ) RLE Coordination: decreased gross motor LLE Deficits / Details: weakness noted throughout. patient with minimal ankle/foot movement. trace movement noted with hip flexion, hip add/abd, and knee extension. possible increased tone with PROM vs guarding  LLE Sensation:  (intact to light touch ) LLE Coordination: decreased gross motor       Communication   Communication: Other (comment);Expressive difficulties (previous stroke with left facial droop, slurred speech  )  Cognition Arousal/Alertness: Awake/alert Behavior During Therapy: WFL for tasks assessed/performed                                   General Comments: patient is oriented to self, place, situation. patient is able to follow single step commands with exta time. unclear if this is different from baseline as patient with previous stroke       General Comments      Exercises     Assessment/Plan    PT Assessment Patient needs continued PT services  PT Problem List Decreased strength;Decreased range of motion;Decreased balance;Decreased mobility;Decreased coordination;Decreased activity tolerance;Decreased knowledge of  precautions       PT Treatment Interventions DME instruction;Gait training;Functional mobility training;Balance training;Therapeutic exercise;Therapeutic activities;Neuromuscular re-education;Wheelchair mobility training;Patient/family education    PT Goals (Current goals can be found in the Care Plan section)  Acute Rehab PT Goals Patient Stated Goal: to go home with family  PT Goal Formulation: With patient Time For Goal Achievement: 03/16/20 Potential to Achieve Goals: Good    Frequency 7X/week   Barriers to discharge        Co-evaluation               AM-PAC PT "6 Clicks" Mobility  Outcome Measure Help needed turning from your back to your side while in a flat bed without using bedrails?: A Lot Help needed moving from lying on your back to sitting on the side of a flat bed without using bedrails?: A Lot Help needed moving to and from a bed to a chair (including a wheelchair)?: Total Help needed standing up from a chair using your arms (e.g., wheelchair or bedside chair)?: Total Help needed to walk in hospital  room?: Total Help needed climbing 3-5 steps with a railing? : Total 6 Click Score: 8    End of Session Equipment Utilized During Treatment: Gait belt Activity Tolerance: Patient limited by fatigue Patient left: in bed;with bed alarm set;with call bell/phone within reach (on bed pan per patient request ) Nurse Communication: Mobility status (alerted of leaking IV and patient on bed pan ) PT Visit Diagnosis: Hemiplegia and hemiparesis;Difficulty in walking, not elsewhere classified (R26.2) Hemiplegia - Right/Left: Left Hemiplegia - dominant/non-dominant: Non-dominant Hemiplegia - caused by: Cerebral infarction    Time: 0626-9485 PT Time Calculation (min) (ACUTE ONLY): 35 min   Charges:   PT Evaluation $PT Eval Moderate Complexity: 1 Mod PT Treatments $Therapeutic Activity: 8-22 mins        Donna Bernard, PT, MPT  Sarah Delacruz 03/02/2020, 10:56  AM

## 2020-03-02 NOTE — Progress Notes (Signed)
Subjective: Patient again with difficulty moving the LUE this morning.  Remains dysarthric.    Objective: Current vital signs: BP 135/64 (BP Location: Right Arm)   Pulse 80   Temp 97.7 F (36.5 C) (Oral)   Resp 16   Ht 5\' 6"  (1.676 m)   Wt 48.3 kg   SpO2 99%   BMI 17.19 kg/m  Vital signs in last 24 hours: Temp:  [97.7 F (36.5 C)-98.3 F (36.8 C)] 97.7 F (36.5 C) (07/06 0818) Pulse Rate:  [75-86] 80 (07/06 0818) Resp:  [16-22] 16 (07/06 0818) BP: (130-177)/(62-85) 135/64 (07/06 0818) SpO2:  [97 %-100 %] 99 % (07/06 0818)  Intake/Output from previous day: 07/05 0701 - 07/06 0700 In: 888.8 [I.V.:888.8] Out: 1600 [Urine:1600] Intake/Output this shift: No intake/output data recorded. Nutritional status:  Diet Order            DIET - DYS 1 Room service appropriate? Yes; Fluid consistency: Thin  Diet effective now                 Neurologic Exam: Mental Status: Alert, oriented, thought content appropriate.  Speech fluent without evidence of aphasia.  Dysarthric.  Able to follow commands. Cranial Nerves: II: Discs flat bilaterally; Visual fields grossly normal, pupils equal, round, reactive to light and accommodation III,IV, VI: ptosis not present, extra-ocular motions intact bilaterally V,VII: left facial droop, facial light touch sensation normal bilaterally VIII: hearing normal bilaterally IX,X: gag reflex present XI: bilateral shoulder shrug XII: midline tongue extension Motor: Right :  Upper extremity   5/5                                      Left:     Upper extremity   2/5             Lower extremity   3+/5                                                Lower extremity   2/5 Increased tone on the left Sensory: Pinprick and light touch intact throughout, bilaterally   Lab Results: Basic Metabolic Panel: Recent Labs  Lab 03/01/20 1239 03/01/20 1239 03/01/20 1914 03/02/20 0219 03/02/20 1033  NA 126*  --  123* 127* 131*  K 5.4*  --  4.5 4.2 3.7  CL  91*  --  88* 92* 95*  CO2 24  --  24 26 25   GLUCOSE 104*  --  104* 87 130*  BUN 13  --  12 11 11   CREATININE 0.88  --  0.82 0.77 0.90  CALCIUM 9.0   < > 8.8* 8.8* 9.0   < > = values in this interval not displayed.    Liver Function Tests: Recent Labs  Lab 03/01/20 1239  AST 19  ALT 10  ALKPHOS 55  BILITOT 0.7  PROT 7.3  ALBUMIN 3.2*   No results for input(s): LIPASE, AMYLASE in the last 168 hours. No results for input(s): AMMONIA in the last 168 hours.  CBC: Recent Labs  Lab 03/01/20 0938  WBC 8.1  NEUTROABS 4.4  HGB 11.8*  HCT 35.3*  MCV 86.7  PLT 466*    Cardiac Enzymes: No results for input(s): CKTOTAL, CKMB, CKMBINDEX, TROPONINI in the last 168 hours.  Lipid Panel: Recent Labs  Lab 03/02/20 0219  CHOL 133  TRIG 51  HDL 51  CHOLHDL 2.6  VLDL 10  LDLCALC 72    CBG: No results for input(s): GLUCAP in the last 168 hours.  Microbiology: Results for orders placed or performed during the hospital encounter of 03/01/20  SARS Coronavirus 2 by RT PCR (hospital order, performed in [facility] hospital lab) Nasopharyngeal Nasopharyngeal Swab     Status: None   Collection Time: 03/01/20 11:04 AM   Specimen: Nasopharyngeal Swab  Result Value Ref Range Status   SARS Coronavirus 2 NEGATIVE NEGATIVE Final    Comment: (NOTE) SARS-CoV-2 target nucleic acids are NOT DETECTED.  The SARS-CoV-2 RNA is generally detectable in upper and lower respiratory specimens during the acute phase of infection. The lowest concentration of SARS-CoV-2 viral copies this assay can detect is 250 copies / mL. A negative result does not preclude SARS-CoV-2 infection and should not be used as the sole basis for treatment or other patient management decisions.  A negative result may occur with improper specimen collection / handling, submission of specimen other than nasopharyngeal swab, presence of viral mutation(s) within the areas targeted by this assay, and inadequate number of  viral copies (<250 copies / mL). A negative result must be combined with clinical observations, patient history, and epidemiological information.  Fact Sheet for Patients:   BoilerBrush.com.cy  Fact Sheet for Healthcare Providers: https://pope.com/  This test is not yet approved or  cleared by the [place] FDA and has been authorized for detection and/or diagnosis of SARS-CoV-2 by FDA under an Emergency Use Authorization (EUA).  This EUA will remain in effect (meaning this test can be used) for the duration of the COVID-19 declaration under Section 564(b)(1) of the Act, 21 U.S.C. section 360bbb-3(b)(1), unless the authorization is terminated or revoked sooner.  Performed at [facility], [address] [place], [place] [address]     Coagulation Studies: Recent Labs    03/01/20 1819  LABPROT 13.3  INR 1.1    Imaging: CT Head Wo Contrast  Addendum Date: 03/01/2020   ADDENDUM REPORT: 03/01/2020 11:14 ADDENDUM: Study discussed by telephone with Dr. Dorothea Glassman on 03/01/2020 at 1058 hours. Electronically Signed   By: Odessa Fleming M.D.   On: 03/01/2020 11:14   Result Date: 03/01/2020 CLINICAL DATA:  86 year old female with increased left side weakness and slurred speech onset this morning. Code stroke presentation in March with right MCA M2 occlusion at that time. EXAM: CT HEAD WITHOUT CONTRAST TECHNIQUE: Contiguous axial images were obtained from the base of the skull through the vertex without intravenous contrast. COMPARISON:  Head CT and CTA CTP 10/29/2019. FINDINGS: Brain: Developing encephalomalacia in the right MCA territory which appears to partially correspond to the areas of early cytotoxic edema on 10/29/2019. There is new involvement of the right inferior frontal gyrus, but this appears to have similar chronic density. However, there is more subacute appearing involvement of the right middle and superior frontal gyrus on  series 2, image 20. No associated hemorrhage or mass effect. Furthermore there is also a new but chronic appearing small area of cortical encephalomalacia in the left superior perirolandic cortex on series 2, image 21. No intracranial mass effect. Mild ex vacuo lateral ventricular enlargement since March. Posterior fossa gray-white matter differentiation appears stable including small chronic right cerebellar infarct. ASPECTS 9 (abnormal right M5 segment superimposed on suspected chronic ischemia elsewhere in the right MCA territory). Vascular: Calcified atherosclerosis at the skull  base. No suspicious intracranial vascular hyperdensity. Skull: No acute osseous abnormality identified. Sinuses/Orbits: Visualized paranasal sinuses and mastoids are stable and well pneumatized. Other: Mild leftward gaze deviation, otherwise negative orbits and scalp. IMPRESSION: 1. Suspect acute or subacute on chronic right MCA territory ischemia. The most recent appearing cytotoxic edema is in the right superior/middle frontal gyrus (ASPECTS 9). No hemorrhage or mass effect. 2. Developing encephalomalacia elsewhere in the right frontal lobe thought related to evolution of the March infarct. 3. Small chronic right cerebellar infarct. Electronically Signed: By: Odessa FlemingH  Hall M.D. On: 03/01/2020 10:53   MR BRAIN WO CONTRAST  Result Date: 03/01/2020 CLINICAL DATA:  Stroke suspected. Additional provided: Worsening left-sided weakness, slurred speech. EXAM: MRI HEAD WITHOUT CONTRAST TECHNIQUE: Multiplanar, multiecho pulse sequences of the brain and surrounding structures were obtained without intravenous contrast. COMPARISON:  Head CT 03/01/2020, noncontrast head CT, CT angiogram head/neck and CT perfusion 10/29/2019 FINDINGS: Brain: Mild intermittent motion degradation. 6 mm focus of restricted diffusion within the posterior limb of right internal capsule may reflect wallerian degeneration or a small acute/early subacute infarct (series 5,  image 25). There is also an 11 mm cortically based focus of diffusion weighted hyperintensity within the right temporoparietal junction which does not definitively demonstrate T2 shine through on the ADC map and may reflect a small focus of subacute ischemia (series 5, image 25). No evidence of acute or recent subacute infarct elsewhere within the brain. Fairly extensive multifocal chronic infarction changes within the right MCA vascular territory have progressed as compared to prior head CT 10/29/2019. Portions of the frontal, parietal, occipital and temporal lobes are affected. Redemonstrated small chronic cortical infarct within the left parietal lobe (series 15, images 30 5-30). Stable background moderate to advanced chronic small vessel ischemic disease within the cerebral white matter and pons. Redemonstrated small chronic infarct within the right cerebellum. Stable, moderate generalized parenchymal atrophy. Chronic hemosiderin deposition associated with some of the chronic right MCA territory infarcts. Redemonstrated mineralization within the basal ganglia and dentate nuclei. No evidence of intracranial mass. No extra-axial fluid collection. No midline shift. Vascular: Expected proximal arterial flow voids. Skull and upper cervical spine: No focal marrow lesion. Sinuses/Orbits: Visualized orbits show no acute finding. Mild ethmoid sinus mucosal thickening. Right mastoid effusion. Other: 13 mm T2 hyperintense lesion along the medial aspect of the left parotid gland suspicious for primary parotid neoplasm (series 10, image 2). IMPRESSION: 1. 6 mm focus of restricted diffusion within the posterior limb of right internal capsule. This may reflect wallerian degeneration or a small acute/early subacute infarct. 2. 11 mm cortically based focus of diffusion weighted hyperintensity within the right temporoparietal junction suspicious for subacute ischemia. 3. Extensive chronic infarction changes with the right MCA  vascular territory, progressed as compared to prior head CT 10/29/2019. 4. Redemonstrated small chronic cortically based infarct within the left parietal lobe. 5. Stable background moderate to advanced chronic small vessel ischemic disease within the cerebral white matter and pons. 6. Redemonstrated chronic infarct within the right cerebellum. 7. Stable moderate generalized parenchymal atrophy. 8. 13 mm T2 hyperintense lesion within the medial aspect of the left parotid gland suspicious for primary parotid neoplasm. A nonemergent contrast-enhanced neck CT is recommended for further evaluation. 9. Mild ethmoid sinus mucosal thickening. 10. Right mastoid effusion. Electronically Signed   By: Jackey LogeKyle  Golden DO   On: 03/01/2020 21:32    Medications:  I have reviewed the patient's current medications. Scheduled: . aspirin EC  81 mg Oral Daily  . calcium-vitamin D  1 tablet Oral Q breakfast  . clopidogrel  75 mg Oral Daily  . enoxaparin (LOVENOX) injection  40 mg Subcutaneous Q24H  . hydrALAZINE  10 mg Oral BID  . loratadine  10 mg Oral Daily  . metoprolol succinate  50 mg Oral Daily  . pantoprazole  40 mg Oral Daily  . pravastatin  40 mg Oral QPC supper  . vitamin B-12  1,000 mcg Oral Daily    Assessment/Plan: 86 y.o. 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  Patient was also weaker on the left than her baseline.  With no improvement EMS was called.  On arrival patient was improving.  At my evaluation patient was at baseline per family.  At baseline patient mostly wheelchair bound.  On ASA and statin.  Head CT personally reviewed and shows chronic right cerebellar infarct and right frontal lobe encephalomalacia with questionable acute to subacute right MCA infarct but no films from her hospitalization in  March for comparison.  Full work up in March was unremarkable.   MRI of the brain personally reviewed and shows small acute infarcts in the right IC and temporoparietal junction.  Embolic etiology likely.  Last CTA of the neck in March shows no hemodynamically significant stenosis.  Patient with atrial fibrillation on EKG.   LDL 72, A1c 6.1.  Stroke Risk Factors - atrial fibrillation, hyperlipidemia and hypertension  Plan: 1. PT consult, OT consult, Speech consult 2. Echocardiogram pending 3. With atrial fibrillation on EKG patient a candidate for Eliquis.  Would discuss with family how aggressive they would like to be taking into account stroke risk.   4. NPO until RN stroke swallow screen 5. Frequent neuro checks    LOS: 0 days   Thana Farr, MD Neurology [phone] 03/02/2020  12:08 PM

## 2020-03-02 NOTE — Progress Notes (Signed)
PROGRESS NOTE    Sarah MeadMildred Curb   XBJ:[id number]  DOB: 09-05-1923  PCP: Barbette ReichmannHande, Vishwanath, MD    DOA: 03/01/2020 LOS: 0   Brief Narrative   Sarah Delacruz is a 86 y.o. female with medical history of hypertension, hyperlipidemia, stroke in March with residual left-sided weakness and slurred speech, GERD, depression, CKD stage III, dCHF, left bundle blockade, anemia, who presented to the ED on 03/01/20 with worsening left-sided weakness, slurred speech. Last known normal at her recent baseline at abut 10:00 PM last night, then noted to have worse left arm weakness and more slurred speech, and possible worsened left facial droop the next morning.  In the ED, BP 189/70, tachycardic other vitals normal.  Labs were notable for BNP 298, sodium 126, K 5.4, no leukocytosis.  EKG showed new onset atrial fibrillation.  CT head showed suspected acute or subacute on chronic right MCA territory ischemia in addition to sequelae of her prior stroke.  Admitted to hospitalist service with neurology and cardiology consulted.     Assessment & Plan   Principal Problem:   Stroke [facility](HCC) Active Problems:   Chronic diastolic congestive heart failure (HCC)   Essential hypertension   HLD (hyperlipidemia)   Atrial fibrillation (HCC)   CKD (chronic kidney disease), stage IIIa   Depression   Hyponatremia   Hyperkalemia   Goals of care, counseling/discussion   Palliative care by specialist   DNR (do not resuscitate) discussion   Acute embolic stroke - POA. CT of head showed possible acute or subacute on chronic right MCA territory  Ischemia.  Patient had full stroke evaluation after the CVA in March, it was unremarkable.  MRI brain this admission shows small acute infarcts in the right IC and temporalparietal junction.  In the setting of newly discovered a-fib, likely embolic etiology.  Prior CTA's without any significant vessel stenosis.  Neurology is following.    --evaluated by PT (no follow up), OT  (recommend SNF) and SLP (no follow up) --Echo pending --Continue ASA and Plavix --Given a-fib and embolic strokes, anticoagulation is indicated, need to discuss with family if this in line with their overall goals of care.    New onset atrial fibrillation - CHA2DS2-VASc Score is 7, needs oral anticoagulation. [facility].  Normal TSH.  Rate controlled.  Continue metoprolol.  Discuss risks/benefits of anticoagulation with family.  Hyperkalemia - POA, potassium 5.4.  Given kayexalate on admission.  Resolved.  Monitor BMP daily.  Hyponatremia - POA with Na 126, low serum osm, elevated urine osm and urine Na.  Euvolemic vs hypovolemic hypotonic.  Suspect SIADH given her recent strokes, but sodium has improved with IV hydration (131 this afternoon).  Stop IV fluids to avoid rapidly overcorrecting (risk of OSD/CPM). TSH normal.  Check AM cortisol and uric acid.  Continue serial BMP's to monitor. Avoid rapid correction.  Abnormal Urinalysis - POA, UA turbid, large amount of leukocyte, many bacteria, WBC >50, 11-20 epi cells (?contaminated).  Urine culture is pending, but patient is asymptomatic.  Urine sample is contaminated with squamous cells.  Defer antibiotics and monitor for signs of infection.    Chronic diastolic CHF- last echo in March, EF 60-65%.  Patient appears euvolemic, well compensated.    Essential hypertension - Continue home hydralazine and metoprolol, hydralazine PRN.  Hyperlipidemia - continue Pravastatin  Hx of CKD stage IIIa - normal creatinine, monitor BMP.  Depression - appears not on home medications   Patient BMI: Body mass index is 17.19 kg/m.  DVT prophylaxis: enoxaparin (LOVENOX) injection 40 mg Start: 03/01/20 2200   Diet:  Diet Orders (From admission, onward)    Start     Ordered   03/02/20 0857  DIET - DYS 1 Room service appropriate? Yes; Fluid consistency: Thin  Diet effective now       Comments: Send gravy on all meats  Question  Answer Comment  Room service appropriate? Yes   Fluid consistency: Thin      03/02/20 0857            Code Status: Full Code    Subjective 03/02/20    Patient seen at bedside, no acute events reported.  She reports feeling good.  Denies any pain, fevers, headache, N/V/D, CP, SOB or other acute complaints  Disposition Plan & Communication   Status is: Inpatient  Remains inpatient appropriate because:Inpatient level of care appropriate due to severity of illness   Dispo: The patient is from: Home              Anticipated d/c is to: Home              Anticipated d/c date is: 1 day              Patient currently is not medically stable to d/c.    Family Communication: none at bedside, will attempt to call   Consults, Procedures, Significant Events   Consultants:   Neurology  [facility]  Procedures:   None  Antimicrobials:   None     Objective   Vitals:   03/01/20 1937 03/02/20 0028 03/02/20 0818 03/02/20 1218  BP: (!) 160/62 130/62 135/64 (!) 137/57  Pulse: 81 75 80 72  Resp: 16 17 16 16   Temp: 98.3 F (36.8 C) 98 F (36.7 C) 97.7 F (36.5 C) 97.8 F (36.6 C)  TempSrc: Oral  Oral   SpO2: 98% 100% 99% 99%  Weight:      Height:        Intake/Output Summary (Last 24 hours) at 03/02/2020 1705 Last data filed at 03/02/2020 1445 Gross per 24 hour  Intake 888.75 ml  Output 2100 ml  Net -1211.25 ml   Filed Weights   03/01/20 0957  Weight: 48.3 kg    Physical Exam:  General exam: awake, alert, no acute distress, frail HEENT: moist mucus membranes, hearing grossly normal, left facial droop Respiratory system: CTAB, normal respiratory effort. Cardiovascular system: irregular rhythm, regular rate, 2/6 systolic murmur, no pedal edema.   Gastrointestinal system: soft, NT, ND, no HSM felt, +bowel sounds. Central nervous system: A&O x3. slurred speech, LUE weakness Extremities: moves all, no cyanosis, normal tone Skin: dry, intact, normal  temperature Psychiatry: normal mood, congruent affect  Labs   Data Reviewed: I have personally reviewed following labs and imaging studies  CBC: Recent Labs  Lab 03/01/20 0938  WBC 8.1  NEUTROABS 4.4  HGB 11.8*  HCT 35.3*  MCV 86.7  PLT 466*   Basic Metabolic Panel: Recent Labs  Lab 03/01/20 1239 03/01/20 1914 03/02/20 0219 03/02/20 1033  NA 126* 123* 127* 131*  K 5.4* 4.5 4.2 3.7  CL 91* 88* 92* 95*  CO2 24 24 26 25   GLUCOSE 104* 104* 87 130*  BUN 13 12 11 11   CREATININE 0.88 0.82 0.77 0.90  CALCIUM 9.0 8.8* 8.8* 9.0   GFR: Estimated Creatinine Clearance: 29.8 mL/min (by C-G formula based on SCr of 0.9 mg/dL). Liver Function Tests: Recent Labs  Lab 03/01/20 1239  AST 19  ALT 10  ALKPHOS 55  BILITOT 0.7  PROT 7.3  ALBUMIN 3.2*   No results for input(s): LIPASE, AMYLASE in the last 168 hours. No results for input(s): AMMONIA in the last 168 hours. Coagulation Profile: Recent Labs  Lab 03/01/20 1819  INR 1.1   Cardiac Enzymes: No results for input(s): CKTOTAL, CKMB, CKMBINDEX, TROPONINI in the last 168 hours. BNP (last 3 results) No results for input(s): PROBNP in the last 8760 hours. HbA1C: Recent Labs    03/02/20 0219  HGBA1C 6.1*   CBG: No results for input(s): GLUCAP in the last 168 hours. Lipid Profile: Recent Labs    03/02/20 0219  CHOL 133  HDL 51  LDLCALC 72  TRIG 51  CHOLHDL 2.6   Thyroid Function Tests: Recent Labs    03/02/20 0219  TSH 2.728   Anemia Panel: No results for input(s): VITAMINB12, FOLATE, FERRITIN, TIBC, IRON, RETICCTPCT in the last 72 hours. Sepsis Labs: No results for input(s): PROCALCITON, LATICACIDVEN in the last 168 hours.  Recent Results (from the past 240 hour(s))  SARS Coronavirus 2 by RT PCR (hospital order, performed in [facility] hospital lab) Nasopharyngeal Nasopharyngeal Swab     Status: None   Collection Time: 03/01/20 11:04 AM   Specimen: Nasopharyngeal Swab  Result Value Ref Range Status     SARS Coronavirus 2 NEGATIVE NEGATIVE Final    Comment: (NOTE) SARS-CoV-2 target nucleic acids are NOT DETECTED.  The SARS-CoV-2 RNA is generally detectable in upper and lower respiratory specimens during the acute phase of infection. The lowest concentration of SARS-CoV-2 viral copies this assay can detect is 250 copies / mL. A negative result does not preclude SARS-CoV-2 infection and should not be used as the sole basis for treatment or other patient management decisions.  A negative result may occur with improper specimen collection / handling, submission of specimen other than nasopharyngeal swab, presence of viral mutation(s) within the areas targeted by this assay, and inadequate number of viral copies (<250 copies / mL). A negative result must be combined with clinical observations, patient history, and epidemiological information.  Fact Sheet for Patients:   BoilerBrush.com.cy  Fact Sheet for Healthcare Providers: https://pope.com/  This test is not yet approved or  cleared by the [place] FDA and has been authorized for detection and/or diagnosis of SARS-CoV-2 by FDA under an Emergency Use Authorization (EUA).  This EUA will remain in effect (meaning this test can be used) for the duration of the COVID-19 declaration under Section 564(b)(1) of the Act, 21 U.S.C. section 360bbb-3(b)(1), unless the authorization is terminated or revoked sooner.  Performed at [facility], [address], [place], [place] [address]       Imaging Studies   CT Head Wo Contrast  Addendum Date: 03/01/2020   ADDENDUM REPORT: 03/01/2020 11:14 ADDENDUM: Study discussed by telephone with Dr. Dorothea Glassman on 03/01/2020 at 1058 hours. Electronically Signed   By: Odessa Fleming M.D.   On: 03/01/2020 11:14   Result Date: 03/01/2020 CLINICAL DATA:  86 year old female with increased left side weakness and slurred speech onset this morning. Code  stroke presentation in March with right MCA M2 occlusion at that time. EXAM: CT HEAD WITHOUT CONTRAST TECHNIQUE: Contiguous axial images were obtained from the base of the skull through the vertex without intravenous contrast. COMPARISON:  Head CT and CTA CTP 10/29/2019. FINDINGS: Brain: Developing encephalomalacia in the right MCA territory which appears to partially correspond to the areas of early cytotoxic edema on 10/29/2019. There  is new involvement of the right inferior frontal gyrus, but this appears to have similar chronic density. However, there is more subacute appearing involvement of the right middle and superior frontal gyrus on series 2, image 20. No associated hemorrhage or mass effect. Furthermore there is also a new but chronic appearing small area of cortical encephalomalacia in the left superior perirolandic cortex on series 2, image 21. No intracranial mass effect. Mild ex vacuo lateral ventricular enlargement since March. Posterior fossa gray-white matter differentiation appears stable including small chronic right cerebellar infarct. ASPECTS 9 (abnormal right M5 segment superimposed on suspected chronic ischemia elsewhere in the right MCA territory). Vascular: Calcified atherosclerosis at the skull base. No suspicious intracranial vascular hyperdensity. Skull: No acute osseous abnormality identified. Sinuses/Orbits: Visualized paranasal sinuses and mastoids are stable and well pneumatized. Other: Mild leftward gaze deviation, otherwise negative orbits and scalp. IMPRESSION: 1. Suspect acute or subacute on chronic right MCA territory ischemia. The most recent appearing cytotoxic edema is in the right superior/middle frontal gyrus (ASPECTS 9). No hemorrhage or mass effect. 2. Developing encephalomalacia elsewhere in the right frontal lobe thought related to evolution of the March infarct. 3. Small chronic right cerebellar infarct. Electronically Signed: By: Odessa Fleming M.D. On: 03/01/2020 10:53    MR BRAIN WO CONTRAST  Result Date: 03/01/2020 CLINICAL DATA:  Stroke suspected. Additional provided: Worsening left-sided weakness, slurred speech. EXAM: MRI HEAD WITHOUT CONTRAST TECHNIQUE: Multiplanar, multiecho pulse sequences of the brain and surrounding structures were obtained without intravenous contrast. COMPARISON:  Head CT 03/01/2020, noncontrast head CT, CT angiogram head/neck and CT perfusion 10/29/2019 FINDINGS: Brain: Mild intermittent motion degradation. 6 mm focus of restricted diffusion within the posterior limb of right internal capsule may reflect wallerian degeneration or a small acute/early subacute infarct (series 5, image 25). There is also an 11 mm cortically based focus of diffusion weighted hyperintensity within the right temporoparietal junction which does not definitively demonstrate T2 shine through on the ADC map and may reflect a small focus of subacute ischemia (series 5, image 25). No evidence of acute or recent subacute infarct elsewhere within the brain. Fairly extensive multifocal chronic infarction changes within the right MCA vascular territory have progressed as compared to prior head CT 10/29/2019. Portions of the frontal, parietal, occipital and temporal lobes are affected. Redemonstrated small chronic cortical infarct within the left parietal lobe (series 15, images 30 5-30). Stable background moderate to advanced chronic small vessel ischemic disease within the cerebral white matter and pons. Redemonstrated small chronic infarct within the right cerebellum. Stable, moderate generalized parenchymal atrophy. Chronic hemosiderin deposition associated with some of the chronic right MCA territory infarcts. Redemonstrated mineralization within the basal ganglia and dentate nuclei. No evidence of intracranial mass. No extra-axial fluid collection. No midline shift. Vascular: Expected proximal arterial flow voids. Skull and upper cervical spine: No focal marrow lesion.  Sinuses/Orbits: Visualized orbits show no acute finding. Mild ethmoid sinus mucosal thickening. Right mastoid effusion. Other: 13 mm T2 hyperintense lesion along the medial aspect of the left parotid gland suspicious for primary parotid neoplasm (series 10, image 2). IMPRESSION: 1. 6 mm focus of restricted diffusion within the posterior limb of right internal capsule. This may reflect wallerian degeneration or a small acute/early subacute infarct. 2. 11 mm cortically based focus of diffusion weighted hyperintensity within the right temporoparietal junction suspicious for subacute ischemia. 3. Extensive chronic infarction changes with the right MCA vascular territory, progressed as compared to prior head CT 10/29/2019. 4. Redemonstrated small chronic cortically based infarct  within the left parietal lobe. 5. Stable background moderate to advanced chronic small vessel ischemic disease within the cerebral white matter and pons. 6. Redemonstrated chronic infarct within the right cerebellum. 7. Stable moderate generalized parenchymal atrophy. 8. 13 mm T2 hyperintense lesion within the medial aspect of the left parotid gland suspicious for primary parotid neoplasm. A nonemergent contrast-enhanced neck CT is recommended for further evaluation. 9. Mild ethmoid sinus mucosal thickening. 10. Right mastoid effusion. Electronically Signed   By: Jackey Loge DO   On: 03/01/2020 21:32     Medications   Scheduled Meds: . aspirin EC  81 mg Oral Daily  . calcium-vitamin D  1 tablet Oral Q breakfast  . clopidogrel  75 mg Oral Daily  . enoxaparin (LOVENOX) injection  40 mg Subcutaneous Q24H  . hydrALAZINE  10 mg Oral BID  . loratadine  10 mg Oral Daily  . metoprolol succinate  50 mg Oral Daily  . pantoprazole  40 mg Oral Daily  . pravastatin  40 mg Oral QPC supper  . vitamin B-12  1,000 mcg Oral Daily   Continuous Infusions: . sodium chloride 75 mL/hr at 03/02/20 0400       LOS: 0 days    Time spent: 35  minutes    Pennie Banter, DO [facility]  03/02/2020, 5:05 PM    If 7PM-7AM, please contact night-coverage. How to contact the [facility] Attending or Consulting provider 7A - 7P or covering provider during after hours 7P -7A, for this patient?    1. Check the care team in [facility] and look for a) attending/consulting [facility] provider listed and b) the [facility] team listed 2. Log into [email or web address] and use [place]'s universal password to access. If you do not have the password, please contact the hospital operator. 3. Locate the [facility] provider you are looking for under [facility] and page to a number that you can be directly reached. 4. If you still have difficulty reaching the provider, please page the [facility] (Director on Call) for the Hospitalists listed on amion for assistance.

## 2020-03-03 LAB — BASIC METABOLIC PANEL
Anion gap: 12 (ref 5–15)
BUN: 12 mg/dL (ref 8–23)
CO2: 22 mmol/L (ref 22–32)
Calcium: 8.5 mg/dL — ABNORMAL LOW (ref 8.9–10.3)
Chloride: 102 mmol/L (ref 98–111)
Creatinine, Ser: 0.81 mg/dL (ref 0.44–1.00)
GFR calc Af Amer: >60 mL/min (ref >60)
GFR calc non Af Amer: >60 mL/min (ref >60)
Glucose, Bld: 82 mg/dL (ref 70–99)
Potassium: 3.2 mmol/L — ABNORMAL LOW (ref 3.5–5.1)
Sodium: 136 mmol/L (ref 135–145)

## 2020-03-03 LAB — MAGNESIUM: Magnesium: 1.6 mg/dL — ABNORMAL LOW (ref 1.7–2.4)

## 2020-03-03 LAB — ECHOCARDIOGRAM COMPLETE
Height: 66 in
Weight: 1703.71 oz

## 2020-03-03 MED ORDER — MAGNESIUM SULFATE 4 GM/100ML IV SOLN
4.0000 g | Freq: Once | INTRAVENOUS | Status: AC
  Administered 2020-03-03: 09:00:00 4 g via INTRAVENOUS
  Filled 2020-03-03: qty 100

## 2020-03-03 MED ORDER — POTASSIUM CHLORIDE 20 MEQ PO PACK
40.0000 meq | PACK | ORAL | Status: AC
  Administered 2020-03-03 (×2): 40 meq via ORAL
  Filled 2020-03-03 (×2): qty 2

## 2020-03-03 NOTE — Progress Notes (Signed)
PROGRESS NOTE    Sarah Delacruz   ZOX:[id number]  DOB: 1923-10-30  PCP: Barbette ReichmannHande, Vishwanath, MD    DOA: 03/01/2020 LOS: 1   Brief Narrative   Sarah Delacruz is a 86 y.o. female with medical history of hypertension, hyperlipidemia, stroke in March with residual left-sided weakness and slurred speech, GERD, depression, CKD stage III, dCHF, left bundle blockade, anemia, who presented to the ED on 03/01/20 with worsening left-sided weakness, slurred speech. Last known normal at her recent baseline at abut 10:00 PM last night, then noted to have worse left arm weakness and more slurred speech, and possible worsened left facial droop the next morning.  In the ED, BP 189/70, tachycardic other vitals normal.  Labs were notable for BNP 298, sodium 126, K 5.4, no leukocytosis.  EKG showed new onset atrial fibrillation.  CT head showed suspected acute or subacute on chronic right MCA territory ischemia in addition to sequelae of her prior stroke.  Admitted to hospitalist service with neurology and cardiology consulted.     Assessment & Plan   Principal Problem:   Stroke [facility](HCC) Active Problems:   Chronic diastolic congestive heart failure (HCC)   Essential hypertension   HLD (hyperlipidemia)   Atrial fibrillation (HCC)   CKD (chronic kidney disease), stage IIIa   Depression   Hyponatremia   Hyperkalemia   Goals of care, counseling/discussion   Palliative care by specialist   DNR (do not resuscitate) discussion   Acute embolic stroke (HCC)   Acute embolic stroke - POA. CT of head showed possible acute or subacute on chronic right MCA territory  Ischemia.  Patient had full stroke evaluation after the CVA in March, it was unremarkable.  MRI brain this admission shows small acute infarcts in the right IC and temporalparietal junction.  In the setting of newly discovered a-fib, likely embolic etiology.  Prior CTA's without any significant vessel stenosis.  Neurology is following.    --evaluated by  PT (no follow up), OT (recommend SNF) and SLP (no follow up) --Echo EF 60-65%, grade 1 diastolic dysfunction, severe pulmonary artery HTN --Continue ASA and Plavix --Given a-fib and embolic strokes, anticoagulation is indicated.  Family would like to discuss with her primary cardiologist.  New onset atrial fibrillation - CHA2DS2-VASc Score is 7, needs oral anticoagulation. [facility].  Normal TSH.  Rate controlled.  Continue metoprolol.  Discuss risks/benefits of anticoagulation with family.  Hyperkalemia - POA, potassium 5.4.  Given kayexalate on admission.  Resolved.  Monitor BMP daily.  Hyponatremia - POA with Na 126, low serum osm, elevated urine osm and urine Na.  Euvolemic vs hypovolemic hypotonic.  Suspect SIADH given her recent strokes, but sodium has improved with IV hydration (131 this afternoon).  Stop IV fluids to avoid rapidly overcorrecting (risk of OSD/CPM). TSH normal.  Check AM cortisol and uric acid.  Continue serial BMP's to monitor. Avoid rapid correction.  Abnormal Urinalysis - POA, UA turbid, large amount of leukocyte, many bacteria, WBC >50, 11-20 epi cells (?contaminated).  Urine culture is pending, but patient is asymptomatic.  Urine sample is contaminated with squamous cells.  Defer antibiotics and monitor for signs of infection.    Chronic diastolic CHF- last echo in March, EF 60-65%.  Patient appears euvolemic, well compensated.    Essential hypertension - Continue home hydralazine and metoprolol, hydralazine PRN.  Hyperlipidemia - continue Pravastatin  Hx of CKD stage IIIa - normal creatinine, monitor BMP.  Depression - appears not on home medications   Patient BMI:  Body mass index is 17.19 kg/m.   DVT prophylaxis: enoxaparin (LOVENOX) injection 40 mg Start: 03/01/20 2200   Diet:  Diet Orders (From admission, onward)    Start     Ordered   03/02/20 0857  DIET - DYS 1 Room service appropriate? Yes; Fluid consistency: Thin  Diet  effective now       Comments: Send gravy on all meats  Question Answer Comment  Room service appropriate? Yes   Fluid consistency: Thin      03/02/20 0857            Code Status: DNR    Subjective 03/03/20    Patient seen at bedside, no acute events reported.  She is upset because her finger is bleeding from CBG stick.  Denies any other acute complaints including fever/chills, chest pain, SOB, N/V/D.    Disposition Plan & Communication   Status is: Inpatient  Remains inpatient appropriate because:Inpatient level of care appropriate due to severity of illness   Dispo: The patient is from: Home              Anticipated d/c is to: Home              Anticipated d/c date is: 1 day              Patient currently is not medically stable to d/c.    Family Communication: none at bedside, will attempt to call   Consults, Procedures, Significant Events   Consultants:   Neurology  [facility]  Procedures:   None  Antimicrobials:   None     Objective   Vitals:   03/03/20 0453 03/03/20 0803 03/03/20 1148 03/03/20 1508  BP: (!) 134/56 115/84 (!) 165/58 (!) 165/58  Pulse: 78 82 71 67  Resp: 14 14 15 20   Temp: 97.6 F (36.4 C) 98.2 F (36.8 C) 97.8 F (36.6 C) 97.9 F (36.6 C)  TempSrc: Oral  Oral Oral  SpO2: 93% 99% 100% 100%  Weight:      Height:        Intake/Output Summary (Last 24 hours) at 03/03/2020 1816 Last data filed at 03/03/2020 1331 Gross per 24 hour  Intake 2125 ml  Output 1100 ml  Net 1025 ml   Filed Weights   03/01/20 0957  Weight: 48.3 kg    Physical Exam:  General exam: awake, alert, no acute distress, frail HEENT: moist mucus membranes, hearing grossly normal, left facial droop Respiratory system: CTAB, normal respiratory effort. Cardiovascular system: irregular rhythm, regular rate, 2/6 systolic murmur, no pedal edema.   Gastrointestinal system: soft, NT, ND, no HSM felt, +bowel sounds. Central nervous system: A&O x3. slurred  speech, LUE weakness seems stable Extremities: moves all, no cyanosis, normal tone Skin: dry, intact, normal temperature Psychiatry: normal mood, congruent affect  Labs   Data Reviewed: I have personally reviewed following labs and imaging studies  CBC: Recent Labs  Lab 03/01/20 0938  WBC 8.1  NEUTROABS 4.4  HGB 11.8*  HCT 35.3*  MCV 86.7  PLT 466*   Basic Metabolic Panel: Recent Labs  Lab 03/01/20 1914 03/02/20 0219 03/02/20 1033 03/02/20 1823 03/03/20 0546  NA 123* 127* 131* 132* 136  K 4.5 4.2 3.7 3.3* 3.2*  CL 88* 92* 95* 98 102  CO2 24 26 25 24 22   GLUCOSE 104* 87 130* 126* 82  BUN 12 11 11 13 12   CREATININE 0.82 0.77 0.90 0.88 0.81  CALCIUM 8.8* 8.8* 9.0 8.5* 8.5*  MG  --   --   --   --  1.6*   GFR: Estimated Creatinine Clearance: 33.1 mL/min (by C-G formula based on SCr of 0.81 mg/dL). Liver Function Tests: Recent Labs  Lab 03/01/20 1239  AST 19  ALT 10  ALKPHOS 55  BILITOT 0.7  PROT 7.3  ALBUMIN 3.2*   No results for input(s): LIPASE, AMYLASE in the last 168 hours. No results for input(s): AMMONIA in the last 168 hours. Coagulation Profile: Recent Labs  Lab 03/01/20 1819  INR 1.1   Cardiac Enzymes: No results for input(s): CKTOTAL, CKMB, CKMBINDEX, TROPONINI in the last 168 hours. BNP (last 3 results) No results for input(s): PROBNP in the last 8760 hours. HbA1C: Recent Labs    03/02/20 0219  HGBA1C 6.1*   CBG: No results for input(s): GLUCAP in the last 168 hours. Lipid Profile: Recent Labs    03/02/20 0219  CHOL 133  HDL 51  LDLCALC 72  TRIG 51  CHOLHDL 2.6   Thyroid Function Tests: Recent Labs    03/02/20 0219  TSH 2.728   Anemia Panel: No results for input(s): VITAMINB12, FOLATE, FERRITIN, TIBC, IRON, RETICCTPCT in the last 72 hours. Sepsis Labs: No results for input(s): PROCALCITON, LATICACIDVEN in the last 168 hours.  Recent Results (from the past 240 hour(s))  Urine Culture     Status: Abnormal (Preliminary  result)   Collection Time: 03/01/20 10:20 AM   Specimen: Urine, Random  Result Value Ref Range Status   Specimen Description   Final    URINE, RANDOM Performed at [facility], [address], [place], [place] [address]    Special Requests   Final    NONE Performed at [facility], [address], [place], [place] [address]    Culture (A)  Final    >=100,000 COLONIES/mL ESCHERICHIA COLI SUSCEPTIBILITIES TO FOLLOW Performed at [facility], [address] [place], [place] [address]    Report Status PENDING  Incomplete  SARS Coronavirus 2 by RT PCR (hospital order, performed in [facility] hospital lab) Nasopharyngeal Nasopharyngeal Swab     Status: None   Collection Time: 03/01/20 11:04 AM   Specimen: Nasopharyngeal Swab  Result Value Ref Range Status   SARS Coronavirus 2 NEGATIVE NEGATIVE Final    Comment: (NOTE) SARS-CoV-2 target nucleic acids are NOT DETECTED.  The SARS-CoV-2 RNA is generally detectable in upper and lower respiratory specimens during the acute phase of infection. The lowest concentration of SARS-CoV-2 viral copies this assay can detect is 250 copies / mL. A negative result does not preclude SARS-CoV-2 infection and should not be used as the sole basis for treatment or other patient management decisions.  A negative result may occur with improper specimen collection / handling, submission of specimen other than nasopharyngeal swab, presence of viral mutation(s) within the areas targeted by this assay, and inadequate number of viral copies (<250 copies / mL). A negative result must be combined with clinical observations, patient history, and epidemiological information.  Fact Sheet for Patients:   BoilerBrush.com.cy  Fact Sheet for Healthcare Providers: https://pope.com/  This test is not yet approved or  cleared by the [place] FDA and has been authorized for detection and/or  diagnosis of SARS-CoV-2 by FDA under an Emergency Use Authorization (EUA).  This EUA will remain in effect (meaning this test can be used) for the duration of the COVID-19 declaration under Section 564(b)(1) of the Act, 21 U.S.C. section 360bbb-3(b)(1), unless the authorization is terminated or revoked sooner.  Performed at [facility], [address] [place], [place] [address]       Imaging Studies   MR BRAIN WO CONTRAST  Result Date: 03/01/2020 CLINICAL DATA:  Stroke suspected. Additional provided: Worsening left-sided weakness, slurred speech. EXAM: MRI HEAD WITHOUT CONTRAST TECHNIQUE: Multiplanar, multiecho pulse sequences of the brain and surrounding structures were obtained without intravenous contrast. COMPARISON:  Head CT 03/01/2020, noncontrast head CT, CT angiogram head/neck and CT perfusion 10/29/2019 FINDINGS: Brain: Mild intermittent motion degradation. 6 mm focus of restricted diffusion within the posterior limb of right internal capsule may reflect wallerian degeneration or a small acute/early subacute infarct (series 5, image 25). There is also an 11 mm cortically based focus of diffusion weighted hyperintensity within the right temporoparietal junction which does not definitively demonstrate T2 shine through on the ADC map and may reflect a small focus of subacute ischemia (series 5, image 25). No evidence of acute or recent subacute infarct elsewhere within the brain. Fairly extensive multifocal chronic infarction changes within the right MCA vascular territory have progressed as compared to prior head CT 10/29/2019. Portions of the frontal, parietal, occipital and temporal lobes are affected. Redemonstrated small chronic cortical infarct within the left parietal lobe (series 15, images 30 5-30). Stable background moderate to advanced chronic small vessel ischemic disease within the cerebral white matter and pons. Redemonstrated small chronic infarct within the right  cerebellum. Stable, moderate generalized parenchymal atrophy. Chronic hemosiderin deposition associated with some of the chronic right MCA territory infarcts. Redemonstrated mineralization within the basal ganglia and dentate nuclei. No evidence of intracranial mass. No extra-axial fluid collection. No midline shift. Vascular: Expected proximal arterial flow voids. Skull and upper cervical spine: No focal marrow lesion. Sinuses/Orbits: Visualized orbits show no acute finding. Mild ethmoid sinus mucosal thickening. Right mastoid effusion. Other: 13 mm T2 hyperintense lesion along the medial aspect of the left parotid gland suspicious for primary parotid neoplasm (series 10, image 2). IMPRESSION: 1. 6 mm focus of restricted diffusion within the posterior limb of right internal capsule. This may reflect wallerian degeneration or a small acute/early subacute infarct. 2. 11 mm cortically based focus of diffusion weighted hyperintensity within the right temporoparietal junction suspicious for subacute ischemia. 3. Extensive chronic infarction changes with the right MCA vascular territory, progressed as compared to prior head CT 10/29/2019. 4. Redemonstrated small chronic cortically based infarct within the left parietal lobe. 5. Stable background moderate to advanced chronic small vessel ischemic disease within the cerebral white matter and pons. 6. Redemonstrated chronic infarct within the right cerebellum. 7. Stable moderate generalized parenchymal atrophy. 8. 13 mm T2 hyperintense lesion within the medial aspect of the left parotid gland suspicious for primary parotid neoplasm. A nonemergent contrast-enhanced neck CT is recommended for further evaluation. 9. Mild ethmoid sinus mucosal thickening. 10. Right mastoid effusion. Electronically Signed   By: Jackey Loge DO   On: 03/01/2020 21:32   ECHOCARDIOGRAM COMPLETE  Result Date: 03/03/2020    ECHOCARDIOGRAM REPORT   Patient Name:   Sarah Delacruz Date of Exam:  03/02/2020 Medical Rec #:  [id number]     Height:       66.0 in Accession #:    [id number]    Weight:       106.5 lb Date of Birth:  [birth year]    BSA:          1.530 m Patient Age:    86 years      BP:           124/70 mmHg Patient Gender: F  HR:           82 bpm. Exam Location:  [facility] Procedure: 2D Echo, Cardiac Doppler and Color Doppler Indications:     I163.9 Stroke  History:         Patient has no prior history of Echocardiogram examinations.                  Risk Factors:Hypertension and Dyslipidemia. Stroke. Chronic                  kidney disease. LBBB. Chronic diastolic heart failure.  Sonographer:     Sedonia Small Rodgers-Jones Referring Phys:  [id number] NIU Diagnosing Phys: Harold Hedge MD IMPRESSIONS  1. Left ventricular ejection fraction, by estimation, is 60 to 65%. The left ventricle has normal function. The left ventricle has no regional wall motion abnormalities. There is severe concentric left ventricular hypertrophy. Left ventricular diastolic  parameters are consistent with Grade I diastolic dysfunction (impaired relaxation).  2. Right ventricular systolic function is normal. The right ventricular size is normal. There is severely elevated pulmonary artery systolic pressure.  3. Left atrial size was mild to moderately dilated.  4. Right atrial size was mildly dilated.  5. The mitral valve is grossly normal. Moderate mitral valve regurgitation.  6. The aortic valve was not well visualized. Aortic valve regurgitation is mild. FINDINGS  Left Ventricle: Left ventricular ejection fraction, by estimation, is 60 to 65%. The left ventricle has normal function. The left ventricle has no regional wall motion abnormalities. The left ventricular internal cavity size was normal in size. There is  severe concentric left ventricular hypertrophy. Left ventricular diastolic parameters are consistent with Grade I diastolic dysfunction (impaired relaxation). Right Ventricle: The right ventricular size is  normal. No increase in right ventricular wall thickness. Right ventricular systolic function is normal. There is severely elevated pulmonary artery systolic pressure. The tricuspid regurgitant velocity is 3.69 m/s, and with an assumed right atrial pressure of 10 mmHg, the estimated right ventricular systolic pressure is 64.5 mmHg. Left Atrium: Left atrial size was mild to moderately dilated. Right Atrium: Right atrial size was mildly dilated. Pericardium: There is no evidence of pericardial effusion. Mitral Valve: The mitral valve is grossly normal. Moderate mitral valve regurgitation. Tricuspid Valve: The tricuspid valve is not well visualized. Tricuspid valve regurgitation is trivial. Aortic Valve: The aortic valve was not well visualized. Aortic valve regurgitation is mild. Aortic regurgitation PHT measures 340 msec. Pulmonic Valve: The pulmonic valve was not well visualized. Pulmonic valve regurgitation is trivial. Aorta: The aortic root is normal in size and structure. IAS/Shunts: The interatrial septum was not assessed.  LEFT VENTRICLE PLAX 2D LVIDd:         3.27 cm  Diastology LVIDs:         2.12 cm  LV e' lateral:   4.43 cm/s LV PW:         1.15 cm  LV E/e' lateral: 24.2 LV IVS:        1.29 cm  LV e' medial:    4.43 cm/s LVOT diam:     1.50 cm  LV E/e' medial:  24.2 LVOT Area:     1.77 cm  RIGHT VENTRICLE             IVC RV Basal diam:  3.39 cm     IVC diam: 1.37 cm RV S prime:     14.00 cm/s TAPSE (M-mode): 2.0 cm LEFT ATRIUM  Index       RIGHT ATRIUM           Index LA diam:        4.40 cm 2.88 cm/m  RA Area:     11.40 cm LA Vol (A2C):   62.1 ml 40.59 ml/m RA Volume:   25.80 ml  16.86 ml/m LA Vol (A4C):   78.6 ml 51.38 ml/m LA Biplane Vol: 70.8 ml 46.28 ml/m  AORTIC VALVE AI PHT:      340 msec  AORTA Ao Root diam: 2.60 cm MITRAL VALVE                TRICUSPID VALVE MV Area (PHT): 2.37 cm     TR Peak grad:   54.5 mmHg MV Decel Time: 320 msec     TR Vmax:        369.00 cm/s MV E velocity:  107.00 cm/s MV A velocity: 128.00 cm/s  SHUNTS MV E/A ratio:  0.84         Systemic Diam: 1.50 cm Harold Hedge MD Electronically signed by Harold Hedge MD Signature Date/Time: 03/03/2020/7:32:39 AM    Final      Medications   Scheduled Meds: . aspirin EC  81 mg Oral Daily  . calcium-vitamin D  1 tablet Oral Q breakfast  . clopidogrel  75 mg Oral Daily  . enoxaparin (LOVENOX) injection  40 mg Subcutaneous Q24H  . hydrALAZINE  10 mg Oral BID  . loratadine  10 mg Oral Daily  . metoprolol succinate  50 mg Oral Daily  . pantoprazole  40 mg Oral Daily  . pravastatin  40 mg Oral QPC supper  . vitamin B-12  1,000 mcg Oral Daily   Continuous Infusions:      LOS: 1 day    Time spent: 30 minutes    Pennie Banter, DO [facility]  03/03/2020, 6:16 PM    If 7PM-7AM, please contact night-coverage. How to contact the [facility] Attending or Consulting provider 7A - 7P or covering provider during after hours 7P -7A, for this patient?    1. Check the care team in [facility] and look for a) attending/consulting [facility] provider listed and b) the [facility] team listed 2. Log into [email or web address] and use [place]'s universal password to access. If you do not have the password, please contact the hospital operator. 3. Locate the [facility] provider you are looking for under [facility] and page to a number that you can be directly reached. 4. If you still have difficulty reaching the provider, please page the [facility] (Director on Call) for the Hospitalists listed on amion for assistance.

## 2020-03-03 NOTE — Progress Notes (Signed)
[facility] Cardiology    SUBJECTIVE: Denies any palpitations tachycardia no chest pain still has left-sided hemiparesis   Vitals:   03/03/20 0005 03/03/20 0453 03/03/20 0803 03/03/20 1148  BP: (!) 126/55 (!) 134/56 115/84 (!) 165/58  Pulse: 81 78 82 71  Resp: 18 14 14 15   Temp: 97.7 F (36.5 C) 97.6 F (36.4 C) 98.2 F (36.8 C) 97.8 F (36.6 C)  TempSrc: Oral Oral  Oral  SpO2: 100% 93% 99% 100%  Weight:      Height:         Intake/Output Summary (Last 24 hours) at 03/03/2020 1158 Last data filed at 03/03/2020 0936 Gross per 24 hour  Intake 0 ml  Output 1600 ml  Net -1600 ml      PHYSICAL EXAM  General: Well developed, well nourished, in no acute distress HEENT:  Normocephalic and atramatic Neck:  No JVD.  Lungs: Clear bilaterally to auscultation and percussion. Heart: Irregular irregular. Normal S1 and S2 without gallops or murmurs.  Abdomen: Bowel sounds are positive, abdomen soft and non-tender  Msk:  Back normal, normal gait. Normal strength and tone for age. Extremities: No clubbing, cyanosis or edema.   Neuro: Alert and oriented X 3.  Left-sided hemiparesis Psych:  Good affect, responds appropriately   LABS: Basic Metabolic Panel: Recent Labs    03/02/20 1823 03/03/20 0546  NA 132* 136  K 3.3* 3.2*  CL 98 102  CO2 24 22  GLUCOSE 126* 82  BUN 13 12  CREATININE 0.88 0.81  CALCIUM 8.5* 8.5*  MG  --  1.6*   Liver Function Tests: Recent Labs    03/01/20 1239  AST 19  ALT 10  ALKPHOS 55  BILITOT 0.7  PROT 7.3  ALBUMIN 3.2*   No results for input(s): LIPASE, AMYLASE in the last 72 hours. CBC: Recent Labs    03/01/20 0938  WBC 8.1  NEUTROABS 4.4  HGB 11.8*  HCT 35.3*  MCV 86.7  PLT 466*   Cardiac Enzymes: No results for input(s): CKTOTAL, CKMB, CKMBINDEX, TROPONINI in the last 72 hours. BNP: Invalid input(s): POCBNP D-Dimer: No results for input(s): DDIMER in the last 72 hours. Hemoglobin A1C: Recent Labs    03/02/20 0219  HGBA1C 6.1*     Fasting Lipid Panel: Recent Labs    03/02/20 0219  CHOL 133  HDL 51  LDLCALC 72  TRIG 51  CHOLHDL 2.6   Thyroid Function Tests: Recent Labs    03/02/20 0219  TSH 2.728   Anemia Panel: No results for input(s): VITAMINB12, FOLATE, FERRITIN, TIBC, IRON, RETICCTPCT in the last 72 hours.  MR BRAIN WO CONTRAST  Result Date: 03/01/2020 CLINICAL DATA:  Stroke suspected. Additional provided: Worsening left-sided weakness, slurred speech. EXAM: MRI HEAD WITHOUT CONTRAST TECHNIQUE: Multiplanar, multiecho pulse sequences of the brain and surrounding structures were obtained without intravenous contrast. COMPARISON:  Head CT 03/01/2020, noncontrast head CT, CT angiogram head/neck and CT perfusion 10/29/2019 FINDINGS: Brain: Mild intermittent motion degradation. 6 mm focus of restricted diffusion within the posterior limb of right internal capsule may reflect wallerian degeneration or a small acute/early subacute infarct (series 5, image 25). There is also an 11 mm cortically based focus of diffusion weighted hyperintensity within the right temporoparietal junction which does not definitively demonstrate T2 shine through on the ADC map and may reflect a small focus of subacute ischemia (series 5, image 25). No evidence of acute or recent subacute infarct elsewhere within the brain. Fairly extensive multifocal chronic infarction changes within  the right MCA vascular territory have progressed as compared to prior head CT 10/29/2019. Portions of the frontal, parietal, occipital and temporal lobes are affected. Redemonstrated small chronic cortical infarct within the left parietal lobe (series 15, images 30 5-30). Stable background moderate to advanced chronic small vessel ischemic disease within the cerebral white matter and pons. Redemonstrated small chronic infarct within the right cerebellum. Stable, moderate generalized parenchymal atrophy. Chronic hemosiderin deposition associated with some of the  chronic right MCA territory infarcts. Redemonstrated mineralization within the basal ganglia and dentate nuclei. No evidence of intracranial mass. No extra-axial fluid collection. No midline shift. Vascular: Expected proximal arterial flow voids. Skull and upper cervical spine: No focal marrow lesion. Sinuses/Orbits: Visualized orbits show no acute finding. Mild ethmoid sinus mucosal thickening. Right mastoid effusion. Other: 13 mm T2 hyperintense lesion along the medial aspect of the left parotid gland suspicious for primary parotid neoplasm (series 10, image 2). IMPRESSION: 1. 6 mm focus of restricted diffusion within the posterior limb of right internal capsule. This may reflect wallerian degeneration or a small acute/early subacute infarct. 2. 11 mm cortically based focus of diffusion weighted hyperintensity within the right temporoparietal junction suspicious for subacute ischemia. 3. Extensive chronic infarction changes with the right MCA vascular territory, progressed as compared to prior head CT 10/29/2019. 4. Redemonstrated small chronic cortically based infarct within the left parietal lobe. 5. Stable background moderate to advanced chronic small vessel ischemic disease within the cerebral white matter and pons. 6. Redemonstrated chronic infarct within the right cerebellum. 7. Stable moderate generalized parenchymal atrophy. 8. 13 mm T2 hyperintense lesion within the medial aspect of the left parotid gland suspicious for primary parotid neoplasm. A nonemergent contrast-enhanced neck CT is recommended for further evaluation. 9. Mild ethmoid sinus mucosal thickening. 10. Right mastoid effusion. Electronically Signed   By: Jackey Loge DO   On: 03/01/2020 21:32   ECHOCARDIOGRAM COMPLETE  Result Date: 03/03/2020    ECHOCARDIOGRAM REPORT   Patient Name:   Sarah Delacruz Date of Exam: 03/02/2020 Medical Rec #:  [id number]     Height:       66.0 in Accession #:    [id number]    Weight:       106.5 lb Date of  Birth:  August 21, 1924    BSA:          1.530 m Patient Age:    86 years      BP:           124/70 mmHg Patient Gender: F             HR:           82 bpm. Exam Location:  [facility] Procedure: 2D Echo, Cardiac Doppler and Color Doppler Indications:     I163.9 Stroke  History:         Patient has no prior history of Echocardiogram examinations.                  Risk Factors:Hypertension and Dyslipidemia. Stroke. Chronic                  kidney disease. LBBB. Chronic diastolic heart failure.  Sonographer:     Sedonia Small Rodgers-Jones Referring Phys:  [id number] Diagnosing Phys: Harold Hedge MD IMPRESSIONS  1. Left ventricular ejection fraction, by estimation, is 60 to 65%. The left ventricle has normal function. The left ventricle has no regional wall motion abnormalities. There is severe concentric left ventricular hypertrophy. Left ventricular diastolic  parameters  are consistent with Grade I diastolic dysfunction (impaired relaxation).  2. Right ventricular systolic function is normal. The right ventricular size is normal. There is severely elevated pulmonary artery systolic pressure.  3. Left atrial size was mild to moderately dilated.  4. Right atrial size was mildly dilated.  5. The mitral valve is grossly normal. Moderate mitral valve regurgitation.  6. The aortic valve was not well visualized. Aortic valve regurgitation is mild. FINDINGS  Left Ventricle: Left ventricular ejection fraction, by estimation, is 60 to 65%. The left ventricle has normal function. The left ventricle has no regional wall motion abnormalities. The left ventricular internal cavity size was normal in size. There is  severe concentric left ventricular hypertrophy. Left ventricular diastolic parameters are consistent with Grade I diastolic dysfunction (impaired relaxation). Right Ventricle: The right ventricular size is normal. No increase in right ventricular wall thickness. Right ventricular systolic function is normal. There is severely  elevated pulmonary artery systolic pressure. The tricuspid regurgitant velocity is 3.69 m/s, and with an assumed right atrial pressure of 10 mmHg, the estimated right ventricular systolic pressure is 64.5 mmHg. Left Atrium: Left atrial size was mild to moderately dilated. Right Atrium: Right atrial size was mildly dilated. Pericardium: There is no evidence of pericardial effusion. Mitral Valve: The mitral valve is grossly normal. Moderate mitral valve regurgitation. Tricuspid Valve: The tricuspid valve is not well visualized. Tricuspid valve regurgitation is trivial. Aortic Valve: The aortic valve was not well visualized. Aortic valve regurgitation is mild. Aortic regurgitation PHT measures 340 msec. Pulmonic Valve: The pulmonic valve was not well visualized. Pulmonic valve regurgitation is trivial. Aorta: The aortic root is normal in size and structure. IAS/Shunts: The interatrial septum was not assessed.  LEFT VENTRICLE PLAX 2D LVIDd:         3.27 cm  Diastology LVIDs:         2.12 cm  LV e' lateral:   4.43 cm/s LV PW:         1.15 cm  LV E/e' lateral: 24.2 LV IVS:        1.29 cm  LV e' medial:    4.43 cm/s LVOT diam:     1.50 cm  LV E/e' medial:  24.2 LVOT Area:     1.77 cm  RIGHT VENTRICLE             IVC RV Basal diam:  3.39 cm     IVC diam: 1.37 cm RV S prime:     14.00 cm/s TAPSE (M-mode): 2.0 cm LEFT ATRIUM             Index       RIGHT ATRIUM           Index LA diam:        4.40 cm 2.88 cm/m  RA Area:     11.40 cm LA Vol (A2C):   62.1 ml 40.59 ml/m RA Volume:   25.80 ml  16.86 ml/m LA Vol (A4C):   78.6 ml 51.38 ml/m LA Biplane Vol: 70.8 ml 46.28 ml/m  AORTIC VALVE AI PHT:      340 msec  AORTA Ao Root diam: 2.60 cm MITRAL VALVE                TRICUSPID VALVE MV Area (PHT): 2.37 cm     TR Peak grad:   54.5 mmHg MV Decel Time: 320 msec     TR Vmax:        369.00 cm/s MV E velocity:  107.00 cm/s MV A velocity: 128.00 cm/s  SHUNTS MV E/A ratio:  0.84         Systemic Diam: 1.50 cm Harold Hedge MD  Electronically signed by Harold Hedge MD Signature Date/Time: 03/03/2020/7:32:39 AM    Final      Echo preserved overall left ventricular function of around 60%  TELEMETRY: History of paroxysmal atrial tachycardia fibrillation  ASSESSMENT AND PLAN:  Principal Problem:   Stroke [facility]) Active Problems:   Chronic diastolic congestive heart failure (HCC)   Essential hypertension   HLD (hyperlipidemia)   Atrial fibrillation (HCC)   CKD (chronic kidney disease), stage IIIa   Depression   Hyponatremia   Hyperkalemia   Goals of care, counseling/discussion   Palliative care by specialist   DNR (do not resuscitate) discussion   Acute embolic stroke [facility])    Plan Continue treatment management for CVA recommend physical therapy occupational therapy speech therapy Recommend transitioning to Eliquis long-term for  atrial fibrillation protection Consider discontinuing aspirin and/or Plavix as per neurology's discretion while on Eliquis Continue hypertension management and control Recommend have the patient follow-up with nephrology for renal insufficiency Agree with statin therapy for hyperlipidemia Follow-up with cardiology 1 to 2 weeks after discharge   Alwyn Pea, MD 03/03/2020 11:58 AM

## 2020-03-03 NOTE — Progress Notes (Signed)
Palliative: Sarah Delacruz is lying quietly in bed.  She greets me making and mostly keeping eye contact.  She is alert and oriented to person and place, I do not ask time.  She is able to make her basic needs known.  There is no family at bedside at this time.  She has no questions or concerns at this time.  Sarah Delacruz has been transitioned to inpatient status, and is to be evaluated by physical, occupational and speech therapy.  Call to daughter Sarah Delacruz who is Sarah Delacruz's main caregiver, living in Sarah Delacruz's home.  Sarah Delacruz states that they would like to have a trial of short-term rehab.  The goal is to ultimately return to Sarah Delacruz's home with Sarah Delacruz and Sarah Delacruz as main caregivers.  Sarah Delacruz has the help and equipment needed at home. Sarah Delacruz and I talked about CODE STATUS, "treat the treatable, but allowing natural death".  Sarah Delacruz states that her sister Sarah Delacruz discussed this with family yesterday and family has agreed that DNR status would be the best for Sarah Delacruz.  I reassured Sarah Delacruz that we will continue to treat the treatable and care for Sarah Delacruz.  Sarah Delacruz and I talked about palliative medicine, specialized medical care for people with chronic illness.  I share that palliative talks about the "what if's and maybe's".  I share that our goal is to continue to keep Sarah Delacruz at the center, making sure that this time looks like and feels like what she wants it to be.   Sarah Delacruz denies questions or concerns at this time.  Conference with attending, bedside nursing staff, transitions of care team related to patient condition, needs, change in CODE STATUS.   Plan:   Now DNR status, "treat the treatable, but allowing natural death".  Agreeable to short-term rehab.  Ultimate goal is to return home with family.   Outpatient palliative to follow.     25 minutes Sarah Carmel, NP Palliative medicine team Team phone [phone] Greater than 50% of this time was spent counseling and coordinating care related to the  above assessment and plan.

## 2020-03-03 NOTE — Progress Notes (Signed)
[facility]    SUBJECTIVE: Patient seen in bed eating daughter at the bedside feels reasonably well still has some left-sided hemiparesis and some aphasia.  Denies any palpitations tachycardia or chest pain has reduced renal function and low body mass because of atrial fibrillation recommend Eliquis for anticoagulation prior to discharge   Vitals:   03/02/20 2025 03/03/20 0005 03/03/20 0453 03/03/20 0803  BP: (!) 152/83 (!) 126/55 (!) 134/56 115/84  Pulse: 78 81 78 82  Resp: Temp: 98 F (36.7 C) 97.7 F (36.5 C) 97.6 F (36.4 C) 98.2 F (36.8 C)  TempSrc: Oral Oral Oral   SpO2: 99% 100% 93% 99%  Weight:      Height:         Intake/Output Summary (Last 24 hours) at 03/03/2020 0847 Last data filed at 03/03/2020 0000 Gross per 24 hour  Intake 0 ml  Output 1100 ml  Net -1100 ml      PHYSICAL EXAM  General: Well developed, well nourished, in no acute distress HEENT:  Normocephalic and atramatic Neck:  No JVD.  Lungs: Clear bilaterally to auscultation and percussion. Heart: HRRR . Normal S1 and S2 without gallops or murmurs.  Abdomen: Bowel sounds are positive, abdomen soft and non-tender  Msk:  Back normal, normal gait. Normal strength and tone for age. Extremities: No clubbing, cyanosis or edema.   Neuro: Alert and oriented X 3.  Left-sided hemiparesis Psych:  Good affect, responds appropriately   LABS: Basic Metabolic Panel: Recent Labs    03/02/20 1823 03/03/20 0546  NA 132* 136  K 3.3* 3.2*  CL 98 102  CO2 24 22  GLUCOSE 126* 82  BUN 13 12  CREATININE 0.88 0.81  CALCIUM 8.5* 8.5*  MG  --  1.6*   Liver Function Tests: Recent Labs    03/01/20 1239  AST 19  ALT 10  ALKPHOS 55  BILITOT 0.7  PROT 7.3  ALBUMIN 3.2*   No results for input(s): LIPASE, AMYLASE in the last 72 hours. CBC: Recent Labs    03/01/20 0938  WBC 8.1  NEUTROABS 4.4  HGB 11.8*  HCT 35.3*  MCV 86.7  PLT 466*   Cardiac Enzymes: No results for input(s):  CKTOTAL, CKMB, CKMBINDEX, TROPONINI in the last 72 hours. BNP: Invalid input(s): POCBNP D-Dimer: No results for input(s): DDIMER in the last 72 hours. Hemoglobin A1C: Recent Labs    03/02/20 0219  HGBA1C 6.1*   Fasting Lipid Panel: Recent Labs    03/02/20 0219  CHOL 133  HDL 51  LDLCALC 72  TRIG 51  CHOLHDL 2.6   Thyroid Function Tests: Recent Labs    03/02/20 0219  TSH 2.728   Anemia Panel: No results for input(s): VITAMINB12, FOLATE, FERRITIN, TIBC, IRON, RETICCTPCT in the last 72 hours.  CT Head Wo Contrast  Addendum Date: 03/01/2020   ADDENDUM REPORT: 03/01/2020 11:14 ADDENDUM: Study discussed by telephone with Dr. Dorothea Glassman on 03/01/2020 at 1058 hours. Electronically Signed   By: Odessa Fleming M.D.   On: 03/01/2020 11:14   Result Date: 03/01/2020 CLINICAL DATA:  86 year old female with increased left side weakness and slurred speech onset this morning. Code stroke presentation in March with right MCA M2 occlusion at that time. EXAM: CT HEAD WITHOUT CONTRAST TECHNIQUE: Contiguous axial images were obtained from the base of the skull through the vertex without intravenous contrast. COMPARISON:  Head CT and CTA CTP 10/29/2019. FINDINGS: Brain: Developing encephalomalacia in the right MCA territory which  appears to partially correspond to the areas of early cytotoxic edema on 10/29/2019. There is new involvement of the right inferior frontal gyrus, but this appears to have similar chronic density. However, there is more subacute appearing involvement of the right middle and superior frontal gyrus on series 2, image 20. No associated hemorrhage or mass effect. Furthermore there is also a new but chronic appearing small area of cortical encephalomalacia in the left superior perirolandic cortex on series 2, image 21. No intracranial mass effect. Mild ex vacuo lateral ventricular enlargement since March. Posterior fossa gray-white matter differentiation appears stable including small chronic  right cerebellar infarct. ASPECTS 9 (abnormal right M5 segment superimposed on suspected chronic ischemia elsewhere in the right MCA territory). Vascular: Calcified atherosclerosis at the skull base. No suspicious intracranial vascular hyperdensity. Skull: No acute osseous abnormality identified. Sinuses/Orbits: Visualized paranasal sinuses and mastoids are stable and well pneumatized. Other: Mild leftward gaze deviation, otherwise negative orbits and scalp. IMPRESSION: 1. Suspect acute or subacute on chronic right MCA territory ischemia. The most recent appearing cytotoxic edema is in the right superior/middle frontal gyrus (ASPECTS 9). No hemorrhage or mass effect. 2. Developing encephalomalacia elsewhere in the right frontal lobe thought related to evolution of the March infarct. 3. Small chronic right cerebellar infarct. Electronically Signed: By: Odessa Fleming M.D. On: 03/01/2020 10:53   MR BRAIN WO CONTRAST  Result Date: 03/01/2020 CLINICAL DATA:  Stroke suspected. Additional provided: Worsening left-sided weakness, slurred speech. EXAM: MRI HEAD WITHOUT CONTRAST TECHNIQUE: Multiplanar, multiecho pulse sequences of the brain and surrounding structures were obtained without intravenous contrast. COMPARISON:  Head CT 03/01/2020, noncontrast head CT, CT angiogram head/neck and CT perfusion 10/29/2019 FINDINGS: Brain: Mild intermittent motion degradation. 6 mm focus of restricted diffusion within the posterior limb of right internal capsule may reflect wallerian degeneration or a small acute/early subacute infarct (series 5, image 25). There is also an 11 mm cortically based focus of diffusion weighted hyperintensity within the right temporoparietal junction which does not definitively demonstrate T2 shine through on the ADC map and may reflect a small focus of subacute ischemia (series 5, image 25). No evidence of acute or recent subacute infarct elsewhere within the brain. Fairly extensive multifocal chronic  infarction changes within the right MCA vascular territory have progressed as compared to prior head CT 10/29/2019. Portions of the frontal, parietal, occipital and temporal lobes are affected. Redemonstrated small chronic cortical infarct within the left parietal lobe (series 15, images 30 5-30). Stable background moderate to advanced chronic small vessel ischemic disease within the cerebral white matter and pons. Redemonstrated small chronic infarct within the right cerebellum. Stable, moderate generalized parenchymal atrophy. Chronic hemosiderin deposition associated with some of the chronic right MCA territory infarcts. Redemonstrated mineralization within the basal ganglia and dentate nuclei. No evidence of intracranial mass. No extra-axial fluid collection. No midline shift. Vascular: Expected proximal arterial flow voids. Skull and upper cervical spine: No focal marrow lesion. Sinuses/Orbits: Visualized orbits show no acute finding. Mild ethmoid sinus mucosal thickening. Right mastoid effusion. Other: 13 mm T2 hyperintense lesion along the medial aspect of the left parotid gland suspicious for primary parotid neoplasm (series 10, image 2). IMPRESSION: 1. 6 mm focus of restricted diffusion within the posterior limb of right internal capsule. This may reflect wallerian degeneration or a small acute/early subacute infarct. 2. 11 mm cortically based focus of diffusion weighted hyperintensity within the right temporoparietal junction suspicious for subacute ischemia. 3. Extensive chronic infarction changes with the right MCA vascular territory, progressed  as compared to prior head CT 10/29/2019. 4. Redemonstrated small chronic cortically based infarct within the left parietal lobe. 5. Stable background moderate to advanced chronic small vessel ischemic disease within the cerebral white matter and pons. 6. Redemonstrated chronic infarct within the right cerebellum. 7. Stable moderate generalized parenchymal  atrophy. 8. 13 mm T2 hyperintense lesion within the medial aspect of the left parotid gland suspicious for primary parotid neoplasm. A nonemergent contrast-enhanced neck CT is recommended for further evaluation. 9. Mild ethmoid sinus mucosal thickening. 10. Right mastoid effusion. Electronically Signed   By: Jackey Loge DO   On: 03/01/2020 21:32   ECHOCARDIOGRAM COMPLETE  Result Date: 03/03/2020    ECHOCARDIOGRAM REPORT   Patient Name:   Sarah Delacruz Date of Exam: 03/02/2020 Medical Rec #:  [id number]     Height:       66.0 in Accession #:    [id number]    Weight:       106.5 lb Date of Birth:  [birth year]    BSA:          1.530 m Patient Age:    86 years      BP:           124/70 mmHg Patient Gender: F             HR:           82 bpm. Exam Location:  [facility] Procedure: 2D Echo, Cardiac Doppler and Color Doppler Indications:     I163.9 Stroke  History:         Patient has no prior history of Echocardiogram examinations.                  Risk Factors:Hypertension and Dyslipidemia. Stroke. Chronic                  kidney disease. LBBB. Chronic diastolic heart failure.  Sonographer:     Sedonia Small Rodgers-Jones Referring Phys:  [id number] NIU Diagnosing Phys: Harold Hedge MD IMPRESSIONS  1. Left ventricular ejection fraction, by estimation, is 60 to 65%. The left ventricle has normal function. The left ventricle has no regional wall motion abnormalities. There is severe concentric left ventricular hypertrophy. Left ventricular diastolic  parameters are consistent with Grade I diastolic dysfunction (impaired relaxation).  2. Right ventricular systolic function is normal. The right ventricular size is normal. There is severely elevated pulmonary artery systolic pressure.  3. Left atrial size was mild to moderately dilated.  4. Right atrial size was mildly dilated.  5. The mitral valve is grossly normal. Moderate mitral valve regurgitation.  6. The aortic valve was not well visualized. Aortic valve regurgitation is mild.  FINDINGS  Left Ventricle: Left ventricular ejection fraction, by estimation, is 60 to 65%. The left ventricle has normal function. The left ventricle has no regional wall motion abnormalities. The left ventricular internal cavity size was normal in size. There is  severe concentric left ventricular hypertrophy. Left ventricular diastolic parameters are consistent with Grade I diastolic dysfunction (impaired relaxation). Right Ventricle: The right ventricular size is normal. No increase in right ventricular wall thickness. Right ventricular systolic function is normal. There is severely elevated pulmonary artery systolic pressure. The tricuspid regurgitant velocity is 3.69 m/s, and with an assumed right atrial pressure of 10 mmHg, the estimated right ventricular systolic pressure is 64.5 mmHg. Left Atrium: Left atrial size was mild to moderately dilated. Right Atrium: Right atrial size was mildly dilated. Pericardium: There is no evidence of pericardial effusion.  Mitral Valve: The mitral valve is grossly normal. Moderate mitral valve regurgitation. Tricuspid Valve: The tricuspid valve is not well visualized. Tricuspid valve regurgitation is trivial. Aortic Valve: The aortic valve was not well visualized. Aortic valve regurgitation is mild. Aortic regurgitation PHT measures 340 msec. Pulmonic Valve: The pulmonic valve was not well visualized. Pulmonic valve regurgitation is trivial. Aorta: The aortic root is normal in size and structure. IAS/Shunts: The interatrial septum was not assessed.  LEFT VENTRICLE PLAX 2D LVIDd:         3.27 cm  Diastology LVIDs:         2.12 cm  LV e' lateral:   4.43 cm/s LV PW:         1.15 cm  LV E/e' lateral: 24.2 LV IVS:        1.29 cm  LV e' medial:    4.43 cm/s LVOT diam:     1.50 cm  LV E/e' medial:  24.2 LVOT Area:     1.77 cm  RIGHT VENTRICLE             IVC RV Basal diam:  3.39 cm     IVC diam: 1.37 cm RV S prime:     14.00 cm/s TAPSE (M-mode): 2.0 cm LEFT ATRIUM             Index        RIGHT ATRIUM           Index LA diam:        4.40 cm 2.88 cm/m  RA Area:     11.40 cm LA Vol (A2C):   62.1 ml 40.59 ml/m RA Volume:   25.80 ml  16.86 ml/m LA Vol (A4C):   78.6 ml 51.38 ml/m LA Biplane Vol: 70.8 ml 46.28 ml/m  AORTIC VALVE AI PHT:      340 msec  AORTA Ao Root diam: 2.60 cm MITRAL VALVE                TRICUSPID VALVE MV Area (PHT): 2.37 cm     TR Peak grad:   54.5 mmHg MV Decel Time: 320 msec     TR Vmax:        369.00 cm/s MV E velocity: 107.00 cm/s MV A velocity: 128.00 cm/s  SHUNTS MV E/A ratio:  0.84         Systemic Diam: 1.50 cm Harold Hedge MD Electronically signed by Harold Hedge MD Signature Date/Time: 03/03/2020/7:32:39 AM    Final      Echo preserved left ventricular function ejection fraction of 60%  TELEMETRY: Sinus rhythm converted from atrial fibrillation:  ASSESSMENT AND PLAN:  Principal Problem:   Stroke [facility]) Active Problems:   Chronic diastolic congestive heart failure (HCC)   Essential hypertension   HLD (hyperlipidemia)   Atrial fibrillation (HCC)   CKD (chronic kidney disease), stage IIIa   Depression   Hyponatremia   Hyperkalemia   Goals of care, counseling/discussion   Palliative care by specialist   DNR (do not resuscitate) discussion   Acute embolic stroke (HCC)    Plan Atrial fibrillation paroxysmal recommend anticoagulation when cleared by neurology Consider amiodarone for rhythm control I recommend Eliquis twice a day probably 2.5 twice daily Recommend clearing with neurology whether to continue aspirin and Plavix concurrently with Eliquis Continue rate control with metoprolol We will probably start amiodarone 200 mg twice a day to help maintain sinus rhythm Agree with statin therapy Recommend physical therapy occupational therapy and speech therapy  Felice Deem  Salome Arnt Harley Fitzwater, MD 03/03/2020 8:47 AM

## 2020-03-03 NOTE — Progress Notes (Signed)
Physical Therapy Treatment Patient Details Name: Sarah Delacruz MRN: [id number] DOB: 10-29-1923 Today's Date: 03/03/2020    History of Present Illness Patient is a 86 y.o. 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  Patient was also weaker on the left than her baseline. Head CT reprot chronic right cerebellar infarct and questionable acute to subacute right MCA infarct. Further work up ongoing     PT Comments    Patient received in supine, finger on right hand bandaged, but  bleeding. Assisted patient to clean it up, RN notified. Patient agrees to PT session. Requires max assist with supine to sit with verbal cues. Once sitting up and positioned on side of bed patient requires max assist to maintain sitting balance. Heavy left lean despite multimodal cues to remain upright. Patient performed weight shifting in sitting with mod/max assist. Returned to supine with max +2 assist. She will continue to benefit from skilled PT while here to improve functional independence.    Follow Up Recommendations  SNF;Supervision/Assistance - 24 hour     Equipment Recommendations  None recommended by PT    Recommendations for Other Services       Precautions / Restrictions Precautions Precautions: Fall Restrictions Weight Bearing Restrictions: No    Mobility  Bed Mobility Overal bed mobility: Needs Assistance Bed Mobility: Supine to Sit;Sit to Supine     Supine to sit: Max assist Sit to supine: Max assist;+2 for physical assistance      Transfers                 General transfer comment: not safe to attempt   Ambulation/Gait             General Gait Details: unable to safely attempt at this time    Stairs             Wheelchair Mobility    Modified Rankin (Stroke  Patients Only)       Balance Overall balance assessment: Needs assistance Sitting-balance support: Single extremity supported Sitting balance-Leahy Scale: Zero Sitting balance - Comments: patient required Total A to Max A to maintain midline sitting balance. patient with extreem left lateral lean in sitting position despite visual cues, tactile feedback and faciliation. When patient using RUE holding bed rail, patient was able to maintain midline sitting balance with brief periods with mod A. Postural control: Left lateral lean                                  Cognition Arousal/Alertness: Awake/alert Behavior During Therapy: WFL for tasks assessed/performed Overall Cognitive Status: History of cognitive impairments - at baseline                                 General Comments: mostly appropriate with conversation, difficult to understand due to slurred speech.      Exercises      General Comments        Pertinent Vitals/Pain Pain Assessment: No/denies pain    Home Living                      Prior Function  PT Goals (current goals can now be found in the care plan section) Acute Rehab PT Goals Patient Stated Goal: to go home with family  PT Goal Formulation: With patient Time For Goal Achievement: 03/16/20 Potential to Achieve Goals: Fair Progress towards PT goals: Progressing toward goals    Frequency    7X/week      PT Plan Current plan remains appropriate    Co-evaluation              AM-PAC PT "6 Clicks" Mobility   Outcome Measure  Help needed turning from your back to your side while in a flat bed without using bedrails?: A Lot Help needed moving from lying on your back to sitting on the side of a flat bed without using bedrails?: A Lot Help needed moving to and from a bed to a chair (including a wheelchair)?: Total Help needed standing up from a chair using your arms (e.g., wheelchair or bedside  chair)?: Total Help needed to walk in hospital room?: Total Help needed climbing 3-5 steps with a railing? : Total 6 Click Score: 8    End of Session   Activity Tolerance: Patient limited by fatigue Patient left: in bed;with bed alarm set;with call bell/phone within reach Nurse Communication: Other (comment) (finger on right hand bleeding through bandage) PT Visit Diagnosis: Hemiplegia and hemiparesis;Other abnormalities of gait and mobility (R26.89);Muscle weakness (generalized) (M62.81) Hemiplegia - Right/Left: Left Hemiplegia - dominant/non-dominant: Non-dominant Hemiplegia - caused by: Cerebral infarction     Time: 1100-1126 PT Time Calculation (min) (ACUTE ONLY): 26 min  Charges:  $Therapeutic Activity: 23-37 mins                     Valeriano Bain, PT, GCS 03/03/20,11:38 AM

## 2020-03-03 NOTE — TOC Initial Note (Signed)
Transition of Care [facility]) - Initial/Assessment Note    Patient Details  Name: Sarah Delacruz MRN: [id number] Date of Birth: 1923/12/26  Transition of Care [facility]) CM/SW Contact:    Allayne Butcher, RN Phone Number: 03/03/2020, 1:34 PM  Clinical Narrative:                 Patient is from home where she lives with her daughter Sedalia Muta.  Diane is the primary caregiver.  Patient is mostly in a wheelchair but was walking some with a walker with heavy family assist.  Patient also has a hospital bed at home.  Patient is current with Dr. Marcello Fennel, Diane provides transportation.  Family would be in agreement with patient going to [facility] for short term rehab but if unable to go to [place] they would like to take her home.  RNCM will send a bed request to [facility].   Expected Discharge Plan: Home w Home Health Services Barriers to Discharge: Continued Medical Work up   Patient Goals and CMS Choice Patient states their goals for this hospitalization and ongoing recovery are:: Daughter would like rehab at [facility] or for patient to come back home with home health CMS Medicare.gov Compare Post Acute Care list provided to:: Patient Represenative (must comment) Choice offered to / list presented to : Adult Children Sedalia Muta,  Carolan Clines)  Expected Discharge Plan and Services Expected Discharge Plan: Home w Home Health Services   Discharge Planning Services: CM Consult Post Acute Care Choice: Home Health Living arrangements for the past 2 months: Single Family Home                           HH Arranged: RN, PT, OT, Nurse's Aide, Social Work [facility] Agency: [facility] Date HH Agency Contacted: 03/03/20 Time HH Agency Contacted: 1332 Representative spoke with at [facility] Agency: Christy Gentles  Prior Living Arrangements/Services Living arrangements for the past 2 months: Single Family Home Lives with:: Adult Children Patient language and need for interpreter reviewed:: Yes Do you feel  safe going back to the place where you live?: Yes      Need for Family Participation in Patient Care: Yes (Comment) (stroke) Care giver support system in place?: Yes (comment) (daughter's) Current home services: DME (wheelchair, hospital bed, walker) Criminal Activity/Legal Involvement Pertinent to Current Situation/Hospitalization: No - Comment as needed  Activities of Daily Living Home Assistive Devices/Equipment: None ADL Screening (condition at time of admission) Patient's cognitive ability adequate to safely complete daily activities?: Yes Is the patient deaf or have difficulty hearing?: Yes Does the patient have difficulty seeing, even when wearing glasses/contacts?: No Does the patient have difficulty concentrating, remembering, or making decisions?: Yes Patient able to express need for assistance with ADLs?: Yes Does the patient have difficulty dressing or bathing?: Yes Independently performs ADLs?: No Communication: Independent Dressing (OT): Needs assistance Is this a change from baseline?: Pre-admission baseline Grooming: Independent Feeding: Independent Bathing: Needs assistance Is this a change from baseline?: Pre-admission baseline Toileting: Needs assistance Is this a change from baseline?: Pre-admission baseline In/Out Bed: Needs assistance Is this a change from baseline?: Pre-admission baseline Walks in Home: Independent Does the patient have difficulty walking or climbing stairs?: Yes Weakness of Legs: Both Weakness of Arms/Hands: Left  Permission Sought/Granted Permission sought to share information with : Case Manager, Family Supports, Other (comment) Permission granted to share information with : Yes, Verbal Permission Granted  Share Information with NAME: Kelton Pillar  Permission granted to share info w AGENCY: [facility]  Permission granted to share info w Relationship: daughters     Emotional Assessment Appearance:: Appears stated  age Attitude/Demeanor/Rapport: Unable to Assess Affect (typically observed): Quiet, Pleasant Orientation: : Oriented to Self, Oriented to Place, Oriented to  Time, Oriented to Situation Alcohol / Substance Use: Not Applicable Psych Involvement: No (comment)  Admission diagnosis:  Stroke [facility]) [I63.9] Cerebrovascular accident (CVA), unspecified mechanism (HCC) [I63.9] Acute embolic stroke [facility]) [I63.9] Patient Active Problem List   Diagnosis Date Noted   Acute embolic stroke (HCC) 03/02/2020   Goals of care, counseling/discussion    Palliative care by specialist    DNR (do not resuscitate) discussion    Stroke (HCC) 03/01/2020   HLD (hyperlipidemia) 03/01/2020   Atrial fibrillation (HCC) 03/01/2020   CKD (chronic kidney disease), stage IIIa 03/01/2020   Depression 03/01/2020   Hyponatremia 03/01/2020   Hyperkalemia 03/01/2020   Left hemiplegia (HCC) 12/05/2019   Acute right MCA stroke (HCC) 11/05/2019   Acute cerebral infarction (HCC)    Stage 3 chronic kidney disease    Chronic constipation    Chronic diastolic congestive heart failure (HCC)    Essential hypertension    Dysphagia, post-stroke    PCP:  Barbette Reichmann, MD Pharmacy:   [facility] [address] [place], [place] - [address] [place] [address] Phone: [phone] Fax: [phone]     Social Determinants of Health (SDOH) Interventions    Readmission Risk Interventions No flowsheet data found.

## 2020-03-03 NOTE — [place] FL2 (Signed)
[place] MEDICAID FL2 LEVEL OF CARE SCREENING TOOL     IDENTIFICATION  Patient Name: Sarah Delacruz Birthdate: [birth year] Sex: female Admission Date (Current Location): 03/01/2020  [place] and [place] Number:  Chiropodist and Address:  [facility], [address] [place], [place] [address]      Provider Number: [id number]  Attending Physician Name and Address:  Pennie Banter, DO  Relative Name and Phone Number:  Gala Murdoch [phone]    Current Level of Care: Hospital Recommended Level of Care: Skilled Nursing Facility Prior Approval Number:    Date Approved/Denied:   PASRR Number:    Discharge Plan: SNF    Current Diagnoses: Patient Active Problem List   Diagnosis Date Noted  . Acute embolic stroke (HCC) 03/02/2020  . Goals of care, counseling/discussion   . Palliative care by specialist   . DNR (do not resuscitate) discussion   . Stroke (HCC) 03/01/2020  . HLD (hyperlipidemia) 03/01/2020  . Atrial fibrillation (HCC) 03/01/2020  . CKD (chronic kidney disease), stage IIIa 03/01/2020  . Depression 03/01/2020  . Hyponatremia 03/01/2020  . Hyperkalemia 03/01/2020  . Left hemiplegia (HCC) 12/05/2019  . Acute right MCA stroke (HCC) 11/05/2019  . Acute cerebral infarction (HCC)   . Stage 3 chronic kidney disease   . Chronic constipation   . Chronic diastolic congestive heart failure (HCC)   . Essential hypertension   . Dysphagia, post-stroke     Orientation RESPIRATION BLADDER Height & Weight     Self, Time, Situation, Place  Normal Incontinent Weight: 48.3 kg Height:  5\' 6"  (167.6 cm)  BEHAVIORAL SYMPTOMS/MOOD NEUROLOGICAL BOWEL NUTRITION STATUS      Incontinent Diet (Dysphagia 1- send gravy with all meals- thin liquids)  AMBULATORY STATUS COMMUNICATION OF NEEDS Skin   Extensive Assist Verbally Normal                       Personal Care Assistance Level of Assistance  Bathing, Feeding, Dressing Bathing  Assistance: Maximum assistance Feeding assistance: Limited assistance Dressing Assistance: Maximum assistance     Functional Limitations Info  Sight, Hearing Sight Info: Impaired Hearing Info: Impaired      SPECIAL CARE FACTORS FREQUENCY  PT (By licensed PT), OT (By licensed OT)     PT Frequency: 5 times per week OT Frequency: 5 times per week            Contractures Contractures Info: Not present    Additional Factors Info  Code Status, Allergies Code Status Info: Full Allergies Info: Sulfa, Doxycycline           Current Medications (03/03/2020):  This is the current hospital active medication list Current Facility-Administered Medications  Medication Dose Route Frequency Provider Last Rate Last Admin  . acetaminophen (TYLENOL) tablet 650 mg  650 mg Oral Q6H PRN 05/04/2020, MD      . aspirin EC tablet 81 mg  81 mg Oral Daily Lorretta Harp, MD   81 mg at 03/03/20 0917  . calcium-vitamin D (OSCAL WITH D) 500-200 MG-UNIT per tablet 1 tablet  1 tablet Oral Q breakfast 05/04/20, MD   1 tablet at 03/03/20 0919  . clopidogrel (PLAVIX) tablet 75 mg  75 mg Oral Daily 05/04/20, MD   75 mg at 03/03/20 0919  . enoxaparin (LOVENOX) injection 40 mg  40 mg Subcutaneous Q24H 05/04/20, MD   40 mg at 03/02/20 2131  . hydrALAZINE (APRESOLINE) injection 5 mg  5  mg Intravenous Q2H PRN Lorretta Harp, MD      . hydrALAZINE (APRESOLINE) tablet 10 mg  10 mg Oral BID Lorretta Harp, MD   10 mg at 03/03/20 0919  . loratadine (CLARITIN) tablet 10 mg  10 mg Oral Daily Lorretta Harp, MD   10 mg at 03/03/20 0917  . metoprolol succinate (TOPROL-XL) 24 hr tablet 50 mg  50 mg Oral Daily Lorretta Harp, MD   50 mg at 03/03/20 0917  . ondansetron (ZOFRAN) injection 4 mg  4 mg Intravenous Q8H PRN Lorretta Harp, MD      . pantoprazole (PROTONIX) EC tablet 40 mg  40 mg Oral Daily Lorretta Harp, MD   40 mg at 03/03/20 0919  . pravastatin (PRAVACHOL) tablet 40 mg  40 mg Oral QPC supper Lorretta Harp, MD   40 mg at 03/01/20 2223   . senna-docusate (Senokot-S) tablet 1 tablet  1 tablet Oral QHS PRN Lorretta Harp, MD      . vitamin B-12 (CYANOCOBALAMIN) tablet 1,000 mcg  1,000 mcg Oral Daily Lorretta Harp, MD   1,000 mcg at 03/03/20 6578     Discharge Medications: Please see discharge summary for a list of discharge medications.  Relevant Imaging Results:  Relevant Lab Results:   Additional Information SS# [id number]  Allayne Butcher, RN

## 2020-03-04 ENCOUNTER — Encounter: Payer: Medicare Other | Admitting: Physical Medicine & Rehabilitation

## 2020-03-04 DIAGNOSIS — I480 Paroxysmal atrial fibrillation: Secondary | ICD-10-CM

## 2020-03-04 DIAGNOSIS — I10 Essential (primary) hypertension: Secondary | ICD-10-CM

## 2020-03-04 DIAGNOSIS — F329 Major depressive disorder, single episode, unspecified: Secondary | ICD-10-CM

## 2020-03-04 LAB — BASIC METABOLIC PANEL
Anion gap: 10 (ref 5–15)
BUN: 11 mg/dL (ref 8–23)
CO2: 25 mmol/L (ref 22–32)
Calcium: 8.9 mg/dL (ref 8.9–10.3)
Chloride: 100 mmol/L (ref 98–111)
Creatinine, Ser: 0.92 mg/dL (ref 0.44–1.00)
GFR calc Af Amer: >60 mL/min (ref >60)
GFR calc non Af Amer: 54 mL/min — ABNORMAL LOW (ref >60)
Glucose, Bld: 79 mg/dL (ref 70–99)
Potassium: 4.4 mmol/L (ref 3.5–5.1)
Sodium: 135 mmol/L (ref 135–145)

## 2020-03-04 LAB — URINE CULTURE: Culture: >=100000 — AB

## 2020-03-04 LAB — MAGNESIUM: Magnesium: 2.4 mg/dL (ref 1.7–2.4)

## 2020-03-04 MED ORDER — APIXABAN 2.5 MG PO TABS: 2.5000 mg | ORAL_TABLET | Freq: Two times a day (BID) | ORAL | 0 refills | Status: AC

## 2020-03-04 NOTE — TOC Transition Note (Signed)
Transition of Care [facility]) - CM/SW Discharge Note   Patient Details  Name: Sarah Delacruz MRN: [id number] Date of Birth: 1924/08/25  Transition of Care [facility]) CM/SW Contact:  Allayne Butcher, RN Phone Number: 03/04/2020, 1:53 PM   Clinical Narrative:    Patient is medically stable for discharge.  Patient is discharging today to [facility].  Patient will discharge via [facility], EMS has been set up by this RNCM.  Patient is 5th on the list for pick up. Family has been updated on discharge plans.    Final next level of care: Skilled Nursing Facility Barriers to Discharge: Barriers Resolved   Patient Goals and CMS Choice Patient states their goals for this hospitalization and ongoing recovery are:: Daughter would like rehab at [facility] or for patient to come back home with home health CMS Medicare.gov Compare Post Acute Care list provided to:: Patient Represenative (must comment) Choice offered to / list presented to : Adult Children  Discharge Placement              Patient chooses bed at: [facility] Patient to be transferred to facility by: [facility] Name of family member notified: Sedalia Muta and Stage manager Patient and family notified of of transfer: 03/04/20  Discharge Plan and Services   Discharge Planning Services: CM Consult Post Acute Care Choice: Home Health                    HH Arranged: RN, PT, OT, Nurse's Aide, Social Work [facility] Agency: [facility] Date HH Agency Contacted: 03/03/20 Time HH Agency Contacted: 1332 Representative spoke with at [facility] Agency: Christy Gentles  Social Determinants of Health (SDOH) Interventions     Readmission Risk Interventions No flowsheet data found.

## 2020-03-04 NOTE — Discharge Summary (Signed)
5        [place] [facility]   PATIENT NAME: Sarah Delacruz    MR#:  [id number]  DATE OF BIRTH:  1924/06/25  DATE OF ADMISSION:  03/01/2020   ADMITTING PHYSICIAN: Pennie Banter, DO  DATE OF DISCHARGE: 03/04/2020  PRIMARY CARE PHYSICIAN: Barbette Reichmann, MD   ADMISSION DIAGNOSIS:  Stroke [facility]) [I63.9] Cerebrovascular accident (CVA), unspecified mechanism (HCC) [I63.9] Acute embolic stroke (HCC) [I63.9] DISCHARGE DIAGNOSIS:  Principal Problem:   Stroke [facility]) Active Problems:   Chronic diastolic congestive heart failure (HCC)   Essential hypertension   HLD (hyperlipidemia)   Atrial fibrillation (HCC)   CKD (chronic kidney disease), stage IIIa   Depression   Hyponatremia   Hyperkalemia   Goals of care, counseling/discussion   Palliative care by specialist   DNR (do not resuscitate) discussion   Acute embolic stroke (HCC)  SECONDARY DIAGNOSIS:   Past Medical History:  Diagnosis Date  . Anemia   . Bundle branch block, left   . Chronic diastolic CHF (congestive heart failure) (HCC)   . CKD (chronic kidney disease), stage III   . GERD (gastroesophageal reflux disease)   . Hyperlipidemia   . Hypertension   . Pruritus   . Stroke (HCC)   . Valvular heart disease    HOSPITAL COURSE:  86 year old female with a known history of hypertension, hyperlipidemia, age 70, diastolic heart failure, left bundle branch block, paroxysmal A. fib admitted for acute stroke  Acute embolic CVA -Neurology and cardiology in agreement to start Eliquis considering atrial fibrillation Patient had infarct in March 2021 when she underwent TPA and thrombectomy with residual left-sided weakness Patient seem to be at her baseline per family at this time.  She is wheelchair-bound for the most part MRI shows small acute infarct in the right IC and temporoparietal junction Echo showed EF of 60 to 65%, grade 1 diastolic dysfunction, severe pulmonary hypertension Neurology recommends  Eliquis and statin PT/OT/speech therapy recommends SNF where she is being discharged.  She has a bed at [facility].  Family is in agreement.  I have discussed with both daughters over phone and they are in agreement with discharge planning.  New onset atrial fibrillation Seen by cardiology, CHA2DS2-VASc score is 7, recommends Eliquis for full dose anticoagulation Metoprolol for rate control  Hyponatremia Resolved with hydration  Hyperkalemia Present on admission and now resolved status post Kayexalate    CKD stage IIIa -at baseline DISCHARGE CONDITIONS:  Fair CONSULTS OBTAINED:  Treatment Team:  Kym Groom, MD DRUG ALLERGIES:   Allergies  Allergen Reactions  . Sulfa Antibiotics   . Doxycycline Nausea Only   DISCHARGE MEDICATIONS:   Allergies as of 03/04/2020      Reactions   Sulfa Antibiotics    Doxycycline Nausea Only      Medication List    STOP taking these medications   aspirin 325 MG tablet     TAKE these medications   acetaminophen 325 MG tablet Commonly known as: TYLENOL Take 650 mg by mouth every 6 (six) hours as needed for fever.   apixaban 2.5 MG Tabs tablet Commonly known as: ELIQUIS Take 1 tablet (2.5 mg total) by mouth 2 (two) times daily.   calcium-vitamin D 500-200 MG-UNIT tablet Commonly known as: OSCAL WITH D Take 1 tablet by mouth.   hydrALAZINE 10 MG tablet Commonly known as: APRESOLINE Take 1 tablet (10 mg total) by mouth every 8 (eight) hours. What changed: when to take this   loratadine 10  MG tablet Commonly known as: CLARITIN Take 1 tablet (10 mg total) by mouth daily.   metoprolol succinate 50 MG 24 hr tablet Commonly known as: TOPROL-XL Take 50 mg by mouth daily. Take with or immediately following a meal.   omeprazole 20 MG capsule Commonly known as: PRILOSEC Take 20 mg by mouth daily as needed (acid reflux).   pravastatin 40 MG tablet Commonly known as: PRAVACHOL Take 1 tablet (40 mg total) by mouth daily  after supper.   vitamin B-12 1000 MCG tablet Commonly known as: CYANOCOBALAMIN Take 1,000 mcg by mouth daily.      DISCHARGE INSTRUCTIONS:   DIET:  Low fat, Low cholesterol diet DISCHARGE CONDITION:  Fair ACTIVITY:  Activity as tolerated OXYGEN:  Home Oxygen: No.  Oxygen Delivery: room air DISCHARGE LOCATION:  nursing home -palliative care to follow  If you experience worsening of your admission symptoms, develop shortness of breath, life threatening emergency, suicidal or homicidal thoughts you must seek medical attention immediately by calling 911 or calling your MD immediately  if symptoms less severe.  You Must read complete instructions/literature along with all the possible adverse reactions/side effects for all the Medicines you take and that have been prescribed to you. Take any new Medicines after you have completely understood and accpet all the possible adverse reactions/side effects.   Please note  You were cared for by a hospitalist during your hospital stay. If you have any questions about your discharge medications or the care you received while you were in the hospital after you are discharged, you can call the unit and asked to speak with the hospitalist on call if the hospitalist that took care of you is not available. Once you are discharged, your primary care physician will handle any further medical issues. Please note that NO REFILLS for any discharge medications will be authorized once you are discharged, as it is imperative that you return to your primary care physician (or establish a relationship with a primary care physician if you do not have one) for your aftercare needs so that they can reassess your need for medications and monitor your lab values.    On the day of Discharge:  VITAL SIGNS:  Blood pressure (!) 150/44, pulse 68, temperature 97.8 F (36.6 C), temperature source Oral, resp. rate 20, height 5\' 6"  (1.676 m), weight 48.3 kg, SpO2 100  %. PHYSICAL EXAMINATION:  GENERAL:  86 y.o.-year-old patient lying in the bed with no acute distress.  EYES: Pupils equal, round, reactive to light and accommodation. No scleral icterus. Extraocular muscles intact.  HEENT: Head atraumatic, normocephalic. Oropharynx and nasopharynx clear.  NECK:  Supple, no jugular venous distention. No thyroid enlargement, no tenderness.  LUNGS: Normal breath sounds bilaterally, no wheezing, rales,rhonchi or crepitation. No use of accessory muscles of respiration.  CARDIOVASCULAR: S1, S2 normal. No murmurs, rubs, or gallops.  ABDOMEN: Soft, non-tender, non-distended. Bowel sounds present. No organomegaly or mass.  EXTREMITIES: No pedal edema, cyanosis, or clubbing.  NEUROLOGIC: Cranial nerves II through XII are intact. Muscle strength 5/5 in all extremities. Sensation intact. Gait not checked.  PSYCHIATRIC: The patient is alert and oriented x 3.  SKIN: No obvious rash, lesion, or ulcer.  DATA REVIEW:   CBC Recent Labs  Lab 03/01/20 0938  WBC 8.1  HGB 11.8*  HCT 35.3*  PLT 466*    Chemistries  Recent Labs  Lab 03/01/20 1239 03/01/20 1914 03/04/20 0518  NA 126*   < > 135  K 5.4*   < >  4.4  CL 91*   < > 100  CO2 24   < > 25  GLUCOSE 104*   < > 79  BUN 13   < > 11  CREATININE 0.88   < > 0.92  CALCIUM 9.0   < > 8.9  MG  --    < > 2.4  AST 19  --   --   ALT 10  --   --   ALKPHOS 55  --   --   BILITOT 0.7  --   --    < > = values in this interval not displayed.     Outpatient follow-up  Contact information for follow-up providers    Barbette Reichmann, MD. Schedule an appointment as soon as possible for a visit in 1 week(s).   Specialty: Internal Medicine Contact information: [address] [place] [address] [phone]        Alwyn Pea, MD. Schedule an appointment as soon as possible for a visit in 2 week(s).   Specialties: Cardiology, Internal Medicine Contact information: [address] [place] [address] [phone]        Lonell Face, MD. Schedule an appointment as soon as possible for a visit in 3 week(s).   Specialty: Neurology Contact information: [phone] [facility] [address] [facility] [place] [address] [phone]            Contact information for after-discharge care    Destination    [facility] .   Service: Skilled Nursing Contact information: [address] [place] [address] [phone]                   Management plans discussed with the patient, family and they are in agreement.  CODE STATUS: DNR   TOTAL TIME TAKING CARE OF THIS PATIENT: 45 minutes.    Delfino Lovett M.D on 03/04/2020 at 12:48 PM  [facility]   CC: Primary care physician; Barbette Reichmann, MD   Note: This dictation was prepared with Dragon dictation along with smaller phrase technology. Any transcriptional errors that result from this process are unintentional.

## 2020-03-04 NOTE — TOC Progression Note (Signed)
Transition of Care [facility]) - Progression Note    Patient Details  Name: Sarah Delacruz MRN: [id number] Date of Birth: Mar 10, 1924  [facility]) CM/SW Contact  Allayne Butcher, RN Phone Number: 03/04/2020, 11:15 AM  Clinical Narrative:    [facility] has accepted patient for SNF.  [name].  Patient has not had her COVID vaccine so she will need to be in quarantine for 14 days at the facility. Both daughter's have been updated on discharge plan.    Expected Discharge Plan: Home w Home Health Services Barriers to Discharge: Continued Medical Work up  Expected Discharge Plan and Services Expected Discharge Plan: Home w Home Health Services   Discharge Planning Services: CM Consult Post Acute Care Choice: Home Health Living arrangements for the past 2 months: Single Family Home                           HH Arranged: RN, PT, OT, Nurse's Aide, Social Work [facility] Agency: [facility] Date HH Agency Contacted: 03/03/20 Time HH Agency Contacted: 1332 Representative spoke with at [facility] Agency: Christy Gentles   Social Determinants of Health (SDOH) Interventions    Readmission Risk Interventions No flowsheet data found.

## 2020-03-04 NOTE — Progress Notes (Signed)
Palliative: Sarah Delacruz is resting quietly in bed.  She wakes easily when I enter.  She is alert and oriented, calm and cooperative.  She is able to make her basic needs known.  There is no family at bedside at this time.  Sarah Delacruz has experienced another stroke, and will be going for short-term rehab.  She is hopeful to return to her own home with her daughters Sarah Delacruz and Sarah Delacruz as caregivers.  She was made DNR this admit.  [place] form (DNR) completed.  Conference with attending, bedside nursing staff, transition of care team related to patient condition, needs, goals of care.  Plan: Short-term rehab.  Now DNR.  Ultimate goal is to return to her her own home with her live-in daughters, Sarah Delacruz and Sarah Delacruz as caregivers.  15 minutes  Palliative medicine team Team phome [phone] Greater than 50% of this time was spent counseling and coordinating care related to the above assessment and plan.

## 2020-03-04 NOTE — Progress Notes (Signed)
[facility] Cardiology    SUBJECTIVE: Patient states to feel reasonably well sitting up in bed eating denies any palpitations tachycardia still has significant dense left hemiparesis no bleeding no chest pain   Vitals:   03/03/20 1918 03/04/20 0037 03/04/20 0411 03/04/20 0728  BP: 108/86 (!) 153/57 (!) 161/48 (!) 143/91  Pulse: 69 68 (!) 57 65  Resp:  16 16 20   Temp: 97.6 F (36.4 C) 97.9 F (36.6 C) 98.4 F (36.9 C) 97.6 F (36.4 C)  TempSrc: Oral   Oral  SpO2: 99% 100% 97% 100%  Weight:      Height:         Intake/Output Summary (Last 24 hours) at 03/04/2020 0932 Last data filed at 03/04/2020 0602 Gross per 24 hour  Intake 2125 ml  Output 1000 ml  Net 1125 ml      PHYSICAL EXAM  General: Well developed, well nourished, in no acute distress HEENT:  Normocephalic and atramatic Neck:  No JVD.  Lungs: Clear bilaterally to auscultation and percussion. Heart: HRRR . Normal S1 and S2 without gallops or murmurs.  Abdomen: Bowel sounds are positive, abdomen soft and non-tender  Msk:  Back normal, normal gait. Normal strength and tone for age. Extremities: No clubbing, cyanosis or edema.   Neuro: Alert and oriented X 3. Psych:  Good affect, responds appropriately   LABS: Basic Metabolic Panel: Recent Labs    03/03/20 0546 03/04/20 0518  NA 136 135  K 3.2* 4.4  CL 102 100  CO2 22 25  GLUCOSE 82 79  BUN 12 11  CREATININE 0.81 0.92  CALCIUM 8.5* 8.9  MG 1.6* 2.4   Liver Function Tests: Recent Labs    03/01/20 1239  AST 19  ALT 10  ALKPHOS 55  BILITOT 0.7  PROT 7.3  ALBUMIN 3.2*   No results for input(s): LIPASE, AMYLASE in the last 72 hours. CBC: Recent Labs    03/01/20 0938  WBC 8.1  NEUTROABS 4.4  HGB 11.8*  HCT 35.3*  MCV 86.7  PLT 466*   Cardiac Enzymes: No results for input(s): CKTOTAL, CKMB, CKMBINDEX, TROPONINI in the last 72 hours. BNP: Invalid input(s): POCBNP D-Dimer: No results for input(s): DDIMER in the last 72 hours. Hemoglobin  A1C: Recent Labs    03/02/20 0219  HGBA1C 6.1*   Fasting Lipid Panel: Recent Labs    03/02/20 0219  CHOL 133  HDL 51  LDLCALC 72  TRIG 51  CHOLHDL 2.6   Thyroid Function Tests: Recent Labs    03/02/20 0219  TSH 2.728   Anemia Panel: No results for input(s): VITAMINB12, FOLATE, FERRITIN, TIBC, IRON, RETICCTPCT in the last 72 hours.  ECHOCARDIOGRAM COMPLETE  Result Date: 03/03/2020    ECHOCARDIOGRAM REPORT   Patient Name:   Sarah Delacruz Date of Exam: 03/02/2020 Medical Rec #:  05/03/2020     Height:       66.0 in Accession #:    [id number]    Weight:       106.5 lb Date of Birth:  1923/09/03    BSA:          1.530 m Patient Age:    86 years      BP:           124/70 mmHg Patient Gender: F             HR:           82 bpm. Exam Location:  [facility] Procedure: 2D Echo, Cardiac Doppler and  Color Doppler Indications:     I163.9 Stroke  History:         Patient has no prior history of Echocardiogram examinations.                  Risk Factors:Hypertension and Dyslipidemia. Stroke. Chronic                  kidney disease. LBBB. Chronic diastolic heart failure.  Sonographer:     Sedonia Small Rodgers-Jones Referring Phys:  [id number] ZMCEY NIU Diagnosing Phys: Harold Hedge MD IMPRESSIONS  1. Left ventricular ejection fraction, by estimation, is 60 to 65%. The left ventricle has normal function. The left ventricle has no regional wall motion abnormalities. There is severe concentric left ventricular hypertrophy. Left ventricular diastolic  parameters are consistent with Grade I diastolic dysfunction (impaired relaxation).  2. Right ventricular systolic function is normal. The right ventricular size is normal. There is severely elevated pulmonary artery systolic pressure.  3. Left atrial size was mild to moderately dilated.  4. Right atrial size was mildly dilated.  5. The mitral valve is grossly normal. Moderate mitral valve regurgitation.  6. The aortic valve was not well visualized. Aortic valve regurgitation is  mild. FINDINGS  Left Ventricle: Left ventricular ejection fraction, by estimation, is 60 to 65%. The left ventricle has normal function. The left ventricle has no regional wall motion abnormalities. The left ventricular internal cavity size was normal in size. There is  severe concentric left ventricular hypertrophy. Left ventricular diastolic parameters are consistent with Grade I diastolic dysfunction (impaired relaxation). Right Ventricle: The right ventricular size is normal. No increase in right ventricular wall thickness. Right ventricular systolic function is normal. There is severely elevated pulmonary artery systolic pressure. The tricuspid regurgitant velocity is 3.69 m/s, and with an assumed right atrial pressure of 10 mmHg, the estimated right ventricular systolic pressure is 64.5 mmHg. Left Atrium: Left atrial size was mild to moderately dilated. Right Atrium: Right atrial size was mildly dilated. Pericardium: There is no evidence of pericardial effusion. Mitral Valve: The mitral valve is grossly normal. Moderate mitral valve regurgitation. Tricuspid Valve: The tricuspid valve is not well visualized. Tricuspid valve regurgitation is trivial. Aortic Valve: The aortic valve was not well visualized. Aortic valve regurgitation is mild. Aortic regurgitation PHT measures 340 msec. Pulmonic Valve: The pulmonic valve was not well visualized. Pulmonic valve regurgitation is trivial. Aorta: The aortic root is normal in size and structure. IAS/Shunts: The interatrial septum was not assessed.  LEFT VENTRICLE PLAX 2D LVIDd:         3.27 cm  Diastology LVIDs:         2.12 cm  LV e' lateral:   4.43 cm/s LV PW:         1.15 cm  LV E/e' lateral: 24.2 LV IVS:        1.29 cm  LV e' medial:    4.43 cm/s LVOT diam:     1.50 cm  LV E/e' medial:  24.2 LVOT Area:     1.77 cm  RIGHT VENTRICLE             IVC RV Basal diam:  3.39 cm     IVC diam: 1.37 cm RV S prime:     14.00 cm/s TAPSE (M-mode): 2.0 cm LEFT ATRIUM              Index       RIGHT ATRIUM  Index LA diam:        4.40 cm 2.88 cm/m  RA Area:     11.40 cm LA Vol (A2C):   62.1 ml 40.59 ml/m RA Volume:   25.80 ml  16.86 ml/m LA Vol (A4C):   78.6 ml 51.38 ml/m LA Biplane Vol: 70.8 ml 46.28 ml/m  AORTIC VALVE AI PHT:      340 msec  AORTA Ao Root diam: 2.60 cm MITRAL VALVE                TRICUSPID VALVE MV Area (PHT): 2.37 cm     TR Peak grad:   54.5 mmHg MV Decel Time: 320 msec     TR Vmax:        369.00 cm/s MV E velocity: 107.00 cm/s MV A velocity: 128.00 cm/s  SHUNTS MV E/A ratio:  0.84         Systemic Diam: 1.50 cm Harold Hedge MD Electronically signed by Harold Hedge MD Signature Date/Time: 03/03/2020/7:32:39 AM    Final      Echo preserved left ventricular function ejection fraction of 60%  TELEMETRY: Normal sinus rhythm nonspecific ST-T wave changes  ASSESSMENT AND PLAN:  Principal Problem:   Stroke [facility]) Active Problems:   Chronic diastolic congestive heart failure (HCC)   Essential hypertension   HLD (hyperlipidemia)   Atrial fibrillation (HCC)   CKD (chronic kidney disease), stage IIIa   Depression   Hyponatremia   Hyperkalemia   Goals of care, counseling/discussion   Palliative care by specialist   DNR (do not resuscitate) discussion   Acute embolic stroke (HCC)    Plan Status post CVA with left dense hemiparesis from CVA probably related to A. fib recommend anticoagulation with Eliquis long-term Hypertension control recommend continue medical therapy Chronic renal insufficiency continue to follow-up with nephrology Recommend statin therapy for hyperlipidemia Recommend therapy physical occupational speech Have the patient follow-up with cardiology 1 to 2 weeks after discharge for paroxysmal atrial fibrillation   Alwyn Pea, MD 03/04/2020 9:32 AM

## 2020-03-04 NOTE — Progress Notes (Signed)
IV removed before discharge. Patient will be going to [facility]. Report called to Marylene Land at [facility]. Patient transporting per EMS.

## 2020-03-16 ENCOUNTER — Encounter: Payer: Medicare Other | Admitting: Physical Medicine & Rehabilitation

## 2020-03-21 ENCOUNTER — Inpatient Hospital Stay
Admission: EM | Admit: 2020-03-21 | Discharge: 2020-03-23 | DRG: 309 | Disposition: A | Discharge status: Skilled Nursing Facility | Payer: Medicare Other | Source: Skilled Nursing Facility | Attending: Internal Medicine | Admitting: Internal Medicine

## 2020-03-21 ENCOUNTER — Emergency Department: Payer: Medicare Other

## 2020-03-21 ENCOUNTER — Other Ambulatory Visit: Payer: Self-pay

## 2020-03-21 DIAGNOSIS — I248 Other forms of acute ischemic heart disease: Secondary | ICD-10-CM | POA: Diagnosis present

## 2020-03-21 DIAGNOSIS — I5032 Chronic diastolic (congestive) heart failure: Secondary | ICD-10-CM | POA: Diagnosis present

## 2020-03-21 DIAGNOSIS — R636 Underweight: Secondary | ICD-10-CM | POA: Diagnosis present

## 2020-03-21 DIAGNOSIS — Z7901 Long term (current) use of anticoagulants: Secondary | ICD-10-CM

## 2020-03-21 DIAGNOSIS — E785 Hyperlipidemia, unspecified: Secondary | ICD-10-CM | POA: Diagnosis present

## 2020-03-21 DIAGNOSIS — K219 Gastro-esophageal reflux disease without esophagitis: Secondary | ICD-10-CM | POA: Diagnosis present

## 2020-03-21 DIAGNOSIS — I214 Non-ST elevation (NSTEMI) myocardial infarction: Secondary | ICD-10-CM | POA: Diagnosis not present

## 2020-03-21 DIAGNOSIS — I9589 Other hypotension: Secondary | ICD-10-CM

## 2020-03-21 DIAGNOSIS — N179 Acute kidney failure, unspecified: Secondary | ICD-10-CM | POA: Diagnosis present

## 2020-03-21 DIAGNOSIS — Z79899 Other long term (current) drug therapy: Secondary | ICD-10-CM | POA: Diagnosis not present

## 2020-03-21 DIAGNOSIS — I447 Left bundle-branch block, unspecified: Secondary | ICD-10-CM | POA: Diagnosis present

## 2020-03-21 DIAGNOSIS — Z7189 Other specified counseling: Secondary | ICD-10-CM

## 2020-03-21 DIAGNOSIS — Z8249 Family history of ischemic heart disease and other diseases of the circulatory system: Secondary | ICD-10-CM

## 2020-03-21 DIAGNOSIS — Z20822 Contact with and (suspected) exposure to covid-19: Secondary | ICD-10-CM | POA: Diagnosis present

## 2020-03-21 DIAGNOSIS — I480 Paroxysmal atrial fibrillation: Principal | ICD-10-CM | POA: Diagnosis present

## 2020-03-21 DIAGNOSIS — I130 Hypertensive heart and chronic kidney disease with heart failure and stage 1 through stage 4 chronic kidney disease, or unspecified chronic kidney disease: Secondary | ICD-10-CM | POA: Diagnosis present

## 2020-03-21 DIAGNOSIS — I4891 Unspecified atrial fibrillation: Secondary | ICD-10-CM

## 2020-03-21 DIAGNOSIS — Z66 Do not resuscitate: Secondary | ICD-10-CM | POA: Diagnosis present

## 2020-03-21 DIAGNOSIS — Z681 Body mass index (BMI) 19 or less, adult: Secondary | ICD-10-CM | POA: Diagnosis not present

## 2020-03-21 DIAGNOSIS — I679 Cerebrovascular disease, unspecified: Secondary | ICD-10-CM

## 2020-03-21 DIAGNOSIS — N1831 Chronic kidney disease, stage 3a: Secondary | ICD-10-CM | POA: Diagnosis present

## 2020-03-21 DIAGNOSIS — E869 Volume depletion, unspecified: Secondary | ICD-10-CM | POA: Diagnosis present

## 2020-03-21 DIAGNOSIS — N183 Chronic kidney disease, stage 3 unspecified: Secondary | ICD-10-CM | POA: Diagnosis present

## 2020-03-21 DIAGNOSIS — Z8673 Personal history of transient ischemic attack (TIA), and cerebral infarction without residual deficits: Secondary | ICD-10-CM

## 2020-03-21 DIAGNOSIS — I959 Hypotension, unspecified: Secondary | ICD-10-CM | POA: Diagnosis present

## 2020-03-21 DIAGNOSIS — I69391 Dysphagia following cerebral infarction: Secondary | ICD-10-CM

## 2020-03-21 LAB — COMPREHENSIVE METABOLIC PANEL
ALT: 14 U/L (ref 0–44)
AST: 34 U/L (ref 15–41)
Albumin: 3.3 g/dL — ABNORMAL LOW (ref 3.5–5.0)
Alkaline Phosphatase: 53 U/L (ref 38–126)
Anion gap: 13 (ref 5–15)
BUN: 26 mg/dL — ABNORMAL HIGH (ref 8–23)
CO2: 25 mmol/L (ref 22–32)
Calcium: 9.4 mg/dL (ref 8.9–10.3)
Chloride: 98 mmol/L (ref 98–111)
Creatinine, Ser: 1.3 mg/dL — ABNORMAL HIGH (ref 0.44–1.00)
GFR calc Af Amer: 41 mL/min — ABNORMAL LOW (ref >60)
GFR calc non Af Amer: 35 mL/min — ABNORMAL LOW (ref >60)
Glucose, Bld: 128 mg/dL — ABNORMAL HIGH (ref 70–99)
Potassium: 4.5 mmol/L (ref 3.5–5.1)
Sodium: 136 mmol/L (ref 135–145)
Total Bilirubin: 1.2 mg/dL (ref 0.3–1.2)
Total Protein: 7.5 g/dL (ref 6.5–8.1)

## 2020-03-21 LAB — CBC
HCT: 37.5 % (ref 36.0–46.0)
Hemoglobin: 11.9 g/dL — ABNORMAL LOW (ref 12.0–15.0)
MCH: 29 pg (ref 26.0–34.0)
MCHC: 31.7 g/dL (ref 30.0–36.0)
MCV: 91.2 fL (ref 80.0–100.0)
Platelets: 553 10*3/uL — ABNORMAL HIGH (ref 150–400)
RBC: 4.11 MIL/uL (ref 3.87–5.11)
RDW: 14.1 % (ref 11.5–15.5)
WBC: 9.7 10*3/uL (ref 4.0–10.5)
nRBC: 0 % (ref 0.0–0.2)

## 2020-03-21 LAB — SARS CORONAVIRUS 2 BY RT PCR (HOSPITAL ORDER, PERFORMED IN ~~LOC~~ HOSPITAL LAB): SARS Coronavirus 2: NEGATIVE

## 2020-03-21 LAB — TROPONIN I (HIGH SENSITIVITY)
Troponin I (High Sensitivity): 693 ng/L (ref <18)
Troponin I (High Sensitivity): 409 ng/L (ref <18)
Troponin I (High Sensitivity): 496 ng/L (ref <18)

## 2020-03-21 LAB — PROTIME-INR
INR: 1.4 — ABNORMAL HIGH (ref 0.8–1.2)
Prothrombin Time: 17 seconds — ABNORMAL HIGH (ref 11.4–15.2)

## 2020-03-21 LAB — APTT: aPTT: 33 seconds (ref 24–36)

## 2020-03-21 MED ORDER — DILTIAZEM HCL 25 MG/5ML IV SOLN
10.0000 mg | Freq: Once | INTRAVENOUS | Status: AC
  Administered 2020-03-21: 6 mg via INTRAVENOUS
  Filled 2020-03-21: qty 5

## 2020-03-21 MED ORDER — DILTIAZEM HCL-DEXTROSE 125-5 MG/125ML-% IV SOLN (PREMIX)
5.0000 mg/h | INTRAVENOUS | Status: DC
  Filled 2020-03-21: qty 125

## 2020-03-21 MED ORDER — ACETAMINOPHEN 325 MG PO TABS: 650.0000 mg | ORAL_TABLET | Freq: Four times a day (QID) | ORAL | Status: DC | PRN

## 2020-03-21 MED ORDER — HEPARIN (PORCINE) 25000 UT/250ML-% IV SOLN: 500.0000 [IU]/h | INTRAVENOUS | Status: DC

## 2020-03-21 MED ORDER — AMIODARONE LOAD VIA INFUSION
150.0000 mg | Freq: Once | INTRAVENOUS | Status: AC
  Administered 2020-03-21: 150 mg via INTRAVENOUS
  Filled 2020-03-21: qty 83.34

## 2020-03-21 MED ORDER — PANTOPRAZOLE SODIUM 40 MG PO TBEC
40.0000 mg | DELAYED_RELEASE_TABLET | Freq: Every day | ORAL | Status: DC
  Administered 2020-03-22 – 2020-03-23 (×2): 40 mg via ORAL
  Filled 2020-03-21 (×2): qty 1

## 2020-03-21 MED ORDER — ACETAMINOPHEN 650 MG RE SUPP: 650.0000 mg | Freq: Four times a day (QID) | RECTAL | Status: DC | PRN

## 2020-03-21 MED ORDER — AMIODARONE HCL IN DEXTROSE 360-4.14 MG/200ML-% IV SOLN: 30.0000 mg/h | INTRAVENOUS | Status: DC

## 2020-03-21 MED ORDER — AMIODARONE HCL IN DEXTROSE 360-4.14 MG/200ML-% IV SOLN
60.0000 mg/h | INTRAVENOUS | Status: AC
  Administered 2020-03-21 (×2): 60 mg/h via INTRAVENOUS
  Filled 2020-03-21 (×2): qty 200

## 2020-03-21 MED ORDER — APIXABAN 2.5 MG PO TABS
2.5000 mg | ORAL_TABLET | Freq: Two times a day (BID) | ORAL | Status: DC
  Administered 2020-03-21 – 2020-03-23 (×4): 2.5 mg via ORAL
  Filled 2020-03-21 (×6): qty 1

## 2020-03-21 MED ORDER — SODIUM CHLORIDE 0.9 % IV BOLUS: 500.0000 mL | Freq: Once | INTRAVENOUS | Status: DC

## 2020-03-21 MED ORDER — SODIUM CHLORIDE 0.9 % IV BOLUS
2000.0000 mL | Freq: Once | INTRAVENOUS | Status: AC
  Administered 2020-03-21: 2000 mL via INTRAVENOUS

## 2020-03-21 MED ORDER — SODIUM CHLORIDE 0.9% FLUSH
3.0000 mL | Freq: Once | INTRAVENOUS | Status: AC
  Administered 2020-03-21: 3 mL via INTRAVENOUS

## 2020-03-21 MED ORDER — PRAVASTATIN SODIUM 40 MG PO TABS
40.0000 mg | ORAL_TABLET | Freq: Every day | ORAL | Status: DC
  Administered 2020-03-21 – 2020-03-22 (×2): 40 mg via ORAL
  Filled 2020-03-21 (×3): qty 1

## 2020-03-21 MED ORDER — HEPARIN BOLUS VIA INFUSION: 2600.0000 [IU] | Freq: Once | INTRAVENOUS | Status: DC

## 2020-03-21 MED ORDER — SODIUM CHLORIDE 0.9 % IV BOLUS
500.0000 mL | Freq: Once | INTRAVENOUS | Status: AC
  Administered 2020-03-21: 500 mL via INTRAVENOUS

## 2020-03-21 MED ORDER — SODIUM CHLORIDE 0.9 % IV BOLUS
1000.0000 mL | Freq: Once | INTRAVENOUS | Status: AC
  Administered 2020-03-21: 1000 mL via INTRAVENOUS

## 2020-03-21 NOTE — Progress Notes (Signed)
ANTICOAGULATION CONSULT NOTE - Initial Consult  Pharmacy Consult for Heparin Indication: chest pain/ACS  Allergies  Allergen Reactions  . Sulfa Antibiotics   . Doxycycline Nausea Only    Patient Measurements: Weight: (!) 43.4 kg (95 lb 9.6 oz) Heparin Dosing Weight: 43.4 kg  Vital Signs: Temp: 98 F (36.7 C) (07/25 0848) Temp Source: Rectal (07/25 0848) BP: 112/98 (07/25 1031) Pulse Rate: 147 (07/25 0829)  Labs: Recent Labs    03/21/20 0841  HGB 11.9*  HCT 37.5  PLT 553*  CREATININE 1.30*  TROPONINIHS 693*    Estimated Creatinine Clearance: 18.5 mL/min (A) (by C-G formula based on SCr of 1.3 mg/dL (H)).   Medical History: Past Medical History:  Diagnosis Date  . Anemia   . Bundle branch block, left   . Chronic diastolic CHF (congestive heart failure) (HCC)   . CKD (chronic kidney disease), stage III   . GERD (gastroesophageal reflux disease)   . Hyperlipidemia   . Hypertension   . Pruritus   . Stroke (HCC)   . Valvular heart disease     Assessment: 86 yo female on apixaban PTA starting on heparin drip for ACS. Pt on apixaban PTA with unknown timing of last dose. MAR from facility unavailable, pt and daughter unable to state when last dose; called facility but unable to get through to nursing unit to obtain last dose information. Spoke with ED MD who stated ok to start heparin with bolus and infusion as per usual.   Goal of Therapy:  Heparin level 0.3-0.7 units/ml aPTT 66-102 seconds Monitor platelets by anticoagulation protocol: Yes   Plan:  Add on baseline heparin level Heparin bolus 2600 units IV x1 then heparin drip 500 units/hr (=5 ml/hr) APTT in 8h after start of infusion - will adjust per aPTTs until correlation with HLs HL and CBC with AM labs in morning   Pharmacy will continue to follow.   Marty Heck 03/21/2020,11:43 AM

## 2020-03-21 NOTE — ED Notes (Signed)
Pt daughter is at the bedside.

## 2020-03-21 NOTE — H&P (Addendum)
History and Physical:    Sarah Delacruz   UVO:[id number] DOB: 09/17/1923 DOA: 03/21/2020 Referring MD/provider: Dr. Cyril Loosen PCP: Barbette Reichmann, MD   Patient coming from: SNF  Chief Complaint: Weakness  History of Present Illness:   Sarah Delacruz is an 86 y.o. female with PMH significant for multiple strokes and cerebrovascular disease, diastolic heart failure, hypertension, CKD 3 and known atrial fibrillation who was in her usual state of health until she was noted to be somewhat weaker than usual by nursing home staff as well as her daughter.  Patient was brought to the ED where she was noted to have A. fib with RVR at 140.  Patient herself admits to ongoing weakness.  But denies any chest pain per se.  She denies palpitations.  She is denies recent fevers or chills.  Denies shortness of breath.   ED Course:  The patient was noted to have heart rate in the 140s.  She was given 6 mg of Cardizem however dropped her SBP to the mid 80s.  She was given 1500 cc bolus normal saline without improvement in blood pressure.  Dr. Lady Gary was consulted who recommended starting amiodarone drip and continuing with IV fluid resuscitation aggressively.  Of note patient had no urine in the bladder when she was catheterized.  Patient is confirmed to be DNR.  ROS:   ROS   Review of Systems: Per HPI, patient is too ill to give a complete review of systems.   Past Medical History:   Past Medical History:  Diagnosis Date  . Anemia   . Bundle branch block, left   . Chronic diastolic CHF (congestive heart failure) (HCC)   . CKD (chronic kidney disease), stage III   . GERD (gastroesophageal reflux disease)   . Hyperlipidemia   . Hypertension   . Pruritus   . Stroke (HCC)   . Valvular heart disease     Past Surgical History:   History reviewed. No pertinent surgical history.  Social History:   Social History   Socioeconomic History  . Marital status: Legally Separated    Spouse name:  Not on file  . Number of children: Not on file  . Years of education: Not on file  . Highest education level: Not on file  Occupational History  . Not on file  Tobacco Use  . Smoking status: Never Smoker  . Smokeless tobacco: Never Used  Vaping Use  . Vaping Use: Never used  Substance and Sexual Activity  . Alcohol use: Not Currently  . Drug use: Never  . Sexual activity: Not Currently  Other Topics Concern  . Not on file  Social History Narrative  . Not on file   Social Determinants of Health   Financial Resource Strain:   . Difficulty of Paying Living Expenses:   Food Insecurity:   . Worried About Programme researcher, broadcasting/film/video in the Last Year:   . Barista in the Last Year:   Transportation Needs:   . Freight forwarder (Medical):   Marland Kitchen Lack of Transportation (Non-Medical):   Physical Activity:   . Days of Exercise per Week:   . Minutes of Exercise per Session:   Stress:   . Feeling of Stress :   Social Connections:   . Frequency of Communication with Friends and Family:   . Frequency of Social Gatherings with Friends and Family:   . Attends Religious Services:   . Active Member of Clubs or Organizations:   .  Attends Banker Meetings:   Marland Kitchen Marital Status:   Intimate Partner Violence:   . Fear of Current or Ex-Partner:   . Emotionally Abused:   Marland Kitchen Physically Abused:   . Sexually Abused:     Allergies   Sulfa antibiotics and Doxycycline  Family history:   Family History  Problem Relation Age of Onset  . Cancer Mother   . Heart disease Sister     Current Medications:   Prior to Admission medications   Medication Sig Start Date End Date Taking? Authorizing Provider  acetaminophen (TYLENOL) 325 MG tablet Take 650 mg by mouth every 6 (six) hours as needed for fever.    [provider]  apixaban (ELIQUIS) 2.5 MG TABS tablet Take 1 tablet (2.5 mg total) by mouth 2 (two) times daily. 03/04/20 04/03/20  Delfino Lovett, MD  calcium-vitamin D  (OSCAL WITH D) 500-200 MG-UNIT tablet Take 1 tablet by mouth.    [provider]  Cetirizine HCl 10 MG CAPS Take by mouth.    [provider]  hydrALAZINE (APRESOLINE) 10 MG tablet Take 1 tablet (10 mg total) by mouth every 8 (eight) hours. Patient taking differently: Take 10 mg by mouth in the morning and at bedtime.  12/05/19   Love, Evlyn Kanner, PA-C  ketoconazole (NIZORAL) 2 % cream Apply 1 application topically daily. 03/12/20   [provider]  loratadine (CLARITIN) 10 MG tablet Take 1 tablet (10 mg total) by mouth daily. 12/06/19   Love, Evlyn Kanner, PA-C  metoprolol succinate (TOPROL-XL) 50 MG 24 hr tablet Take 50 mg by mouth daily. Take with or immediately following a meal.    [provider]  omeprazole (PRILOSEC) 20 MG capsule Take 20 mg by mouth daily as needed (acid reflux).     [provider]  pravastatin (PRAVACHOL) 40 MG tablet Take 1 tablet (40 mg total) by mouth daily after supper. 12/05/19   Love, Evlyn Kanner, PA-C  vitamin B-12 (CYANOCOBALAMIN) 1000 MCG tablet Take 1,000 mcg by mouth daily.    [provider]    Physical Exam:   Vitals:   03/21/20 0951 03/21/20 1000 03/21/20 1020 03/21/20 1031  BP: (!) 81/66 (!) 79/68 (!) 89/71 (!) 112/98  Pulse:      Resp: 23 16 (!) 29 17  Temp:      TempSrc:      SpO2:      Weight:         Physical Exam: Blood pressure (!) 112/98, pulse (!) 147, temperature 98 F (36.7 C), temperature source Rectal, resp. rate 17, weight (!) 43.4 kg, SpO2 99 %. Gen: Frail elderly female lying in bed looking weak and tired. Eyes: sclera anicteric, conjuctiva mildly injected bilaterally CVS: Tachy, irregular  respiratory:  decreased air entry likely secondary to decreased inspiratory effort she does have rales at bases. GI: NABS, soft, NT  LE: No edema. No cyanosis Neuro: grossly nonfocal.    Data Review:    Labs: Basic Metabolic Panel: Recent Labs  Lab 03/21/20 0841  NA 136  K 4.5  CL 98    CO2 25  GLUCOSE 128*  BUN 26*  CREATININE 1.30*  CALCIUM 9.4   Liver Function Tests: Recent Labs  Lab 03/21/20 0841  AST 34  ALT 14  ALKPHOS 53  BILITOT 1.2  PROT 7.5  ALBUMIN 3.3*   No results for input(s): LIPASE, AMYLASE in the last 168 hours. No results for input(s): AMMONIA in the last 168 hours. CBC: Recent  Labs  Lab 03/21/20 0841  WBC 9.7  HGB 11.9*  HCT 37.5  MCV 91.2  PLT 553*   Cardiac Enzymes: No results for input(s): CKTOTAL, CKMB, CKMBINDEX, TROPONINI in the last 168 hours.  BNP (last 3 results) No results for input(s): PROBNP in the last 8760 hours. CBG: No results for input(s): GLUCAP in the last 168 hours.  Urinalysis    Component Value Date/Time   COLORURINE YELLOW (A) 03/01/2020 1020   APPEARANCEUR TURBID (A) 03/01/2020 1020   LABSPEC 1.004 (L) 03/01/2020 1020   PHURINE 5.0 03/01/2020 1020   GLUCOSEU NEGATIVE 03/01/2020 1020   HGBUR SMALL (A) 03/01/2020 1020   BILIRUBINUR NEGATIVE 03/01/2020 1020   KETONESUR NEGATIVE 03/01/2020 1020   PROTEINUR NEGATIVE 03/01/2020 1020   NITRITE NEGATIVE 03/01/2020 1020   LEUKOCYTESUR LARGE (A) 03/01/2020 1020      Radiographic Studies: DG Chest Portable 1 View  Result Date: 03/21/2020 CLINICAL DATA:  Weakness EXAM: PORTABLE CHEST 1 VIEW COMPARISON:  None. FINDINGS: Cardiomegaly. Both lungs are clear. The visualized skeletal structures are unremarkable. IMPRESSION: Cardiomegaly without acute abnormality of the lungs in AP portable projection. Electronically Signed   By: Lauralyn Primes M.D.   On: 03/21/2020 09:26    EKG: Has not yet been done, patient is on telemetry.  Previous EKG with LBBB.  Will order EKG.   Assessment/Plan:   Principal Problem:   Hypotension Active Problems:   Chronic diastolic congestive heart failure (HCC)   Cerebrovascular disease   Atrial fibrillation with RVR (HCC)   CKD (chronic kidney disease), stage IIIa   DNR (do not resuscitate) discussion  A. fib with RVR  complicated by hypotension RVR has been difficult to control due to ongoing hypotension after just receiving 6 mg of Cardizem Very much appreciate Dr. America Brown recommendations We will continue aggressive fluid resuscitation but will need to keep a close eye on lung exam as patient has known diastolic dysfunction however she was quite dry when she arrived as evidenced by no urine in bladder and AKI. Hold off on starting Cardizem drip with trial of amiodarone per Dr. Lady Gary Admit to stepdown Continue Eliquis per pharmacy consultation  Elevated troponin Patient with elevated troponin very likely secondary to demand ischemia No evidence of acute coronary syndrome or unstable clot Main therapeutic intervention would be to slow down her heart rate Echocardiogram can be ordered once she has been stabilized  Follow troponins every 6 hours  Acute kidney injury Most likely secondary to decreased flow to kidneys Will treat with fluid resuscitation and attempts at rate control  Cerebrovascular disease with multiple strokes including embolic strokes Continue Eliquis and pravastatin  HTN Hold hydralazine and metoprolol  Other information:   DVT prophylaxis: Patient is on Eliquis Code Status: DNR Family Communication: His daughter was at bedside Disposition Plan: TBD Consults called: Cardiology Admission status: Inpatient  Sarah Delacruz [facility]  If 7PM-7AM, please contact night-coverage [email or web address] Password [facility] 03/21/2020, 12:25 PM \

## 2020-03-21 NOTE — ED Notes (Signed)
Attempt to collect UA via I&O, no urine return noted. Second RN present and verify's placement.

## 2020-03-21 NOTE — ED Provider Notes (Signed)
[place] [facility] Emergency Department Provider Note   ____________________________________________    I have reviewed the triage vital signs and the nursing notes.   HISTORY  Chief Complaint Weakness     HPI Sarah Delacruz is a 86 y.o. female with a history of CKD, CHF, CVA, atrial fibrillation on Eliquis presents with reports of weakness, generalized.  Apparently per [facility] patient has been for weaker than typical for 2 to 3 days.  She does not complain of any pain but does feel woozy occasionally.  No reports of fevers or chills.  Patient does have a history of atrial fibrillation and noted to be tachycardic bilaterally comments.  No other history available at this time.  Past Medical History:  Diagnosis Date   Anemia    Bundle branch block, left    Chronic diastolic CHF (congestive heart failure) (HCC)    CKD (chronic kidney disease), stage III    GERD (gastroesophageal reflux disease)    Hyperlipidemia    Hypertension    Pruritus    Stroke [facility])    Valvular heart disease     Patient Active Problem List   Diagnosis Date Noted   Acute embolic stroke (HCC) 03/02/2020   Goals of care, counseling/discussion    Palliative care by specialist    DNR (do not resuscitate) discussion    Stroke (HCC) 03/01/2020   HLD (hyperlipidemia) 03/01/2020   Atrial fibrillation (HCC) 03/01/2020   CKD (chronic kidney disease), stage IIIa 03/01/2020   Depression 03/01/2020   Hyponatremia 03/01/2020   Hyperkalemia 03/01/2020   Left hemiplegia (HCC) 12/05/2019   Acute right MCA stroke (HCC) 11/05/2019   Acute cerebral infarction [facility])    Stage 3 chronic kidney disease    Chronic constipation    Chronic diastolic congestive heart failure (HCC)    Essential hypertension    Dysphagia, post-stroke     History reviewed. No pertinent surgical history.  Prior to Admission medications   Medication Sig Start Date End Date Taking?  Authorizing Provider  acetaminophen (TYLENOL) 325 MG tablet Take 650 mg by mouth every 6 (six) hours as needed for fever.    [provider]  apixaban (ELIQUIS) 2.5 MG TABS tablet Take 1 tablet (2.5 mg total) by mouth 2 (two) times daily. 03/04/20 04/03/20  Delfino Lovett, MD  calcium-vitamin D (OSCAL WITH D) 500-200 MG-UNIT tablet Take 1 tablet by mouth.    [provider]  hydrALAZINE (APRESOLINE) 10 MG tablet Take 1 tablet (10 mg total) by mouth every 8 (eight) hours. Patient taking differently: Take 10 mg by mouth in the morning and at bedtime.  12/05/19   Love, Evlyn Kanner, PA-C  loratadine (CLARITIN) 10 MG tablet Take 1 tablet (10 mg total) by mouth daily. 12/06/19   Love, Evlyn Kanner, PA-C  metoprolol succinate (TOPROL-XL) 50 MG 24 hr tablet Take 50 mg by mouth daily. Take with or immediately following a meal.    [provider]  omeprazole (PRILOSEC) 20 MG capsule Take 20 mg by mouth daily as needed (acid reflux).     [provider]  pravastatin (PRAVACHOL) 40 MG tablet Take 1 tablet (40 mg total) by mouth daily after supper. 12/05/19   Love, Evlyn Kanner, PA-C  vitamin B-12 (CYANOCOBALAMIN) 1000 MCG tablet Take 1,000 mcg by mouth daily.    [provider]     Allergies Sulfa antibiotics and Doxycycline  Family History  Problem Relation Age of Onset   Cancer Mother    Heart  disease Sister     Social History Social History   Tobacco Use   Smoking status: Never Smoker   Smokeless tobacco: Never Used  Building services engineer Use: Never used  Substance Use Topics   Alcohol use: Not Currently   Drug use: Never    Review of Systems  Constitutional: No reports of fever Eyes: No visual changes.  ENT: No congestion Cardiovascular: Denies chest pain. Respiratory: Denies shortness of breath. Gastrointestinal: No abdominal pain.   Genitourinary: No reports of foul-smelling urine Musculoskeletal: Negative for back pain. Skin: Negative for  rash. Neurological: History of CVA   ____________________________________________   PHYSICAL EXAM:  VITAL SIGNS: ED Triage Vitals  Enc Vitals Group     BP 03/21/20 0829 (!) 135/84     Pulse Rate 03/21/20 0829 (!) 147     Resp 03/21/20 0829 17     Temp 03/21/20 0848 98 F (36.7 C)     Temp Source 03/21/20 0829 Rectal     SpO2 03/21/20 0848 99 %     Weight 03/21/20 0830 (!) 43.4 kg (95 lb 9.6 oz)     Height --      Head Circumference --      Peak Flow --      Pain Score 03/21/20 0830 0     Pain Loc --      Pain Edu? --      Excl. in GC? --     Constitutional: Alert  Eyes: Conjunctivae are normal.  Head: Atraumatic. Nose: No congestion/rhinnorhea. Mouth/Throat: Mucous membranes are dry Neck:  Painless ROM Cardiovascular: Tachycardia, irregular rhythm, grossly normal heart sounds.  Good peripheral circulation. Respiratory: Normal respiratory effort.  No retractions. Lungs CTAB. Gastrointestinal: Soft and nontender. No distention.  No CVA tenderness.  Musculoskeletal: No lower extremity tenderness nor edema.  Warm and well perfused Neurologic: Slurred speech, apparently chronic Skin:  Skin is warm, dry and intact. No rash noted. Psychiatric: Mood and affect are normal. Speech and behavior are normal.  ____________________________________________   LABS (all labs ordered are listed, but only abnormal results are displayed)  Labs Reviewed  CBC - Abnormal; Notable for the following components:      Result Value   Hemoglobin 11.9 (*)    Platelets 553 (*)    All other components within normal limits  COMPREHENSIVE METABOLIC PANEL - Abnormal; Notable for the following components:   Glucose, Bld 128 (*)    BUN 26 (*)    Creatinine, Ser 1.30 (*)    Albumin 3.3 (*)    GFR calc non Af Amer 35 (*)    GFR calc Af Amer 41 (*)    All other components within normal limits  TROPONIN I (HIGH SENSITIVITY) - Abnormal; Notable for the following components:   Troponin I (High  Sensitivity) 693 (*)    All other components within normal limits  SARS CORONAVIRUS 2 BY RT PCR (HOSPITAL ORDER, PERFORMED IN [facility])  URINALYSIS, COMPLETE (UACMP) WITH MICROSCOPIC  PROTIME-INR  APTT  CBG MONITORING, ED   ____________________________________________  EKG  ED ECG REPORT I, Jene Every, the attending physician, personally viewed and interpreted this ECG.  Date: 03/21/2020 Rate: 149  rhythm: Atrial fibrillation QRS Axis: normal Intervals: Abnormal ST/T Wave abnormalities: Nonspecific changes Narrative Interpretation: Atrial fibrillation with rapid ventricular response  ____________________________________________  RADIOLOGY  Chest x-ray reviewed by me, no infiltrate or pneumonia, or edema ____________________________________________   PROCEDURES  Procedure(s) performed: No  Procedures   Critical  Care performed: yes  CRITICAL CARE Performed by: Jene Every   Total critical care time:30 minutes  Critical care time was exclusive of separately billable procedures and treating other patients.  Critical care was necessary to treat or prevent imminent or life-threatening deterioration.  Critical care was time spent personally by me on the following activities: development of treatment plan with patient and/or surrogate as well as nursing, discussions with consultants, evaluation of patient's response to treatment, examination of patient, obtaining history from patient or surrogate, ordering and performing treatments and interventions, ordering and review of laboratory studies, ordering and review of radiographic studies, pulse oximetry and re-evaluation of patient's condition.  ____________________________________________   INITIAL IMPRESSION / ASSESSMENT AND PLAN / ED COURSE  Pertinent labs & imaging results that were available during my care of the patient were reviewed by me and considered in my medical decision making (see  chart for details).  Patient presents with generalized weakness as noted above.  Found to be markedly tachycardic, EKG consistent with atrial fibrillation with RVR, this may be causing her weakness.  She also clinically appears quite dehydrated.  IV fluids infusing.  Pending labs.  Lab work notable for elevated BUN to creatinine ratio  Notified of critical troponin of 693.  Will repeat EKG now that heart rate has slowed after IV Cardizem.  Elevated troponin may be related to strain from elevated heart rate over several days  After fluids patient's blood pressure improved after brief dip from bolus Cardizem.  Will start on Cardizem drip at this time  Discussed with Dr. Lady Gary of cardiology who recommends heparin GTT  We will consult hospital service for admission.    ____________________________________________   FINAL CLINICAL IMPRESSION(S) / ED DIAGNOSES  Final diagnoses:  Atrial fibrillation with rapid ventricular response (HCC)  NSTEMI (non-ST elevated myocardial infarction) [facility])        Note:  This document was prepared using Dragon voice recognition software and may include unintentional dictation errors.   Jene Every, MD 03/21/20 1040

## 2020-03-21 NOTE — Progress Notes (Signed)
ANTICOAGULATION CONSULT NOTE - Initial Consult  Pharmacy Consult for Apixaban Indication: atrial fibrillation  Allergies  Allergen Reactions  . Sulfa Antibiotics   . Doxycycline Nausea Only    Patient Measurements: Weight: (!) 43.4 kg (95 lb 9.6 oz)  Vital Signs: Temp: 98 F (36.7 C) (07/25 0848) Temp Source: Rectal (07/25 0848) BP: 122/111 (07/25 1240) Pulse Rate: 30 (07/25 1231)  Labs: Recent Labs    03/21/20 0841 03/21/20 1122  HGB 11.9*  --   HCT 37.5  --   PLT 553*  --   APTT  --  33  LABPROT  --  17.0*  INR  --  1.4*  CREATININE 1.30*  --   TROPONINIHS 693*  --     Estimated Creatinine Clearance: 18.5 mL/min (A) (by C-G formula based on SCr of 1.3 mg/dL (H)).  Assessment: 86 yo female to continue on apixaban for AFib (on apixaban PTA). Last dose PTA 7/24 at 2100.   Goal of Therapy:  Monitor platelets by anticoagulation protocol: Yes   Plan:  Will continue PTA dose of apixaban 2.5 mg PO BID SCr and CBC at least every three days per policy.   Pharmacy will continue to follow.   Marty Heck 03/21/2020,1:04 PM

## 2020-03-21 NOTE — ED Triage Notes (Signed)
Pt comes into the ED via EMS from [facility] with c/o from staff the pt has generalized weakness since Friday. Pt is alert and orient to place and self.denies any pain at present. Pt is in NAD>

## 2020-03-22 ENCOUNTER — Encounter: Payer: Self-pay | Admitting: Internal Medicine

## 2020-03-22 DIAGNOSIS — I4891 Unspecified atrial fibrillation: Secondary | ICD-10-CM

## 2020-03-22 DIAGNOSIS — N1831 Chronic kidney disease, stage 3a: Secondary | ICD-10-CM

## 2020-03-22 DIAGNOSIS — I5032 Chronic diastolic (congestive) heart failure: Secondary | ICD-10-CM

## 2020-03-22 LAB — CBC
HCT: 30.3 % — ABNORMAL LOW (ref 36.0–46.0)
Hemoglobin: 10 g/dL — ABNORMAL LOW (ref 12.0–15.0)
MCH: 29 pg (ref 26.0–34.0)
MCHC: 33 g/dL (ref 30.0–36.0)
MCV: 87.8 fL (ref 80.0–100.0)
Platelets: 385 10*3/uL (ref 150–400)
RBC: 3.45 MIL/uL — ABNORMAL LOW (ref 3.87–5.11)
RDW: 14.2 % (ref 11.5–15.5)
WBC: 6.6 10*3/uL (ref 4.0–10.5)
nRBC: 0 % (ref 0.0–0.2)

## 2020-03-22 LAB — BASIC METABOLIC PANEL
Anion gap: 6 (ref 5–15)
BUN: 23 mg/dL (ref 8–23)
CO2: 27 mmol/L (ref 22–32)
Calcium: 8.1 mg/dL — ABNORMAL LOW (ref 8.9–10.3)
Chloride: 106 mmol/L (ref 98–111)
Creatinine, Ser: 1.13 mg/dL — ABNORMAL HIGH (ref 0.44–1.00)
GFR calc Af Amer: 49 mL/min — ABNORMAL LOW (ref >60)
GFR calc non Af Amer: 42 mL/min — ABNORMAL LOW (ref >60)
Glucose, Bld: 92 mg/dL (ref 70–99)
Potassium: 3.6 mmol/L (ref 3.5–5.1)
Sodium: 139 mmol/L (ref 135–145)

## 2020-03-22 LAB — HEPARIN LEVEL (UNFRACTIONATED): Heparin Unfractionated: >3.6 IU/mL — ABNORMAL HIGH (ref 0.30–0.70)

## 2020-03-22 LAB — TROPONIN I (HIGH SENSITIVITY)
Troponin I (High Sensitivity): 754 ng/L (ref <18)
Troponin I (High Sensitivity): 678 ng/L (ref <18)

## 2020-03-22 MED ORDER — AMIODARONE HCL 200 MG PO TABS
200.0000 mg | ORAL_TABLET | Freq: Two times a day (BID) | ORAL | Status: DC
  Administered 2020-03-22 – 2020-03-23 (×4): 200 mg via ORAL
  Filled 2020-03-22 (×4): qty 1

## 2020-03-22 MED ORDER — POTASSIUM CHLORIDE CRYS ER 20 MEQ PO TBCR
40.0000 meq | EXTENDED_RELEASE_TABLET | Freq: Once | ORAL | Status: AC
  Administered 2020-03-22: 40 meq via ORAL
  Filled 2020-03-22: qty 2

## 2020-03-22 NOTE — Consult Note (Signed)
Cardiology Consultation Note    Patient ID: Sarah Delacruz, MRN: [id number], DOB/AGE: Feb 08, 1924 86 y.o. Admit date: 03/21/2020   Date of Consult: 03/22/2020 Primary Physician: Barbette ReichmannHande, Vishwanath, MD Primary Cardiologist:  Dr. Gwen PoundsKowalski  Chief Complaint: afib with rvr Reason for Consultation: afib with rvr Requesting MD: Dr. Myriam ForehandAyiku  HPI: Sarah Delacruz is a 86 y.o. female with history of HFpEF, hypertension, hyperlipidemia and left bundle branch block who presented to the emergency room with weakness fatigue and noted to have atrial fibrillation with rapid ventricular response.  She had a history of a CVA in the past.  In the emergency room she was noted to have atrial fibrillation with rapid ventricular response with rates in the 140 range.  She is treated with Eliquis at 2.5 mg twice daily, hydralazine 10 mg 3 times daily, metoprolol succinate 50 mg daily, pravastatin 40 mg daily.  She reports compliance with this.  High-sensitivity troponin with 496 initially at an increased to 754 and is subsequently 678.  Patient denies or denied any chest pain.  On admission her creatinine was 1.3 up from a baseline of 0.92.  Creatinine is 1.13 today.  Chest x-ray revealed no acute cardiopulmonary disease.  She was placed on IV Cardizem drip and converted back to sinus rhythm.  She is currently on amiodarone 200 mg twice daily and tolerating this fairly well.  She was somewhat hypertensive on presentation but hemodynamically is stable.  Past Medical History:  Diagnosis Date  . Anemia   . Bundle branch block, left   . Chronic diastolic CHF (congestive heart failure) (HCC)   . CKD (chronic kidney disease), stage III   . GERD (gastroesophageal reflux disease)   . Hyperlipidemia   . Hypertension   . Pruritus   . Stroke (HCC)   . Valvular heart disease       Surgical History: History reviewed. No pertinent surgical history.   Home Meds: Prior to Admission medications   Medication Sig Start Date End Date  Taking? Authorizing Provider  acetaminophen (TYLENOL) 325 MG tablet Take 650 mg by mouth every 6 (six) hours as needed for fever.   Yes [provider]  apixaban (ELIQUIS) 2.5 MG TABS tablet Take 1 tablet (2.5 mg total) by mouth 2 (two) times daily. 03/04/20 04/03/20 Yes Delfino LovettShah, Vipul, MD  calcium-vitamin D (OSCAL WITH D) 500-200 MG-UNIT tablet Take 1 tablet by mouth.   Yes [provider]  Cetirizine HCl 10 MG CAPS Take by mouth.   Yes [provider]  hydrALAZINE (APRESOLINE) 10 MG tablet Take 1 tablet (10 mg total) by mouth every 8 (eight) hours. Patient taking differently: Take 10 mg by mouth in the morning and at bedtime.  12/05/19  Yes Love, Evlyn KannerPamela S, PA-C  ketoconazole (NIZORAL) 2 % cream Apply 1 application topically daily. 03/12/20  Yes [provider]  loratadine (CLARITIN) 10 MG tablet Take 1 tablet (10 mg total) by mouth daily. 12/06/19  Yes Love, Evlyn KannerPamela S, PA-C  metoprolol succinate (TOPROL-XL) 50 MG 24 hr tablet Take 50 mg by mouth daily. Take with or immediately following a meal.   Yes [provider]  omeprazole (PRILOSEC) 20 MG capsule Take 20 mg by mouth daily as needed (acid reflux).    Yes [provider]  pravastatin (PRAVACHOL) 40 MG tablet Take 1 tablet (40 mg total) by mouth daily after supper. 12/05/19  Yes Love, Evlyn KannerPamela S, PA-C  vitamin B-12 (CYANOCOBALAMIN) 1000 MCG tablet Take 1,000 mcg by mouth daily.  Yes [provider]    Inpatient Medications:  . amiodarone  200 mg Oral BID  . apixaban  2.5 mg Oral BID  . pantoprazole  40 mg Oral Daily  . pravastatin  40 mg Oral QPC supper   . amiodarone Stopped (03/21/20 2120)    Allergies:  Allergies  Allergen Reactions  . Sulfa Antibiotics   . Doxycycline Nausea Only    Social History   Socioeconomic History  . Marital status: Legally Separated    Spouse name: Not on file  . Number of children: Not on file  . Years of education: Not on file  . Highest  education level: Not on file  Occupational History  . Not on file  Tobacco Use  . Smoking status: Never Smoker  . Smokeless tobacco: Never Used  Vaping Use  . Vaping Use: Never used  Substance and Sexual Activity  . Alcohol use: Not Currently  . Drug use: Never  . Sexual activity: Not Currently  Other Topics Concern  . Not on file  Social History Narrative  . Not on file   Social Determinants of Health   Financial Resource Strain:   . Difficulty of Paying Living Expenses:   Food Insecurity:   . Worried About Programme researcher, broadcasting/film/video in the Last Year:   . Barista in the Last Year:   Transportation Needs:   . Freight forwarder (Medical):   Marland Kitchen Lack of Transportation (Non-Medical):   Physical Activity:   . Days of Exercise per Week:   . Minutes of Exercise per Session:   Stress:   . Feeling of Stress :   Social Connections:   . Frequency of Communication with Friends and Family:   . Frequency of Social Gatherings with Friends and Family:   . Attends Religious Services:   . Active Member of Clubs or Organizations:   . Attends Banker Meetings:   Marland Kitchen Marital Status:   Intimate Partner Violence:   . Fear of Current or Ex-Partner:   . Emotionally Abused:   Marland Kitchen Physically Abused:   . Sexually Abused:      Family History  Problem Relation Age of Onset  . Cancer Mother   . Heart disease Sister      Review of Systems: A 12-system review of systems was performed and is negative except as noted in the HPI.  Labs: No results for input(s): CKTOTAL, CKMB, TROPONINI in the last 72 hours. Lab Results  Component Value Date   WBC 6.6 03/22/2020   HGB 10.0 (L) 03/22/2020   HCT 30.3 (L) 03/22/2020   MCV 87.8 03/22/2020   PLT 385 03/22/2020    Recent Labs  Lab 03/21/20 0841 03/21/20 0841 03/22/20 0758  NA 136   < > 139  K 4.5   < > 3.6  CL 98   < > 106  CO2 25   < > 27  BUN 26*   < > 23  CREATININE 1.30*   < > 1.13*  CALCIUM 9.4   < > 8.1*  PROT  7.5  --   --   BILITOT 1.2  --   --   ALKPHOS 53  --   --   ALT 14  --   --   AST 34  --   --   GLUCOSE 128*   < > 92   < > = values in this interval not displayed.   Lab Results  Component Value Date  CHOL 133 03/02/2020   HDL 51 03/02/2020   LDLCALC 72 03/02/2020   TRIG 51 03/02/2020   No results found for: DDIMER  Radiology/Studies:  CT Head Wo Contrast  Addendum Date: 03/01/2020   ADDENDUM REPORT: 03/01/2020 11:14 ADDENDUM: Study discussed by telephone with Dr. Dorothea Glassman on 03/01/2020 at 1058 hours. Electronically Signed   By: Odessa Fleming M.D.   On: 03/01/2020 11:14   Result Date: 03/01/2020 CLINICAL DATA:  86 year old female with increased left side weakness and slurred speech onset this morning. Code stroke presentation in March with right MCA M2 occlusion at that time. EXAM: CT HEAD WITHOUT CONTRAST TECHNIQUE: Contiguous axial images were obtained from the base of the skull through the vertex without intravenous contrast. COMPARISON:  Head CT and CTA CTP 10/29/2019. FINDINGS: Brain: Developing encephalomalacia in the right MCA territory which appears to partially correspond to the areas of early cytotoxic edema on 10/29/2019. There is new involvement of the right inferior frontal gyrus, but this appears to have similar chronic density. However, there is more subacute appearing involvement of the right middle and superior frontal gyrus on series 2, image 20. No associated hemorrhage or mass effect. Furthermore there is also a new but chronic appearing small area of cortical encephalomalacia in the left superior perirolandic cortex on series 2, image 21. No intracranial mass effect. Mild ex vacuo lateral ventricular enlargement since March. Posterior fossa gray-white matter differentiation appears stable including small chronic right cerebellar infarct. ASPECTS 9 (abnormal right M5 segment superimposed on suspected chronic ischemia elsewhere in the right MCA territory). Vascular: Calcified  atherosclerosis at the skull base. No suspicious intracranial vascular hyperdensity. Skull: No acute osseous abnormality identified. Sinuses/Orbits: Visualized paranasal sinuses and mastoids are stable and well pneumatized. Other: Mild leftward gaze deviation, otherwise negative orbits and scalp. IMPRESSION: 1. Suspect acute or subacute on chronic right MCA territory ischemia. The most recent appearing cytotoxic edema is in the right superior/middle frontal gyrus (ASPECTS 9). No hemorrhage or mass effect. 2. Developing encephalomalacia elsewhere in the right frontal lobe thought related to evolution of the March infarct. 3. Small chronic right cerebellar infarct. Electronically Signed: By: Odessa Fleming M.D. On: 03/01/2020 10:53   MR BRAIN WO CONTRAST  Result Date: 03/01/2020 CLINICAL DATA:  Stroke suspected. Additional provided: Worsening left-sided weakness, slurred speech. EXAM: MRI HEAD WITHOUT CONTRAST TECHNIQUE: Multiplanar, multiecho pulse sequences of the brain and surrounding structures were obtained without intravenous contrast. COMPARISON:  Head CT 03/01/2020, noncontrast head CT, CT angiogram head/neck and CT perfusion 10/29/2019 FINDINGS: Brain: Mild intermittent motion degradation. 6 mm focus of restricted diffusion within the posterior limb of right internal capsule may reflect wallerian degeneration or a small acute/early subacute infarct (series 5, image 25). There is also an 11 mm cortically based focus of diffusion weighted hyperintensity within the right temporoparietal junction which does not definitively demonstrate T2 shine through on the ADC map and may reflect a small focus of subacute ischemia (series 5, image 25). No evidence of acute or recent subacute infarct elsewhere within the brain. Fairly extensive multifocal chronic infarction changes within the right MCA vascular territory have progressed as compared to prior head CT 10/29/2019. Portions of the frontal, parietal, occipital and  temporal lobes are affected. Redemonstrated small chronic cortical infarct within the left parietal lobe (series 15, images 30 5-30). Stable background moderate to advanced chronic small vessel ischemic disease within the cerebral white matter and pons. Redemonstrated small chronic infarct within the right cerebellum. Stable, moderate generalized parenchymal atrophy. Chronic  hemosiderin deposition associated with some of the chronic right MCA territory infarcts. Redemonstrated mineralization within the basal ganglia and dentate nuclei. No evidence of intracranial mass. No extra-axial fluid collection. No midline shift. Vascular: Expected proximal arterial flow voids. Skull and upper cervical spine: No focal marrow lesion. Sinuses/Orbits: Visualized orbits show no acute finding. Mild ethmoid sinus mucosal thickening. Right mastoid effusion. Other: 13 mm T2 hyperintense lesion along the medial aspect of the left parotid gland suspicious for primary parotid neoplasm (series 10, image 2). IMPRESSION: 1. 6 mm focus of restricted diffusion within the posterior limb of right internal capsule. This may reflect wallerian degeneration or a small acute/early subacute infarct. 2. 11 mm cortically based focus of diffusion weighted hyperintensity within the right temporoparietal junction suspicious for subacute ischemia. 3. Extensive chronic infarction changes with the right MCA vascular territory, progressed as compared to prior head CT 10/29/2019. 4. Redemonstrated small chronic cortically based infarct within the left parietal lobe. 5. Stable background moderate to advanced chronic small vessel ischemic disease within the cerebral white matter and pons. 6. Redemonstrated chronic infarct within the right cerebellum. 7. Stable moderate generalized parenchymal atrophy. 8. 13 mm T2 hyperintense lesion within the medial aspect of the left parotid gland suspicious for primary parotid neoplasm. A nonemergent contrast-enhanced neck CT  is recommended for further evaluation. 9. Mild ethmoid sinus mucosal thickening. 10. Right mastoid effusion. Electronically Signed   By: Jackey Loge DO   On: 03/01/2020 21:32   DG Chest Portable 1 View  Result Date: 03/21/2020 CLINICAL DATA:  Weakness EXAM: PORTABLE CHEST 1 VIEW COMPARISON:  None. FINDINGS: Cardiomegaly. Both lungs are clear. The visualized skeletal structures are unremarkable. IMPRESSION: Cardiomegaly without acute abnormality of the lungs in AP portable projection. Electronically Signed   By: Lauralyn Primes M.D.   On: 03/21/2020 09:26   ECHOCARDIOGRAM COMPLETE  Result Date: 03/03/2020    ECHOCARDIOGRAM REPORT   Patient Name:   Sarah Delacruz Date of Exam: 03/02/2020 Medical Rec #:  [id number]     Height:       66.0 in Accession #:    [id number]    Weight:       106.5 lb Date of Birth:  1924/03/21    BSA:          1.530 m Patient Age:    86 years      BP:           124/70 mmHg Patient Gender: F             HR:           82 bpm. Exam Location:  [facility] Procedure: 2D Echo, Cardiac Doppler and Color Doppler Indications:     I163.9 Stroke  History:         Patient has no prior history of Echocardiogram examinations.                  Risk Factors:Hypertension and Dyslipidemia. Stroke. Chronic                  kidney disease. LBBB. Chronic diastolic heart failure.  Sonographer:     Sedonia Small Rodgers-Jones Referring Phys:  [id number] NIU Diagnosing Phys: Harold Hedge MD IMPRESSIONS  1. Left ventricular ejection fraction, by estimation, is 60 to 65%. The left ventricle has normal function. The left ventricle has no regional wall motion abnormalities. There is severe concentric left ventricular hypertrophy. Left ventricular diastolic  parameters are consistent with Grade I diastolic dysfunction (impaired relaxation).  2.  Right ventricular systolic function is normal. The right ventricular size is normal. There is severely elevated pulmonary artery systolic pressure.  3. Left atrial size was mild to  moderately dilated.  4. Right atrial size was mildly dilated.  5. The mitral valve is grossly normal. Moderate mitral valve regurgitation.  6. The aortic valve was not well visualized. Aortic valve regurgitation is mild. FINDINGS  Left Ventricle: Left ventricular ejection fraction, by estimation, is 60 to 65%. The left ventricle has normal function. The left ventricle has no regional wall motion abnormalities. The left ventricular internal cavity size was normal in size. There is  severe concentric left ventricular hypertrophy. Left ventricular diastolic parameters are consistent with Grade I diastolic dysfunction (impaired relaxation). Right Ventricle: The right ventricular size is normal. No increase in right ventricular wall thickness. Right ventricular systolic function is normal. There is severely elevated pulmonary artery systolic pressure. The tricuspid regurgitant velocity is 3.69 m/s, and with an assumed right atrial pressure of 10 mmHg, the estimated right ventricular systolic pressure is 64.5 mmHg. Left Atrium: Left atrial size was mild to moderately dilated. Right Atrium: Right atrial size was mildly dilated. Pericardium: There is no evidence of pericardial effusion. Mitral Valve: The mitral valve is grossly normal. Moderate mitral valve regurgitation. Tricuspid Valve: The tricuspid valve is not well visualized. Tricuspid valve regurgitation is trivial. Aortic Valve: The aortic valve was not well visualized. Aortic valve regurgitation is mild. Aortic regurgitation PHT measures 340 msec. Pulmonic Valve: The pulmonic valve was not well visualized. Pulmonic valve regurgitation is trivial. Aorta: The aortic root is normal in size and structure. IAS/Shunts: The interatrial septum was not assessed.  LEFT VENTRICLE PLAX 2D LVIDd:         3.27 cm  Diastology LVIDs:         2.12 cm  LV e' lateral:   4.43 cm/s LV PW:         1.15 cm  LV E/e' lateral: 24.2 LV IVS:        1.29 cm  LV e' medial:    4.43 cm/s LVOT  diam:     1.50 cm  LV E/e' medial:  24.2 LVOT Area:     1.77 cm  RIGHT VENTRICLE             IVC RV Basal diam:  3.39 cm     IVC diam: 1.37 cm RV S prime:     14.00 cm/s TAPSE (M-mode): 2.0 cm LEFT ATRIUM             Index       RIGHT ATRIUM           Index LA diam:        4.40 cm 2.88 cm/m  RA Area:     11.40 cm LA Vol (A2C):   62.1 ml 40.59 ml/m RA Volume:   25.80 ml  16.86 ml/m LA Vol (A4C):   78.6 ml 51.38 ml/m LA Biplane Vol: 70.8 ml 46.28 ml/m  AORTIC VALVE AI PHT:      340 msec  AORTA Ao Root diam: 2.60 cm MITRAL VALVE                TRICUSPID VALVE MV Area (PHT): 2.37 cm     TR Peak grad:   54.5 mmHg MV Decel Time: 320 msec     TR Vmax:        369.00 cm/s MV E velocity: 107.00 cm/s MV A velocity: 128.00 cm/s  SHUNTS MV  E/A ratio:  0.84         Systemic Diam: 1.50 cm Harold Hedge MD Electronically signed by Harold Hedge MD Signature Date/Time: 03/03/2020/7:32:39 AM    Final     Wt Readings from Last 3 Encounters:  03/22/20 46.7 kg  03/01/20 48.3 kg  12/05/19 48.3 kg    EKG: Atrial fibrillation with rapid ventricular response with left bundle branch block   Physical Exam:  Blood pressure (!) 155/51, pulse 65, temperature (!) 97.4 F (36.3 C), temperature source Oral, resp. rate 17, height 5\' 6"  (1.676 m), weight 46.7 kg, SpO2 99 %. Body mass index is 16.62 kg/m. General: Well developed, well nourished, in no acute distress. Head: Normocephalic, atraumatic, sclera non-icteric, no xanthomas, nares are without discharge.  Neck: Negative for carotid bruits. JVD not elevated. Lungs: Clear bilaterally to auscultation without wheezes, rales, or rhonchi. Breathing is unlabored. Heart: Irregular irregular rhythm Abdomen: Soft, non-tender, non-distended with normoactive bowel sounds. No hepatomegaly. No rebound/guarding. No obvious abdominal masses. Msk:  Strength and tone appear normal for age. Extremities: No clubbing or cyanosis. No edema.  Distal pedal pulses are 2+ and equal  bilaterally. Neuro: Alert and oriented X 3. No facial asymmetry. No focal deficit. Moves all extremities spontaneously. Psych:  Responds to questions appropriately with a normal affect.     Assessment and Plan  86 year old female with history of CVA, atrial fibrillation treated with metoprolol succinate and anticoagulated with Eliquis at 2.5 mg twice daily.  She presented to emergency room feeling fatigue and was noted to be in atrial fibrillation with rapid ventricular response.  Due to relatively soft blood pressure Cardizem was not possible and she was given IV amiodarone.  She has converted back to sinus rhythm with IV amiodarone bolus followed by drip.  She is currently in sinus rhythm on amiodarone 200 mg p.o. twice daily.  She is tolerating this well.  She is tolerating her Eliquis well.  She had an elevated serum troponin.  This appears to be demand due to her tachycardia and relative renal insufficiency.  She also appears to have been mildly volume depleted given her creatinine bump and gradual improvement since admission.  Not a candidate for invasive or noninvasive ischemic work-up.  Echo in March of this year.  Would continue with amiodarone 200 mg daily and anticoagulate with 2.5 mg twice daily of Eliquis.  Follow on this regimen if she remains in sinus rhythm consider discharge in the a.m. with outpatient follow-up.  Signed, April MD 03/22/2020, 1:01 PM Pager: [phone]

## 2020-03-22 NOTE — Plan of Care (Signed)
Cardiac rhythm in NSR with 1st degree AVB and BBB; rate controlled.     Problem: Education: Goal: Knowledge of General Education information will improve Description: Including pain rating scale, medication(s)/side effects and non-pharmacologic comfort measures Outcome: Progressing   Problem: Clinical Measurements: Goal: Ability to maintain clinical measurements within normal limits will improve Outcome: Progressing Goal: Will remain free from infection Outcome: Progressing Goal: Diagnostic test results will improve Outcome: Progressing Goal: Respiratory complications will improve Outcome: Progressing Goal: Cardiovascular complication will be avoided Outcome: Progressing   Problem: Education: Goal: Knowledge of disease or condition will improve Outcome: Progressing Goal: Understanding of medication regimen will improve Outcome: Progressing Goal: Individualized Educational Video(s) Outcome: Progressing   Problem: Activity: Goal: Ability to tolerate increased activity will improve Outcome: Progressing   Problem: Cardiac: Goal: Ability to achieve and maintain adequate cardiopulmonary perfusion will improve Outcome: Progressing   Problem: Health Behavior/Discharge Planning: Goal: Ability to safely manage health-related needs after discharge will improve Outcome: Progressing

## 2020-03-22 NOTE — Progress Notes (Addendum)
Progress Note    Sarah Delacruz  TWS:[id number] DOB: 1924/08/04  DOA: 03/21/2020 PCP: Barbette Reichmann, MD      Brief Narrative:    Medical records reviewed and are as summarized below:  Sarah Delacruz is a 86 y.o. female       Assessment/Plan:   Principal Problem:   Hypotension Active Problems:   Chronic diastolic congestive heart failure (HCC)   Cerebrovascular disease   Atrial fibrillation with RVR (HCC)   CKD (chronic kidney disease), stage IIIa   DNR (do not resuscitate) discussion    A. fib with RVR complicated by hypotension: Converted to normal sinus rhythm.  IV amiodarone has been switched to oral amiodarone.  Continue Eliquis.  Appreciate input from cardiologist.  AKI on CKD stage IIIa: Creatinine is better today.  Continue to monitor.  Elevated troponin: This is likely due to demand ischemia from rapid A. fib.  No further work-up.  Chronic diastolic dysfunction: 2D echo in March 02, 2020 showed EF estimated at 60 to 65%, moderate MR, severely elevated pulmonary artery systolic pressure, grade 1 diastolic dysfunction  History of stroke: Continue Eliquis     Body mass index is 16.62 kg/m.  Diet Order            DIET - DYS 1 Room service appropriate? Yes; Fluid consistency: Thin  Diet effective now                       Medications:   . amiodarone  200 mg Oral BID  . apixaban  2.5 mg Oral BID  . pantoprazole  40 mg Oral Daily  . pravastatin  40 mg Oral QPC supper   Continuous Infusions: . amiodarone Stopped (03/21/20 2120)     Anti-infectives (From admission, onward)   None             Family Communication/Anticipated D/C date and plan/Code Status   DVT prophylaxis: apixaban (ELIQUIS) tablet 2.5 mg Start: 03/21/20 1400 apixaban (ELIQUIS) tablet 2.5 mg     Code Status: DNR  Family Communication: Plan discussed with Diane, daughter Disposition Plan:    Status is: Inpatient  Remains inpatient appropriate  because:Inpatient level of care appropriate due to severity of illness   Dispo: The patient is from: Home              Anticipated d/c is to: Home              Anticipated d/c date is: 1 day              Patient currently is not medically stable to d/c.           Subjective:   No shortness of breath, palpitations or chest pain.  She feels a little better today.  Objective:    Vitals:   03/21/20 2354 03/22/20 0409 03/22/20 0731 03/22/20 1134  BP: (!) 121/52 (!) 129/52 (!) 145/55 (!) 155/51  Pulse: 67 70 63 65  Resp: 18 18 17 17   Temp:  (!) 97.5 F (36.4 C) 97.9 F (36.6 C) (!) 97.4 F (36.3 C)  TempSrc:  Oral  Oral  SpO2: 100% 100% 100% 99%  Weight:  46.7 kg    Height:       No data found.   Intake/Output Summary (Last 24 hours) at 03/22/2020 1349 Last data filed at 03/22/2020 1043 Gross per 24 hour  Intake 3761.79 ml  Output 1000 ml  Net 2761.79 ml   03/24/2020  Weights   03/21/20 0830 03/21/20 1719 03/22/20 0409  Weight: (!) 43.4 kg 46.9 kg 46.7 kg    Exam:  GEN: NAD SKIN: No rash EYES: EOMI ENT: MMM CV: RRR PULM: CTA B ABD: soft, ND, NT, +BS CNS: AAO x 3, left facial droop, left upper extremity weakness with contracture at the left elbow.  Slurred speech EXT: No edema or tenderness   Data Reviewed:   I have personally reviewed following labs and imaging studies:  Labs: Labs show the following:   Basic Metabolic Panel: Recent Labs  Lab 03/21/20 0841 03/22/20 0758  NA 136 139  K 4.5 3.6  CL 98 106  CO2 25 27  GLUCOSE 128* 92  BUN 26* 23  CREATININE 1.30* 1.13*  CALCIUM 9.4 8.1*   GFR Estimated Creatinine Clearance: 22.9 mL/min (A) (by C-G formula based on SCr of 1.13 mg/dL (H)). Liver Function Tests: Recent Labs  Lab 03/21/20 0841  AST 34  ALT 14  ALKPHOS 53  BILITOT 1.2  PROT 7.5  ALBUMIN 3.3*   No results for input(s): LIPASE, AMYLASE in the last 168 hours. No results for input(s): AMMONIA in the last 168  hours. Coagulation profile Recent Labs  Lab 03/21/20 1122  INR 1.4*    CBC: Recent Labs  Lab 03/21/20 0841 03/22/20 0758  WBC 9.7 6.6  HGB 11.9* 10.0*  HCT 37.5 30.3*  MCV 91.2 87.8  PLT 553* 385   Cardiac Enzymes: No results for input(s): CKTOTAL, CKMB, CKMBINDEX, TROPONINI in the last 168 hours. BNP (last 3 results) No results for input(s): PROBNP in the last 8760 hours. CBG: No results for input(s): GLUCAP in the last 168 hours. D-Dimer: No results for input(s): DDIMER in the last 72 hours. Hgb A1c: No results for input(s): HGBA1C in the last 72 hours. Lipid Profile: No results for input(s): CHOL, HDL, LDLCALC, TRIG, CHOLHDL, LDLDIRECT in the last 72 hours. Thyroid function studies: No results for input(s): TSH, T4TOTAL, T3FREE, THYROIDAB in the last 72 hours.  Invalid input(s): FREET3 Anemia work up: No results for input(s): VITAMINB12, FOLATE, FERRITIN, TIBC, IRON, RETICCTPCT in the last 72 hours. Sepsis Labs: Recent Labs  Lab 03/21/20 0841 03/22/20 0758  WBC 9.7 6.6    Microbiology Recent Results (from the past 240 hour(s))  SARS Coronavirus 2 by RT PCR (hospital order, performed in [facility] hospital lab) Nasopharyngeal Nasopharyngeal Swab     Status: None   Collection Time: 03/21/20 10:49 AM   Specimen: Nasopharyngeal Swab  Result Value Ref Range Status   SARS Coronavirus 2 NEGATIVE NEGATIVE Final    Comment: (NOTE) SARS-CoV-2 target nucleic acids are NOT DETECTED.  The SARS-CoV-2 RNA is generally detectable in upper and lower respiratory specimens during the acute phase of infection. The lowest concentration of SARS-CoV-2 viral copies this assay can detect is 250 copies / mL. A negative result does not preclude SARS-CoV-2 infection and should not be used as the sole basis for treatment or other patient management decisions.  A negative result may occur with improper specimen collection / handling, submission of specimen other than  nasopharyngeal swab, presence of viral mutation(s) within the areas targeted by this assay, and inadequate number of viral copies (<250 copies / mL). A negative result must be combined with clinical observations, patient history, and epidemiological information.  Fact Sheet for Patients:   BoilerBrush.com.cy  Fact Sheet for Healthcare Providers: https://pope.com/  This test is not yet approved or  cleared by the [place] FDA and has been  authorized for detection and/or diagnosis of SARS-CoV-2 by FDA under an Emergency Use Authorization (EUA).  This EUA will remain in effect (meaning this test can be used) for the duration of the COVID-19 declaration under Section 564(b)(1) of the Act, 21 U.S.C. section 360bbb-3(b)(1), unless the authorization is terminated or revoked sooner.  Performed at [facility], [address] [place], [place] [address]     Procedures and diagnostic studies:  DG Chest Portable 1 View  Result Date: 03/21/2020 CLINICAL DATA:  Weakness EXAM: PORTABLE CHEST 1 VIEW COMPARISON:  None. FINDINGS: Cardiomegaly. Both lungs are clear. The visualized skeletal structures are unremarkable. IMPRESSION: Cardiomegaly without acute abnormality of the lungs in AP portable projection. Electronically Signed   By: Lauralyn Primes M.D.   On: 03/21/2020 09:26               LOS: 1 day   Jaely Silman  [facility]     03/22/2020, 1:49 PM

## 2020-03-23 LAB — BASIC METABOLIC PANEL
Anion gap: 9 (ref 5–15)
BUN: 16 mg/dL (ref 8–23)
CO2: 24 mmol/L (ref 22–32)
Calcium: 8.7 mg/dL — ABNORMAL LOW (ref 8.9–10.3)
Chloride: 107 mmol/L (ref 98–111)
Creatinine, Ser: 1.09 mg/dL — ABNORMAL HIGH (ref 0.44–1.00)
GFR calc Af Amer: 51 mL/min — ABNORMAL LOW (ref >60)
GFR calc non Af Amer: 44 mL/min — ABNORMAL LOW (ref >60)
Glucose, Bld: 92 mg/dL (ref 70–99)
Potassium: 4.2 mmol/L (ref 3.5–5.1)
Sodium: 140 mmol/L (ref 135–145)

## 2020-03-23 LAB — MAGNESIUM: Magnesium: 1.5 mg/dL — ABNORMAL LOW (ref 1.7–2.4)

## 2020-03-23 MED ORDER — HYDRALAZINE HCL 10 MG PO TABS: 10.0000 mg | ORAL_TABLET | Freq: Two times a day (BID) | ORAL | Status: AC

## 2020-03-23 MED ORDER — AMIODARONE HCL 200 MG PO TABS: 200.0000 mg | ORAL_TABLET | Freq: Two times a day (BID) | ORAL | Status: AC

## 2020-03-23 MED ORDER — MAGNESIUM SULFATE 2 GM/50ML IV SOLN
2.0000 g | Freq: Once | INTRAVENOUS | Status: AC
  Administered 2020-03-23: 2 g via INTRAVENOUS
  Filled 2020-03-23: qty 50

## 2020-03-23 NOTE — Progress Notes (Signed)
Patient Name: Sarah Delacruz Date of Encounter: 03/23/2020  Hospital Problem List     Principal Problem:   Hypotension Active Problems:   Chronic diastolic congestive heart failure (HCC)   Cerebrovascular disease   Atrial fibrillation with RVR (HCC)   CKD (chronic kidney disease), stage IIIa   DNR (do not resuscitate) discussion    Patient Profile     86 y.o. female with history of HFpEF, hypertension, hyperlipidemia and left bundle branch block who presented to the emergency room with weakness fatigue and noted to have atrial fibrillation with rapid ventricular response.  She had a history of a CVA in the past.  In the emergency room she was noted to have atrial fibrillation with rapid ventricular response with rates in the 140 range.  She is treated with Eliquis at 2.5 mg twice daily, hydralazine 10 mg 3 times daily, metoprolol succinate 50 mg daily, pravastatin 40 mg daily.  She reports compliance with this.  High-sensitivity troponin with 496 initially at an increased to 754 and is subsequently 678.  Patient denies or denied any chest pain.  On admission her creatinine was 1.3 up from a baseline of 0.92.  Creatinine is 1.13 today.  Chest x-ray revealed no acute cardiopulmonary disease.  She was placed on IV Cardizem drip and converted back to sinus rhythm.  She is currently on amiodarone 200 mg twice daily and tolerating this fairly well.  She was somewhat hypertensive on presentation but hemodynamically is stable.   Subjective   Remains in nsr. Feeling better today  Inpatient Medications     amiodarone  200 mg Oral BID   apixaban  2.5 mg Oral BID   pantoprazole  40 mg Oral Daily   pravastatin  40 mg Oral QPC supper    Vital Signs    Vitals:   03/22/20 1653 03/22/20 2020 03/23/20 0437 03/23/20 0733  BP: (!) 147/48 (!) 155/52 (!) 151/64 (!) 159/56  Pulse: 64 68 72 89  Resp: 17 19 18 17   Temp: 98.2 F (36.8 C) 97.7 F (36.5 C) (!) 97.4 F (36.3 C) (!) 97.5 F (36.4  C)  TempSrc:   Oral Oral  SpO2: 100% 97% 95% 100%  Weight:      Height:        Intake/Output Summary (Last 24 hours) at 03/23/2020 1009 Last data filed at 03/23/2020 0639 Gross per 24 hour  Intake 250 ml  Output 2550 ml  Net -2300 ml   Filed Weights   03/21/20 0830 03/21/20 1719 03/22/20 0409  Weight: (!) 43.4 kg 46.9 kg 46.7 kg    Physical Exam    GEN: Well nourished, well developed, in no acute distress.  HEENT: normal.  Neck: Supple, no JVD, carotid bruits, or masses. Cardiac: RRR, no murmurs, rubs, or gallops. No clubbing, cyanosis, edema.  Radials/DP/PT 2+ and equal bilaterally.  Respiratory:  Respirations regular and unlabored, clear to auscultation bilaterally. GI: Soft, nontender, nondistended, BS + x 4. MS: no deformity or atrophy. Skin: warm and dry, no rash. Neuro:  Strength and sensation are intact. Psych: Normal affect.  Labs    CBC Recent Labs    03/21/20 0841 03/22/20 0758  WBC 9.7 6.6  HGB 11.9* 10.0*  HCT 37.5 30.3*  MCV 91.2 87.8  PLT 553* 385   Basic Metabolic Panel Recent Labs    03/24/20 0758 03/23/20 0638  NA 139 140  K 3.6 4.2  CL 106 107  CO2 27 24  GLUCOSE 92 92  BUN  23 16  CREATININE 1.13* 1.09*  CALCIUM 8.1* 8.7*  MG  --  1.5*   Liver Function Tests Recent Labs    03/21/20 0841  AST 34  ALT 14  ALKPHOS 53  BILITOT 1.2  PROT 7.5  ALBUMIN 3.3*   No results for input(s): LIPASE, AMYLASE in the last 72 hours. Cardiac Enzymes No results for input(s): CKTOTAL, CKMB, CKMBINDEX, TROPONINI in the last 72 hours. BNP No results for input(s): BNP in the last 72 hours. D-Dimer No results for input(s): DDIMER in the last 72 hours. Hemoglobin A1C No results for input(s): HGBA1C in the last 72 hours. Fasting Lipid Panel No results for input(s): CHOL, HDL, LDLCALC, TRIG, CHOLHDL, LDLDIRECT in the last 72 hours. Thyroid Function Tests No results for input(s): TSH, T4TOTAL, T3FREE, THYROIDAB in the last 72 hours.  Invalid  input(s): FREET3  Telemetry    nsr  ECG    afib with rvr  Radiology    CT Head Wo Contrast  Addendum Date: 03/01/2020   ADDENDUM REPORT: 03/01/2020 11:14 ADDENDUM: Study discussed by telephone with Dr. Dorothea Glassman on 03/01/2020 at 1058 hours. Electronically Signed   By: Odessa Fleming M.D.   On: 03/01/2020 11:14   Result Date: 03/01/2020 CLINICAL DATA:  86 year old female with increased left side weakness and slurred speech onset this morning. Code stroke presentation in March with right MCA M2 occlusion at that time. EXAM: CT HEAD WITHOUT CONTRAST TECHNIQUE: Contiguous axial images were obtained from the base of the skull through the vertex without intravenous contrast. COMPARISON:  Head CT and CTA CTP 10/29/2019. FINDINGS: Brain: Developing encephalomalacia in the right MCA territory which appears to partially correspond to the areas of early cytotoxic edema on 10/29/2019. There is new involvement of the right inferior frontal gyrus, but this appears to have similar chronic density. However, there is more subacute appearing involvement of the right middle and superior frontal gyrus on series 2, image 20. No associated hemorrhage or mass effect. Furthermore there is also a new but chronic appearing small area of cortical encephalomalacia in the left superior perirolandic cortex on series 2, image 21. No intracranial mass effect. Mild ex vacuo lateral ventricular enlargement since March. Posterior fossa gray-white matter differentiation appears stable including small chronic right cerebellar infarct. ASPECTS 9 (abnormal right M5 segment superimposed on suspected chronic ischemia elsewhere in the right MCA territory). Vascular: Calcified atherosclerosis at the skull base. No suspicious intracranial vascular hyperdensity. Skull: No acute osseous abnormality identified. Sinuses/Orbits: Visualized paranasal sinuses and mastoids are stable and well pneumatized. Other: Mild leftward gaze deviation, otherwise  negative orbits and scalp. IMPRESSION: 1. Suspect acute or subacute on chronic right MCA territory ischemia. The most recent appearing cytotoxic edema is in the right superior/middle frontal gyrus (ASPECTS 9). No hemorrhage or mass effect. 2. Developing encephalomalacia elsewhere in the right frontal lobe thought related to evolution of the March infarct. 3. Small chronic right cerebellar infarct. Electronically Signed: By: Odessa Fleming M.D. On: 03/01/2020 10:53   MR BRAIN WO CONTRAST  Result Date: 03/01/2020 CLINICAL DATA:  Stroke suspected. Additional provided: Worsening left-sided weakness, slurred speech. EXAM: MRI HEAD WITHOUT CONTRAST TECHNIQUE: Multiplanar, multiecho pulse sequences of the brain and surrounding structures were obtained without intravenous contrast. COMPARISON:  Head CT 03/01/2020, noncontrast head CT, CT angiogram head/neck and CT perfusion 10/29/2019 FINDINGS: Brain: Mild intermittent motion degradation. 6 mm focus of restricted diffusion within the posterior limb of right internal capsule may reflect wallerian degeneration or a small acute/early subacute infarct (  series 5, image 25). There is also an 11 mm cortically based focus of diffusion weighted hyperintensity within the right temporoparietal junction which does not definitively demonstrate T2 shine through on the ADC map and may reflect a small focus of subacute ischemia (series 5, image 25). No evidence of acute or recent subacute infarct elsewhere within the brain. Fairly extensive multifocal chronic infarction changes within the right MCA vascular territory have progressed as compared to prior head CT 10/29/2019. Portions of the frontal, parietal, occipital and temporal lobes are affected. Redemonstrated small chronic cortical infarct within the left parietal lobe (series 15, images 30 5-30). Stable background moderate to advanced chronic small vessel ischemic disease within the cerebral white matter and pons. Redemonstrated small  chronic infarct within the right cerebellum. Stable, moderate generalized parenchymal atrophy. Chronic hemosiderin deposition associated with some of the chronic right MCA territory infarcts. Redemonstrated mineralization within the basal ganglia and dentate nuclei. No evidence of intracranial mass. No extra-axial fluid collection. No midline shift. Vascular: Expected proximal arterial flow voids. Skull and upper cervical spine: No focal marrow lesion. Sinuses/Orbits: Visualized orbits show no acute finding. Mild ethmoid sinus mucosal thickening. Right mastoid effusion. Other: 13 mm T2 hyperintense lesion along the medial aspect of the left parotid gland suspicious for primary parotid neoplasm (series 10, image 2). IMPRESSION: 1. 6 mm focus of restricted diffusion within the posterior limb of right internal capsule. This may reflect wallerian degeneration or a small acute/early subacute infarct. 2. 11 mm cortically based focus of diffusion weighted hyperintensity within the right temporoparietal junction suspicious for subacute ischemia. 3. Extensive chronic infarction changes with the right MCA vascular territory, progressed as compared to prior head CT 10/29/2019. 4. Redemonstrated small chronic cortically based infarct within the left parietal lobe. 5. Stable background moderate to advanced chronic small vessel ischemic disease within the cerebral white matter and pons. 6. Redemonstrated chronic infarct within the right cerebellum. 7. Stable moderate generalized parenchymal atrophy. 8. 13 mm T2 hyperintense lesion within the medial aspect of the left parotid gland suspicious for primary parotid neoplasm. A nonemergent contrast-enhanced neck CT is recommended for further evaluation. 9. Mild ethmoid sinus mucosal thickening. 10. Right mastoid effusion. Electronically Signed   By: Jackey Loge DO   On: 03/01/2020 21:32   DG Chest Portable 1 View  Result Date: 03/21/2020 CLINICAL DATA:  Weakness EXAM: PORTABLE  CHEST 1 VIEW COMPARISON:  None. FINDINGS: Cardiomegaly. Both lungs are clear. The visualized skeletal structures are unremarkable. IMPRESSION: Cardiomegaly without acute abnormality of the lungs in AP portable projection. Electronically Signed   By: Lauralyn Primes M.D.   On: 03/21/2020 09:26   ECHOCARDIOGRAM COMPLETE  Result Date: 03/03/2020    ECHOCARDIOGRAM REPORT   Patient Name:   Sarah Delacruz Date of Exam: 03/02/2020 Medical Rec #:  [id number]     Height:       66.0 in Accession #:    [id number]    Weight:       106.5 lb Date of Birth:  10-10-1923    BSA:          1.530 m Patient Age:    89 years      BP:           124/70 mmHg Patient Gender: F             HR:           82 bpm. Exam Location:  [facility] Procedure: 2D Echo, Cardiac Doppler and Color Doppler Indications:  I163.9 Stroke  History:         Patient has no prior history of Echocardiogram examinations.                  Risk Factors:Hypertension and Dyslipidemia. Stroke. Chronic                  kidney disease. LBBB. Chronic diastolic heart failure.  Sonographer:     Sedonia Small Rodgers-Jones Referring Phys:  [id number] RUEAV NIU Diagnosing Phys: Harold Hedge MD IMPRESSIONS  1. Left ventricular ejection fraction, by estimation, is 60 to 65%. The left ventricle has normal function. The left ventricle has no regional wall motion abnormalities. There is severe concentric left ventricular hypertrophy. Left ventricular diastolic  parameters are consistent with Grade I diastolic dysfunction (impaired relaxation).  2. Right ventricular systolic function is normal. The right ventricular size is normal. There is severely elevated pulmonary artery systolic pressure.  3. Left atrial size was mild to moderately dilated.  4. Right atrial size was mildly dilated.  5. The mitral valve is grossly normal. Moderate mitral valve regurgitation.  6. The aortic valve was not well visualized. Aortic valve regurgitation is mild. FINDINGS  Left Ventricle: Left ventricular ejection  fraction, by estimation, is 60 to 65%. The left ventricle has normal function. The left ventricle has no regional wall motion abnormalities. The left ventricular internal cavity size was normal in size. There is  severe concentric left ventricular hypertrophy. Left ventricular diastolic parameters are consistent with Grade I diastolic dysfunction (impaired relaxation). Right Ventricle: The right ventricular size is normal. No increase in right ventricular wall thickness. Right ventricular systolic function is normal. There is severely elevated pulmonary artery systolic pressure. The tricuspid regurgitant velocity is 3.69 m/s, and with an assumed right atrial pressure of 10 mmHg, the estimated right ventricular systolic pressure is 64.5 mmHg. Left Atrium: Left atrial size was mild to moderately dilated. Right Atrium: Right atrial size was mildly dilated. Pericardium: There is no evidence of pericardial effusion. Mitral Valve: The mitral valve is grossly normal. Moderate mitral valve regurgitation. Tricuspid Valve: The tricuspid valve is not well visualized. Tricuspid valve regurgitation is trivial. Aortic Valve: The aortic valve was not well visualized. Aortic valve regurgitation is mild. Aortic regurgitation PHT measures 340 msec. Pulmonic Valve: The pulmonic valve was not well visualized. Pulmonic valve regurgitation is trivial. Aorta: The aortic root is normal in size and structure. IAS/Shunts: The interatrial septum was not assessed.  LEFT VENTRICLE PLAX 2D LVIDd:         3.27 cm  Diastology LVIDs:         2.12 cm  LV e' lateral:   4.43 cm/s LV PW:         1.15 cm  LV E/e' lateral: 24.2 LV IVS:        1.29 cm  LV e' medial:    4.43 cm/s LVOT diam:     1.50 cm  LV E/e' medial:  24.2 LVOT Area:     1.77 cm  RIGHT VENTRICLE             IVC RV Basal diam:  3.39 cm     IVC diam: 1.37 cm RV S prime:     14.00 cm/s TAPSE (M-mode): 2.0 cm LEFT ATRIUM             Index       RIGHT ATRIUM           Index LA diam:  4.40 cm 2.88 cm/m  RA Area:     11.40 cm LA Vol (A2C):   62.1 ml 40.59 ml/m RA Volume:   25.80 ml  16.86 ml/m LA Vol (A4C):   78.6 ml 51.38 ml/m LA Biplane Vol: 70.8 ml 46.28 ml/m  AORTIC VALVE AI PHT:      340 msec  AORTA Ao Root diam: 2.60 cm MITRAL VALVE                TRICUSPID VALVE MV Area (PHT): 2.37 cm     TR Peak grad:   54.5 mmHg MV Decel Time: 320 msec     TR Vmax:        369.00 cm/s MV E velocity: 107.00 cm/s MV A velocity: 128.00 cm/s  SHUNTS MV E/A ratio:  0.84         Systemic Diam: 1.50 cm Harold HedgeKenneth Yolinda Duerr MD Electronically signed by Harold HedgeKenneth Markevius Trombetta MD Signature Date/Time: 03/03/2020/7:32:39 AM    Final     Assessment & Plan    86 year old female with history of CVA, atrial fibrillation treated with metoprolol succinate and anticoagulated with Eliquis at 2.5 mg twice daily.  She presented to emergency room feeling fatigue and was noted to be in atrial fibrillation with rapid ventricular response.  Due to relatively soft blood pressure Cardizem was not possible and she was given IV amiodarone.  She has converted back to sinus rhythm with IV amiodarone bolus followed by drip.  She is currently in sinus rhythm on amiodarone 200 mg p.o. twice daily.  She is tolerating this well.  She is tolerating her Eliquis well.  She had an elevated serum troponin.  This appears to be demand due to her tachycardia and relative renal insufficiency.  She also appears to have been mildly volume depleted given her creatinine bump and gradual improvement since admission.  Not a candidate for invasive or noninvasive ischemic work-up.  Echo in March of this year.  Would continue with amiodarone 200 mg daily and anticoagulate with 2.5 mg twice daily of Eliquis.  OK for discharge today on amiodarone 200 mg bid and resume metoprolol succinate 50 mg dialy.   Signed, Darlin PriestlyKenneth A. Mckinna Demars MD 03/23/2020, 10:09 AM  Pager: [phone]

## 2020-03-23 NOTE — Progress Notes (Signed)
Sarah Delacruz to be D/C'd Skilled nursing facility per MD order. Patient given discharge teaching and paperwork regarding medications, diet, follow-up appointments and activity. Patient understanding verbalized. No questions or complaints at this time. Skin condition as charted. IV and telemetry removed prior to leaving.  No further needs by Care Management/Social Work. Packet prepared by CSW  An After Visit Summary was printed and placed in packet. Family at bedside aware of transfer.  EMS called for transports. Report called to Diplomatic Services operational officer at [employer], [place]

## 2020-03-23 NOTE — Discharge Summary (Signed)
Physician Discharge Summary  Sarah Delacruz GLO:[id number] DOB: 11/15/1923 DOA: 03/21/2020  PCP: Barbette Reichmann, MD  Admit date: 03/21/2020 Discharge date: 03/23/2020  Discharge disposition: Skilled nursing facility   Recommendations for Outpatient Follow-Up:   Follow-up with cardiologist within 1 month of discharge   Discharge Diagnosis:   Principal Problem:   Atrial fibrillation with RVR (HCC) Active Problems:   Chronic diastolic congestive heart failure (HCC)   Cerebrovascular disease   CKD (chronic kidney disease), stage IIIa   DNR (do not resuscitate) discussion   Hypotension    Discharge Condition: Stable.  Diet recommendation:  Diet Order            DIET - DYS 1           DIET - DYS 1 Room service appropriate? Yes; Fluid consistency: Thin  Diet effective now                   Code Status: DNR     Hospital Course:   Ms. Sarah Delacruz is an 86 y.o. female with PMH significant for multiple strokes (recently discharged from the hospital on 03/04/2020 for acute embolic stroke), chronic diastolic heart failure, hypertension, CKD 3a and known atrial fibrillation who was in her usual state of health until she was noted to be somewhat weaker than usual by nursing home staff as well as her daughter.  Patient was brought to the ED where she was noted to have A. fib with RVR and hypotension.  She was given IV fluids for hypotension.  She was treated with IV amiodarone bolus followed by infusion.  She converted to normal sinus rhythm and she was subsequently switched to oral amiodarone.  She also had AKI on admission but this improved with IV fluids.  Dr. Lady Gary, cardiologist, was involved in her care.  Her condition has improved and she is deemed stable for discharge to SNF today.  Discharge plan was discussed with patient and her daughter at the bedside.     Medical Consultants:   Cardiologist, Dr. Lady Gary  Discharge Exam:   Vitals:   03/23/20 0437 03/23/20 0733   BP: (!) 151/64 (!) 159/56  Pulse: 72 89  Resp: 18 17  Temp: (!) 97.4 F (36.3 C) (!) 97.5 F (36.4 C)  SpO2: 95% 100%   Vitals:   03/22/20 1653 03/22/20 2020 03/23/20 0437 03/23/20 0733  BP: (!) 147/48 (!) 155/52 (!) 151/64 (!) 159/56  Pulse: 64 68 72 89  Resp: 17 19 18 17   Temp: 98.2 F (36.8 C) 97.7 F (36.5 C) (!) 97.4 F (36.3 C) (!) 97.5 F (36.4 C)  TempSrc:   Oral Oral  SpO2: 100% 97% 95% 100%  Weight:      Height:         GEN: NAD SKIN: Warm and dry EYES: EOMI ENT: MMM CV: RRR PULM: CTA B ABD: soft, ND, NT, +BS CNS: AAO x 3, left facial droop, upper extremity weakness with contracture EXT: No edema or tenderness   The results of significant diagnostics from this hospitalization (including imaging, microbiology, ancillary and laboratory) are listed below for reference.     Procedures and Diagnostic Studies:   DG Chest Portable 1 View  Result Date: 03/21/2020 CLINICAL DATA:  Weakness EXAM: PORTABLE CHEST 1 VIEW COMPARISON:  None. FINDINGS: Cardiomegaly. Both lungs are clear. The visualized skeletal structures are unremarkable. IMPRESSION: Cardiomegaly without acute abnormality of the lungs in AP portable projection. Electronically Signed   By: 03/23/2020.D.  On: 03/21/2020 09:26     Labs:   Basic Metabolic Panel: Recent Labs  Lab 03/21/20 0841 03/21/20 0841 03/22/20 0758 03/23/20 0638  NA 136  --  139 140  K 4.5   < > 3.6 4.2  CL 98  --  106 107  CO2 25  --  27 24  GLUCOSE 128*  --  92 92  BUN 26*  --  23 16  CREATININE 1.30*  --  1.13* 1.09*  CALCIUM 9.4  --  8.1* 8.7*  MG  --   --   --  1.5*   < > = values in this interval not displayed.   GFR Estimated Creatinine Clearance: 23.8 mL/min (A) (by C-G formula based on SCr of 1.09 mg/dL (H)). Liver Function Tests: Recent Labs  Lab 03/21/20 0841  AST 34  ALT 14  ALKPHOS 53  BILITOT 1.2  PROT 7.5  ALBUMIN 3.3*   No results for input(s): LIPASE, AMYLASE in the last 168 hours. No  results for input(s): AMMONIA in the last 168 hours. Coagulation profile Recent Labs  Lab 03/21/20 1122  INR 1.4*    CBC: Recent Labs  Lab 03/21/20 0841 03/22/20 0758  WBC 9.7 6.6  HGB 11.9* 10.0*  HCT 37.5 30.3*  MCV 91.2 87.8  PLT 553* 385   Cardiac Enzymes: No results for input(s): CKTOTAL, CKMB, CKMBINDEX, TROPONINI in the last 168 hours. BNP: Invalid input(s): POCBNP CBG: No results for input(s): GLUCAP in the last 168 hours. D-Dimer No results for input(s): DDIMER in the last 72 hours. Hgb A1c No results for input(s): HGBA1C in the last 72 hours. Lipid Profile No results for input(s): CHOL, HDL, LDLCALC, TRIG, CHOLHDL, LDLDIRECT in the last 72 hours. Thyroid function studies No results for input(s): TSH, T4TOTAL, T3FREE, THYROIDAB in the last 72 hours.  Invalid input(s): FREET3 Anemia work up No results for input(s): VITAMINB12, FOLATE, FERRITIN, TIBC, IRON, RETICCTPCT in the last 72 hours. Microbiology Recent Results (from the past 240 hour(s))  SARS Coronavirus 2 by RT PCR (hospital order, performed in [facility] hospital lab) Nasopharyngeal Nasopharyngeal Swab     Status: None   Collection Time: 03/21/20 10:49 AM   Specimen: Nasopharyngeal Swab  Result Value Ref Range Status   SARS Coronavirus 2 NEGATIVE NEGATIVE Final    Comment: (NOTE) SARS-CoV-2 target nucleic acids are NOT DETECTED.  The SARS-CoV-2 RNA is generally detectable in upper and lower respiratory specimens during the acute phase of infection. The lowest concentration of SARS-CoV-2 viral copies this assay can detect is 250 copies / mL. A negative result does not preclude SARS-CoV-2 infection and should not be used as the sole basis for treatment or other patient management decisions.  A negative result may occur with improper specimen collection / handling, submission of specimen other than nasopharyngeal swab, presence of viral mutation(s) within the areas targeted by this assay, and  inadequate number of viral copies (<250 copies / mL). A negative result must be combined with clinical observations, patient history, and epidemiological information.  Fact Sheet for Patients:   BoilerBrush.com.cy  Fact Sheet for Healthcare Providers: https://pope.com/  This test is not yet approved or  cleared by the [place] FDA and has been authorized for detection and/or diagnosis of SARS-CoV-2 by FDA under an Emergency Use Authorization (EUA).  This EUA will remain in effect (meaning this test can be used) for the duration of the COVID-19 declaration under Section 564(b)(1) of the Act, 21 U.S.C. section 360bbb-3(b)(1), unless the  authorization is terminated or revoked sooner.  Performed at [facility], [address] [place], [place] [address]      Discharge Instructions:   Discharge Instructions    DIET - DYS 1   Complete by: As directed    Fluid consistency: Thin   Discharge instructions   Complete by: As directed    Follow up with physician at the nursing home within 3 days of discharge   Increase activity slowly   Complete by: As directed      Allergies as of 03/23/2020      Reactions   Sulfa Antibiotics    Doxycycline Nausea Only      Medication List    TAKE these medications   acetaminophen 325 MG tablet Commonly known as: TYLENOL Take 650 mg by mouth every 6 (six) hours as needed for fever.   amiodarone 200 MG tablet Commonly known as: PACERONE Take 1 tablet (200 mg total) by mouth 2 (two) times daily.   apixaban 2.5 MG Tabs tablet Commonly known as: ELIQUIS Take 1 tablet (2.5 mg total) by mouth 2 (two) times daily.   calcium-vitamin D 500-200 MG-UNIT tablet Commonly known as: OSCAL WITH D Take 1 tablet by mouth.   Cetirizine HCl 10 MG Caps Take by mouth.   hydrALAZINE 10 MG tablet Commonly known as: APRESOLINE Take 1 tablet (10 mg total) by mouth in the morning and at  bedtime.   ketoconazole 2 % cream Commonly known as: NIZORAL Apply 1 application topically daily.   loratadine 10 MG tablet Commonly known as: CLARITIN Take 1 tablet (10 mg total) by mouth daily.   metoprolol succinate 50 MG 24 hr tablet Commonly known as: TOPROL-XL Take 50 mg by mouth daily. Take with or immediately following a meal.   omeprazole 20 MG capsule Commonly known as: PRILOSEC Take 20 mg by mouth daily as needed (acid reflux).   pravastatin 40 MG tablet Commonly known as: PRAVACHOL Take 1 tablet (40 mg total) by mouth daily after supper.   vitamin B-12 1000 MCG tablet Commonly known as: CYANOCOBALAMIN Take 1,000 mcg by mouth daily.       Follow-up Information    Lamar Blinks, MD Follow up in 1 week(s).   Specialty: Cardiology Contact information: [address] [facility] [place] [address] [phone]                Time coordinating discharge: 28 minutes  Signed:  Lurene Shadow  [facility] 03/23/2020, 10:20 AM

## 2020-04-23 ENCOUNTER — Emergency Department: Payer: Medicare Other

## 2020-04-23 ENCOUNTER — Other Ambulatory Visit: Payer: Self-pay

## 2020-04-23 ENCOUNTER — Emergency Department
Admission: EM | Admit: 2020-04-23 | Discharge: 2020-04-24 | Disposition: A | Discharge status: Skilled Nursing Facility | Payer: Medicare Other | Attending: Emergency Medicine | Admitting: Emergency Medicine

## 2020-04-23 DIAGNOSIS — I5032 Chronic diastolic (congestive) heart failure: Secondary | ICD-10-CM | POA: Diagnosis not present

## 2020-04-23 DIAGNOSIS — Z79899 Other long term (current) drug therapy: Secondary | ICD-10-CM | POA: Insufficient documentation

## 2020-04-23 DIAGNOSIS — R29810 Facial weakness: Secondary | ICD-10-CM | POA: Insufficient documentation

## 2020-04-23 DIAGNOSIS — I130 Hypertensive heart and chronic kidney disease with heart failure and stage 1 through stage 4 chronic kidney disease, or unspecified chronic kidney disease: Secondary | ICD-10-CM | POA: Insufficient documentation

## 2020-04-23 DIAGNOSIS — N390 Urinary tract infection, site not specified: Secondary | ICD-10-CM | POA: Diagnosis not present

## 2020-04-23 DIAGNOSIS — R4781 Slurred speech: Secondary | ICD-10-CM | POA: Diagnosis not present

## 2020-04-23 DIAGNOSIS — R5383 Other fatigue: Secondary | ICD-10-CM | POA: Diagnosis present

## 2020-04-23 DIAGNOSIS — R55 Syncope and collapse: Secondary | ICD-10-CM | POA: Diagnosis not present

## 2020-04-23 DIAGNOSIS — N183 Chronic kidney disease, stage 3 unspecified: Secondary | ICD-10-CM | POA: Insufficient documentation

## 2020-04-23 DIAGNOSIS — Z7901 Long term (current) use of anticoagulants: Secondary | ICD-10-CM | POA: Insufficient documentation

## 2020-04-23 LAB — URINALYSIS, COMPLETE (UACMP) WITH MICROSCOPIC
Bilirubin Urine: NEGATIVE
Glucose, UA: NEGATIVE mg/dL
Ketones, ur: NEGATIVE mg/dL
Nitrite: POSITIVE — AB
Protein, ur: 30 mg/dL — AB
Specific Gravity, Urine: 1.015 (ref 1.005–1.030)
WBC, UA: >50 WBC/hpf — ABNORMAL HIGH (ref 0–5)
pH: 5 (ref 5.0–8.0)

## 2020-04-23 LAB — BASIC METABOLIC PANEL
Anion gap: 11 (ref 5–15)
BUN: 20 mg/dL (ref 8–23)
CO2: 27 mmol/L (ref 22–32)
Calcium: 8.7 mg/dL — ABNORMAL LOW (ref 8.9–10.3)
Chloride: 96 mmol/L — ABNORMAL LOW (ref 98–111)
Creatinine, Ser: 1.33 mg/dL — ABNORMAL HIGH (ref 0.44–1.00)
GFR calc Af Amer: 40 mL/min — ABNORMAL LOW (ref >60)
GFR calc non Af Amer: 34 mL/min — ABNORMAL LOW (ref >60)
Glucose, Bld: 132 mg/dL — ABNORMAL HIGH (ref 70–99)
Potassium: 5 mmol/L (ref 3.5–5.1)
Sodium: 134 mmol/L — ABNORMAL LOW (ref 135–145)

## 2020-04-23 LAB — CBC WITH DIFFERENTIAL/PLATELET
Abs Immature Granulocytes: 0.04 10*3/uL (ref 0.00–0.07)
Basophils Absolute: 0 10*3/uL (ref 0.0–0.1)
Basophils Relative: 0 %
Eosinophils Absolute: 0.2 10*3/uL (ref 0.0–0.5)
Eosinophils Relative: 3 %
HCT: 36.9 % (ref 36.0–46.0)
Hemoglobin: 11.6 g/dL — ABNORMAL LOW (ref 12.0–15.0)
Immature Granulocytes: 1 %
Lymphocytes Relative: 39 %
Lymphs Abs: 2.8 10*3/uL (ref 0.7–4.0)
MCH: 29.2 pg (ref 26.0–34.0)
MCHC: 31.4 g/dL (ref 30.0–36.0)
MCV: 92.9 fL (ref 80.0–100.0)
Monocytes Absolute: 0.4 10*3/uL (ref 0.1–1.0)
Monocytes Relative: 6 %
Neutro Abs: 3.7 10*3/uL (ref 1.7–7.7)
Neutrophils Relative %: 51 %
Platelets: 514 10*3/uL — ABNORMAL HIGH (ref 150–400)
RBC: 3.97 MIL/uL (ref 3.87–5.11)
RDW: 14.4 % (ref 11.5–15.5)
WBC: 7.1 10*3/uL (ref 4.0–10.5)
nRBC: 0 % (ref 0.0–0.2)

## 2020-04-23 LAB — TROPONIN I (HIGH SENSITIVITY): Troponin I (High Sensitivity): 16 ng/L (ref <18)

## 2020-04-23 MED ORDER — CEPHALEXIN 500 MG PO CAPS
500.0000 mg | ORAL_CAPSULE | Freq: Two times a day (BID) | ORAL | 0 refills | Status: AC
Start: 2020-04-23 — End: 2020-04-30

## 2020-04-23 MED ORDER — CEPHALEXIN 500 MG PO CAPS
500.0000 mg | ORAL_CAPSULE | Freq: Two times a day (BID) | ORAL | 0 refills | Status: DC
Start: 2020-04-23 — End: 2020-04-23

## 2020-04-23 MED ORDER — SODIUM CHLORIDE 0.9 % IV BOLUS
1000.0000 mL | Freq: Once | INTRAVENOUS | Status: AC
  Administered 2020-04-23: 1000 mL via INTRAVENOUS

## 2020-04-23 MED ORDER — SODIUM CHLORIDE 0.9 % IV SOLN
1.0000 g | Freq: Once | INTRAVENOUS | Status: AC
  Administered 2020-04-23: 1 g via INTRAVENOUS
  Filled 2020-04-23: qty 10

## 2020-04-23 MED ORDER — LACTATED RINGERS IV BOLUS: 1000.0000 mL | Freq: Once | INTRAVENOUS | Status: DC

## 2020-04-23 NOTE — ED Notes (Signed)
Reviewed discharge instructions, follow-up care, and prescriptions with patient family and facility rep Lola. Per family and facility request paper prescription sent with discharge instructions for antibiotics. Patient's family member and facility rep Lola verbalized understanding of all information reviewed. Patient stable, with no distress noted at this time.

## 2020-04-23 NOTE — ED Provider Notes (Signed)
[facility] Emergency Department Provider Note   ____________________________________________   First MD Initiated Contact with Patient 04/23/20 1524     (approximate)  I have reviewed the triage vital signs and the nursing notes.   HISTORY  Chief Complaint Fatigue and Near Syncope    HPI Sarah Delacruz is a 86 y.o. female with possible history of stroke and left-sided deficits, hypertension, CHF, CKD, and atrial fibrillation on Eliquis who presents to the ED following near syncopal episode.  Patient currently resides at [facility] and when her daughter went to visit her today patient was reportedly just waking up from a nap.  While talking with her daughter she seemed to be staring off into space and having a hard time responding.  This lasted for a couple seconds before she became more alert and appropriately responsive.  Initial BP at the facility was noted to be 90/63, however this quickly normalized without intervention.  Patient did not fully pass out and denies any complaints at this time.  She denies any chest pain or shortness of breath with this episode, states she has been feeling well recently with no fevers, cough, dysuria, hematuria, nausea, vomiting, or diarrhea.        Past Medical History:  Diagnosis Date  . Anemia   . Bundle branch block, left   . Chronic diastolic CHF (congestive heart failure) (HCC)   . CKD (chronic kidney disease), stage III   . GERD (gastroesophageal reflux disease)   . Hyperlipidemia   . Hypertension   . Pruritus   . Stroke (HCC)   . Valvular heart disease     Patient Active Problem List   Diagnosis Date Noted  . Hypotension 03/21/2020  . Acute embolic stroke (HCC) 03/02/2020  . Goals of care, counseling/discussion   . Palliative care by specialist   . DNR (do not resuscitate) discussion   . Cerebrovascular disease 03/01/2020  . HLD (hyperlipidemia) 03/01/2020  . Atrial fibrillation with RVR (HCC)  03/01/2020  . CKD (chronic kidney disease), stage IIIa 03/01/2020  . Depression 03/01/2020  . Hyponatremia 03/01/2020  . Hyperkalemia 03/01/2020  . Left hemiplegia (HCC) 12/05/2019  . Acute right MCA stroke (HCC) 11/05/2019  . Acute cerebral infarction (HCC)   . Stage 3 chronic kidney disease   . Chronic constipation   . Chronic diastolic congestive heart failure (HCC)   . Essential hypertension   . Dysphagia, post-stroke     History reviewed. No pertinent surgical history.  Prior to Admission medications   Medication Sig Start Date End Date Taking? Authorizing Provider  acetaminophen (TYLENOL) 325 MG tablet Take 650 mg by mouth every 6 (six) hours as needed for fever.    [provider]  amiodarone (PACERONE) 200 MG tablet Take 1 tablet (200 mg total) by mouth 2 (two) times daily. 03/23/20   Lurene Shadow, MD  apixaban (ELIQUIS) 2.5 MG TABS tablet Take 1 tablet (2.5 mg total) by mouth 2 (two) times daily. 03/04/20 04/03/20  Delfino Lovett, MD  calcium-vitamin D (OSCAL WITH D) 500-200 MG-UNIT tablet Take 1 tablet by mouth.    [provider]  cephALEXin (KEFLEX) 500 MG capsule Take 1 capsule (500 mg total) by mouth 2 (two) times daily for 7 days. 04/23/20 04/30/20  Chesley Noon, MD  Cetirizine HCl 10 MG CAPS Take by mouth.    [provider]  hydrALAZINE (APRESOLINE) 10 MG tablet Take 1 tablet (10 mg total) by mouth in the morning and at bedtime. 03/23/20  Lurene Shadow, MD  ketoconazole (NIZORAL) 2 % cream Apply 1 application topically daily. 03/12/20   [provider]  loratadine (CLARITIN) 10 MG tablet Take 1 tablet (10 mg total) by mouth daily. 12/06/19   Love, Evlyn Kanner, PA-C  metoprolol succinate (TOPROL-XL) 50 MG 24 hr tablet Take 50 mg by mouth daily. Take with or immediately following a meal.    [provider]  omeprazole (PRILOSEC) 20 MG capsule Take 20 mg by mouth daily as needed (acid reflux).     [provider]  pravastatin  (PRAVACHOL) 40 MG tablet Take 1 tablet (40 mg total) by mouth daily after supper. 12/05/19   Love, Evlyn Kanner, PA-C  vitamin B-12 (CYANOCOBALAMIN) 1000 MCG tablet Take 1,000 mcg by mouth daily.    [provider]    Allergies Sulfa antibiotics and Doxycycline  Family History  Problem Relation Age of Onset  . Cancer Mother   . Heart disease Sister     Social History Social History   Tobacco Use  . Smoking status: Never Smoker  . Smokeless tobacco: Never Used  Vaping Use  . Vaping Use: Never used  Substance Use Topics  . Alcohol use: Not Currently  . Drug use: Never    Review of Systems  Constitutional: No fever/chills Eyes: No visual changes. ENT: No sore throat. Cardiovascular: Denies chest pain.  Positive for near syncope. Respiratory: Denies shortness of breath. Gastrointestinal: No abdominal pain.  No nausea, no vomiting.  No diarrhea.  No constipation. Genitourinary: Negative for dysuria. Musculoskeletal: Negative for back pain. Skin: Negative for rash. Neurological: Negative for headaches, focal weakness or numbness.  ____________________________________________   PHYSICAL EXAM:  VITAL SIGNS: ED Triage Vitals [04/23/20 1525]  Enc Vitals Group     BP      Pulse      Resp      Temp      Temp src      SpO2      Weight      Height      Head Circumference      Peak Flow      Pain Score 0     Pain Loc      Pain Edu?      Excl. in GC?     Constitutional: Alert and oriented to person and place, but not time. Eyes: Conjunctivae are normal. Head: Atraumatic. Nose: No congestion/rhinnorhea. Mouth/Throat: Mucous membranes are moist. Neck: Normal ROM Cardiovascular: Normal rate, regular rhythm. Grossly normal heart sounds. Respiratory: Normal respiratory effort.  No retractions. Lungs CTAB. Gastrointestinal: Soft and nontender. No distention. Genitourinary: deferred Musculoskeletal: No lower extremity tenderness nor edema. Neurologic: Slurred  speech noted.  Left-sided facial droop, flaccid paralysis of left arm, and 4 out of 5 strength in left lower extremity.  Strength intact on right upper and lower extremities, at reported baseline. Skin:  Skin is warm, dry and intact. No rash noted. Psychiatric: Mood and affect are normal. Speech and behavior are normal.  ____________________________________________   LABS (all labs ordered are listed, but only abnormal results are displayed)  Labs Reviewed  CBC WITH DIFFERENTIAL/PLATELET - Abnormal; Notable for the following components:      Result Value   Hemoglobin 11.6 (*)    Platelets 514 (*)    All other components within normal limits  URINALYSIS, COMPLETE (UACMP) WITH MICROSCOPIC - Abnormal; Notable for the following components:   Color, Urine AMBER (*)    APPearance TURBID (*)    Hgb urine dipstick  SMALL (*)    Protein, ur 30 (*)    Nitrite POSITIVE (*)    Leukocytes,Ua LARGE (*)    WBC, UA >50 (*)    Bacteria, UA MANY (*)    All other components within normal limits  BASIC METABOLIC PANEL - Abnormal; Notable for the following components:   Sodium 134 (*)    Chloride 96 (*)    Glucose, Bld 132 (*)    Creatinine, Ser 1.33 (*)    Calcium 8.7 (*)    GFR calc non Af Amer 34 (*)    GFR calc Af Amer 40 (*)    All other components within normal limits  URINE CULTURE  TROPONIN I (HIGH SENSITIVITY)   ____________________________________________  EKG  ED ECG REPORT I, Chesley Noon, the attending physician, personally viewed and interpreted this ECG.   Date: 04/23/2020  EKG Time: 15:30  Rate: 55  Rhythm: sinus bradycardia  Axis: Normal  Intervals:first-degree A-V block  and left bundle branch block  ST&T Change: None   PROCEDURES  Procedure(s) performed (including Critical Care):  Procedures   ____________________________________________   INITIAL IMPRESSION / ASSESSMENT AND PLAN / ED COURSE       86 year old female with past medical history of stroke  and left-sided deficits, hypertension, CHF, CKD, and atrial fibrillation on Eliquis presents to the ED following episode where she was briefly awake but minimally responsive.  She has quickly returned to her baseline, does have left-sided neurologic deficits that are unchanged today.  She denies any complaints and did not have any chest pain or shortness of breath with this episode, states she has otherwise been feeling well recently.  EKG shows normal sinus rhythm with left bundle branch block similar to previous, no ischemic changes noted.  We will screen head CT, chest x-ray and labs including UA.  Patient is hemodynamically stable at this time.  CT head and chest x-ray are unremarkable, lab work reassuring.  UA does appear consistent with UTI and urine was sent for culture.  Patient was given a dose of Rocephin here in the ED and we will prescribe Keflex as her most recent culture grew E. coli that was sensitive to Ancef.  Patient and daughter counseled to have her follow-up with her PCP and otherwise return to the ED for new or worsening symptoms, patient agrees with plan.      ____________________________________________   FINAL CLINICAL IMPRESSION(S) / ED DIAGNOSES  Final diagnoses:  Lower urinary tract infectious disease  Near syncope     ED Discharge Orders         Ordered    cephALEXin (KEFLEX) 500 MG capsule  2 times daily        04/23/20 2122           Note:  This document was prepared using Dragon voice recognition software and may include unintentional dictation errors.   Chesley Noon, MD 04/23/20 2123

## 2020-04-23 NOTE — ED Triage Notes (Signed)
pt arrives via ems from [facility]. ems reports talking to daughter and she reported pt started staring off into space, then come out of it after a couple seconds. staff took BP and low at facility 90/63, then came up. EMS bp 171/49. A&O denies any complaints at this time. MD at bedside to assess. pt hx of left sided weakness and slurred speach from previous stroke. NAD noted at this time

## 2020-04-26 LAB — URINE CULTURE: Culture: >=100000 — AB

## 2020-04-27 NOTE — Progress Notes (Signed)
ED Antimicrobial Stewardship Positive Culture Follow Up   Sarah Delacruz is an 86 y.o. female who presented to [facility] on 04/23/2020 with a chief complaint of  Chief Complaint  Patient presents with  . Fatigue  . Near Syncope    Recent Results (from the past 720 hour(s))  Urine culture     Status: Abnormal   Collection Time: 04/23/20  3:31 PM   Specimen: Urine, Random  Result Value Ref Range Status   Specimen Description   Final    URINE, RANDOM Performed at [facility], [address] [place], [place] [address]    Special Requests   Final    NONE Performed at [facility], [address] [place], [place] [address]    Culture (A)  Final    >=100,000 COLONIES/mL ESCHERICHIA COLI >=100,000 COLONIES/mL ENTEROCOCCUS FAECALIS    Report Status 04/26/2020 FINAL  Final   Organism ID, Bacteria ESCHERICHIA COLI (A)  Final   Organism ID, Bacteria ENTEROCOCCUS FAECALIS (A)  Final      Susceptibility   Escherichia coli - MIC*    AMPICILLIN >=32 RESISTANT Resistant     CEFAZOLIN 8 SENSITIVE Sensitive     CEFTRIAXONE <=0.25 SENSITIVE Sensitive     CIPROFLOXACIN <=0.25 SENSITIVE Sensitive     GENTAMICIN <=1 SENSITIVE Sensitive     IMIPENEM <=0.25 SENSITIVE Sensitive     NITROFURANTOIN <=16 SENSITIVE Sensitive     TRIMETH/SULFA <=20 SENSITIVE Sensitive     AMPICILLIN/SULBACTAM >=32 RESISTANT Resistant     PIP/TAZO 64 INTERMEDIATE Intermediate     * >=100,000 COLONIES/mL ESCHERICHIA COLI   Enterococcus faecalis - MIC*    AMPICILLIN <=2 SENSITIVE Sensitive     NITROFURANTOIN <=16 SENSITIVE Sensitive     VANCOMYCIN 1 SENSITIVE Sensitive     * >=100,000 COLONIES/mL ENTEROCOCCUS FAECALIS    [x]  Treated with cephalexin, organism resistant to prescribed antimicrobial []  Patient discharged originally without antimicrobial agent and treatment is now indicated  HPI: Patient is a 86 y/o F who presented to the emergency department 8/27 from 85 with fatigue  / near syncope. Work-up was non-revealing except for UA suspicious for UTI. UA with many bacteria, 21-50 RBC, 21-50 squams, > 50 WBC. Given amount of squamous cells expect culture was contaminated. Patient denied dysuria, fevers, GI symptoms. Patient was discharged on cephalexin for presumed UTI. Urine culture has resulted E coli and E faecalis. Cephalexin without activity versus E faecalis. Discussed with EDP provider with plan to speak with nurse at [facility] and, if patient with signs/symptoms of infection, prescribe amoxicillin. Spoke with nurse at [facility] and she reports patient is doing well. No fevers or other signs/symptoms of UTI / infection.  New antibiotic prescription: None warranted  ED Provider: [facility]. [facility] 04/27/2020, 2:43 PM

## 2020-05-28 DEATH — deceased

## 2021-12-19 IMAGING — CT CT HEAD CODE STROKE
3 series · 14 of 47 positions shown, 16 images · non-contrast
Comparison: No pertinent prior studies available for comparison.

CLINICAL DATA: Code stroke. Left-sided weakness. Last known normal
9am.

EXAM:
CT HEAD WITHOUT CONTRAST
TECHNIQUE: Contiguous axial images were obtained from the base of the skull
through the vertex without intravenous contrast.

[Series 3: head 5.0 st · axial · 0.45mm/px · z∈[-78,+47]mm · 8 of 31 slices shown, 10 images]
[im 3/31  brain]
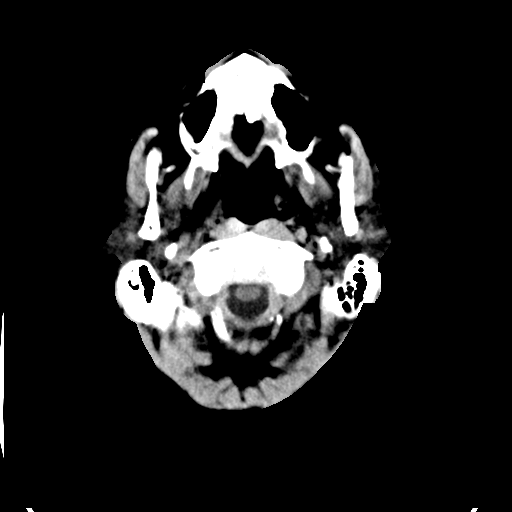
[im 3/31  bone]
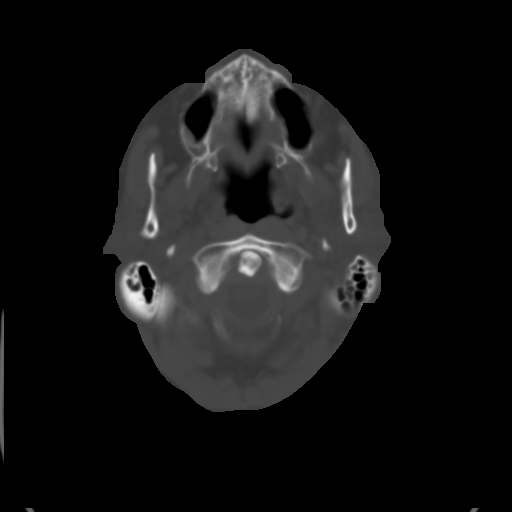
[im 7/31  brain]
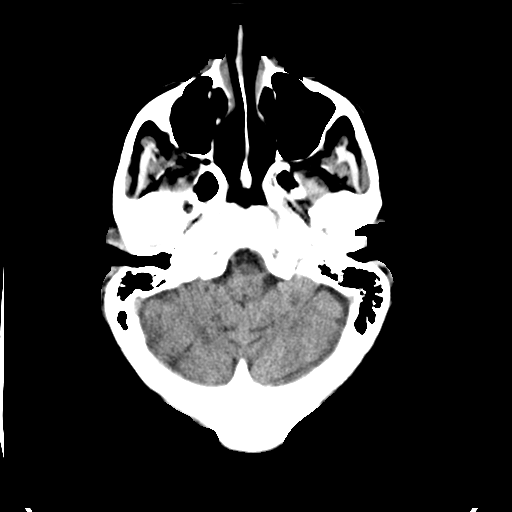
[im 10/31  brain]
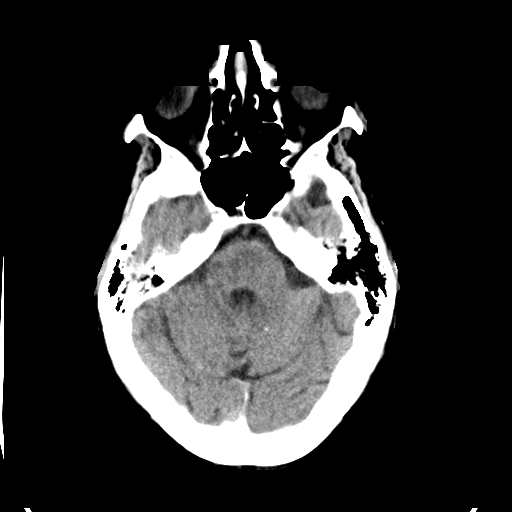
[im 14/31  brain]
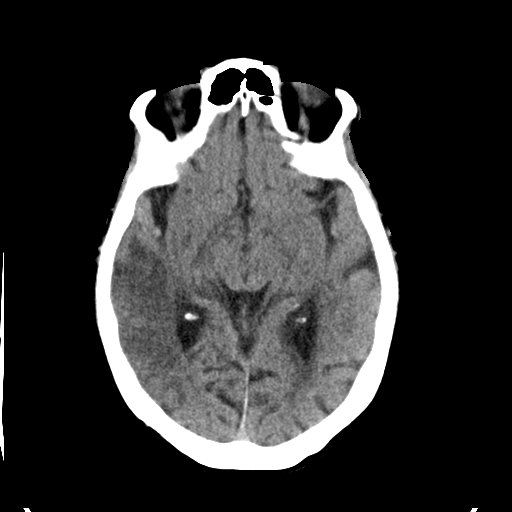
[im 17/31  brain]
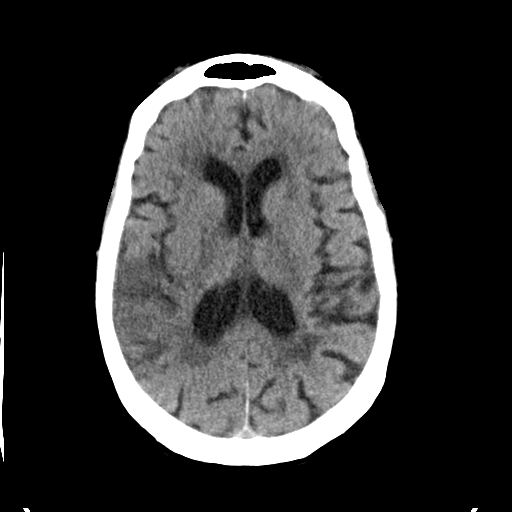
[im 17/31  bone]
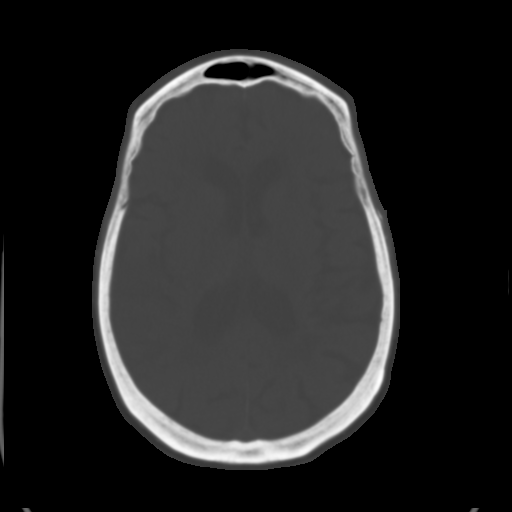
[im 21/31  brain]
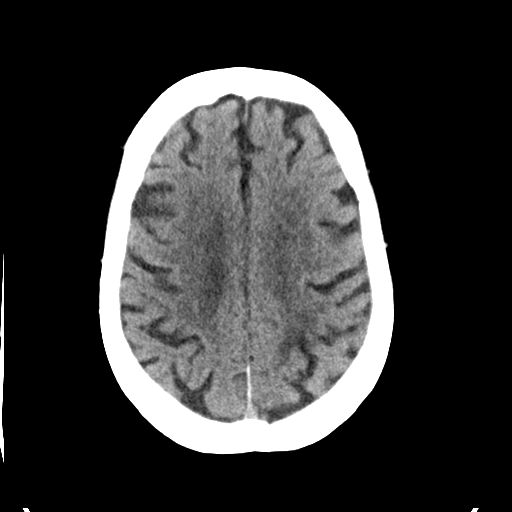
[im 24/31  brain]
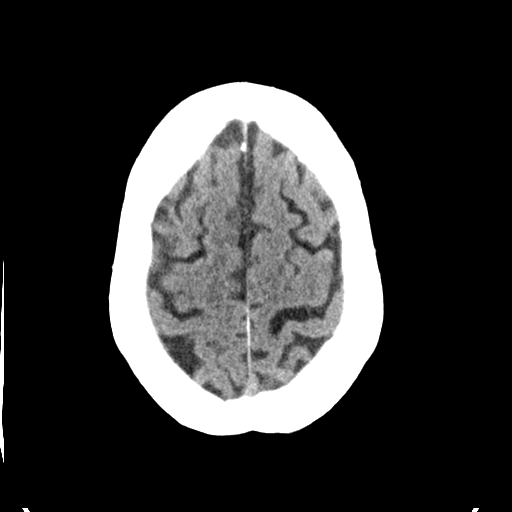
[im 28/31  brain]
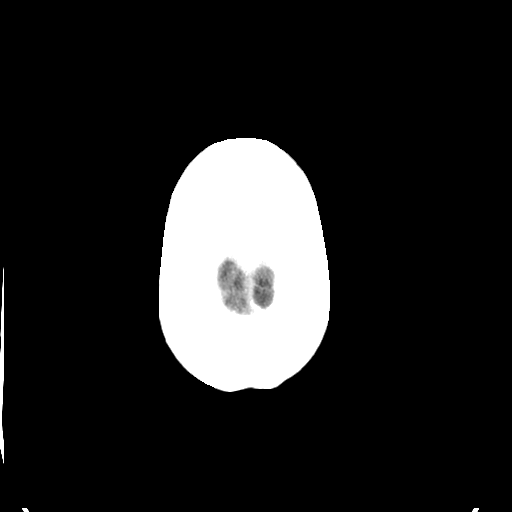

[Series 5: head 3.0 cor st · coronal · 0.31mm/px · 3 of 67 slices shown]
[im 23/67  brain]
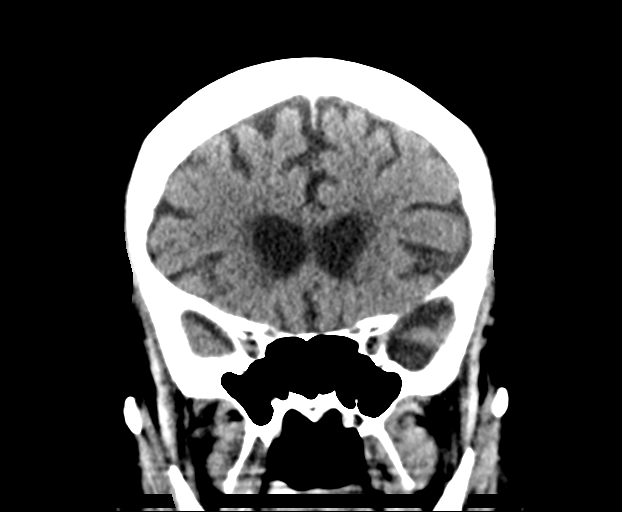
[im 30/67  brain]
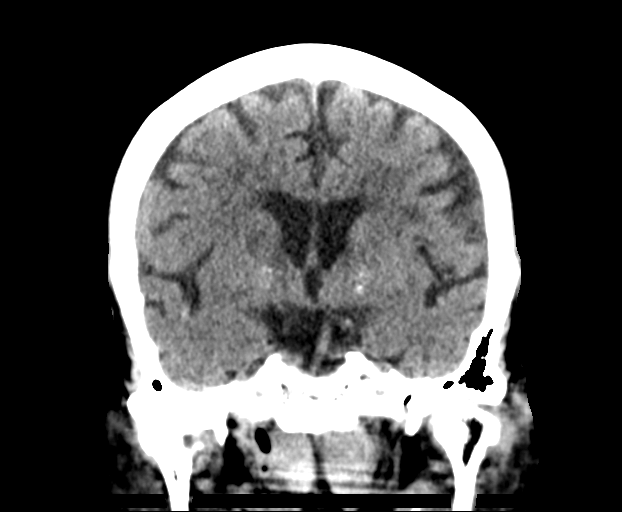
[im 37/67  brain]
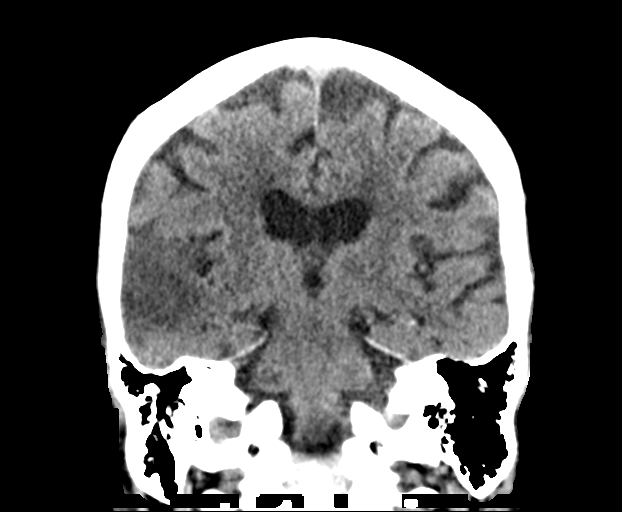

[Series 6: head 3.0 sag st · sagittal · 0.31mm/px · 3 of 67 slices shown]
[im 23/67  brain]
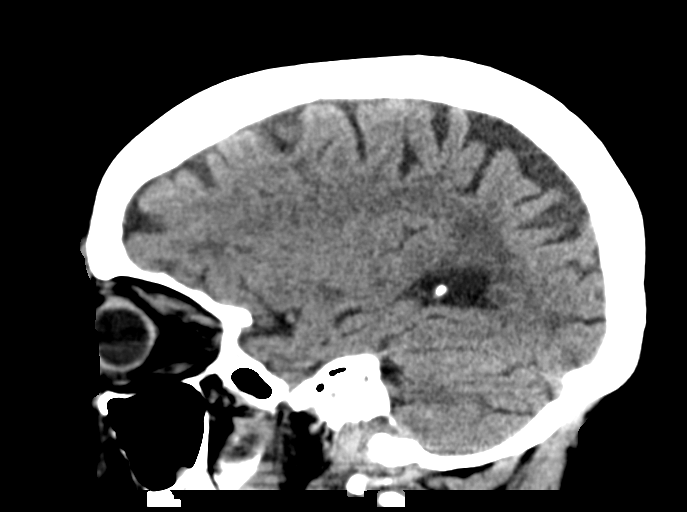
[im 34/67  brain]
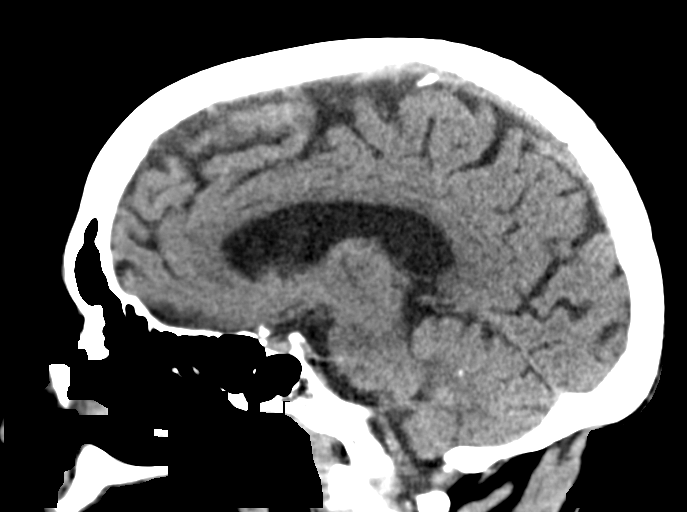
[im 45/67  brain]
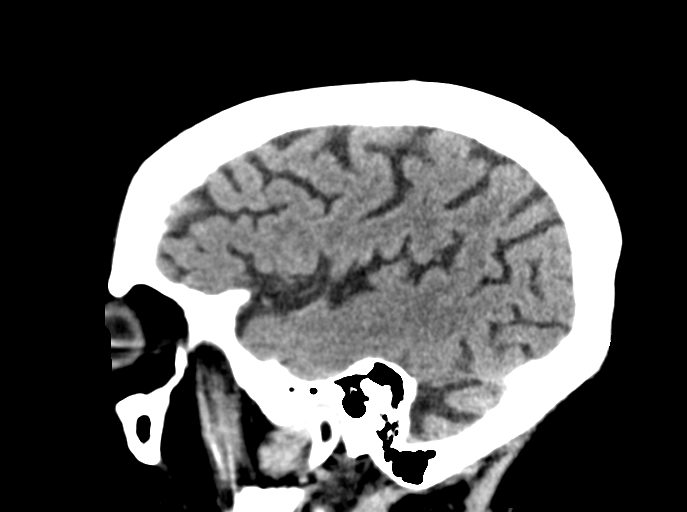

[14 of 47 positions shown; findings below may reference images not displayed]

FINDINGS: Brain:

There is cortical/subcortical hypodensity within the posterosuperior
right temporal lobe consistent with acute/early subacute infarction.
Small chronic appearing cortically based infarct within the mid
right frontal lobe (series 3, image 23). Additional small cortically
based infarct within the left parietal lobe (series 3, image 21).
Chronic lacunar infarct within the right cerebellum. There is no
evidence of acute intracranial hemorrhage. There is no evidence of
intracranial mass. No midline shift or extra-axial fluid collection.
Moderate/advanced ill-defined hypoattenuation within the cerebral
white matter is nonspecific, but consistent with chronic small
vessel ischemic disease. Moderate generalized parenchymal atrophy.

Vascular: No hyperdense vessel.  Atherosclerotic calcifications.

Skull: Normal. Negative for fracture or focal lesion.

Sinuses/Orbits: Rightward gaze. Mild paranasal sinus mucosal
thickening. No significant mastoid effusion

ASPECTS (Alberta Stroke Program Early CT Score)

- Ganglionic level infarction (caudate, lentiform nuclei, internal
capsule, insula, M1-M3 cortex): 5 (points deducted for involvement
of the M2 and M3 regions)

- Supraganglionic infarction (M4-M6 cortex): 3

Total score (0-10 with 10 being normal): 8

These results were called by telephone at the time of interpretation
on 10/29/2019 at [DATE] to provider Dr. [email or web address], who verbally
acknowledged these results.
IMPRESSION: Acute/early subacute ischemic infarction within the right MCA
vascular territory involving the posterosuperior temporal lobe.
ASPECTS 8.

Small chronic cortically based infarcts within the mid right frontal
lobe and left parietal lobe.

Moderate to advanced chronic small vessel ischemic disease. Chronic
appearing lacunar infarct within the right cerebellum.

## 2022-05-12 IMAGING — DX DG CHEST 1V PORT
1 series · 1 of 1 positions shown · non-contrast
Comparison: None.

CLINICAL DATA: Weakness

EXAM:
PORTABLE CHEST 1 VIEW

[chest ap]
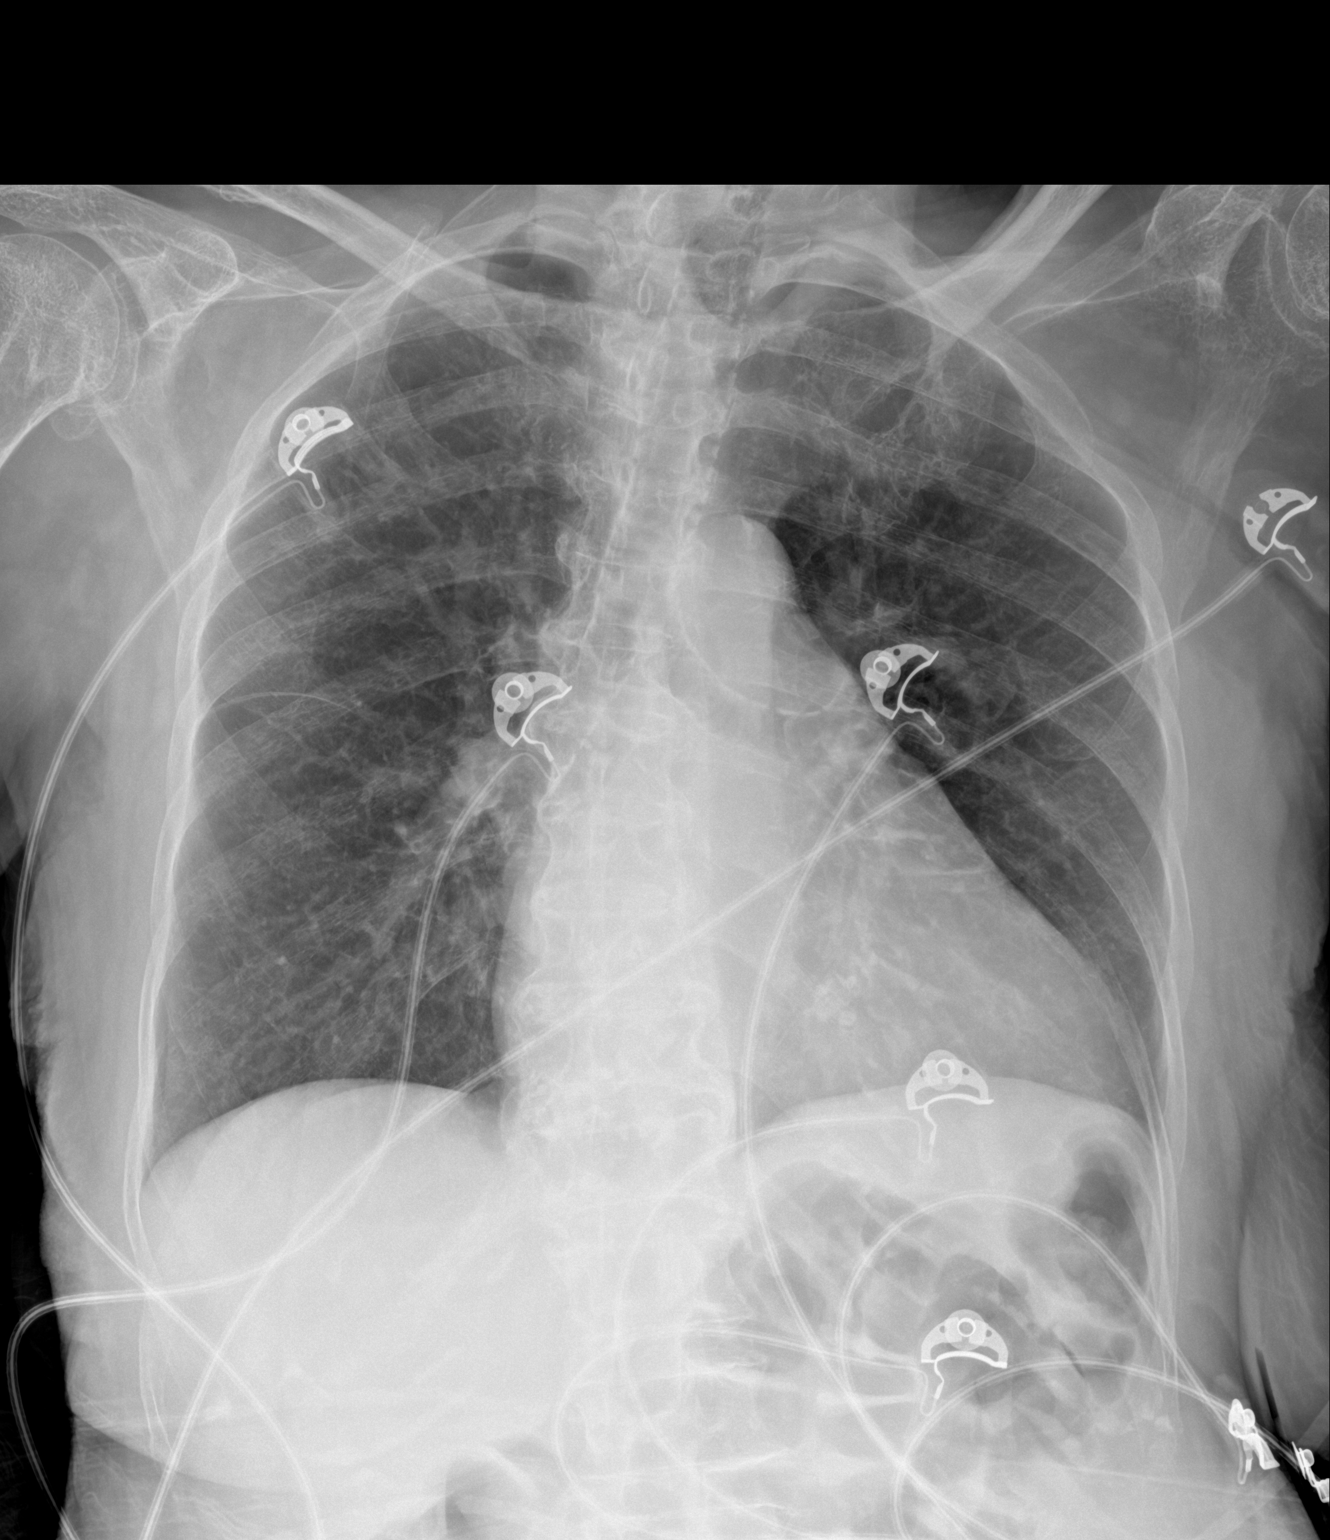

[1 of 1 positions shown; findings below may reference images not displayed]

FINDINGS: Cardiomegaly. Both lungs are clear. The visualized skeletal
structures are unremarkable.
IMPRESSION: Cardiomegaly without acute abnormality of the lungs in AP portable
projection.
